# Patient Record
Sex: Female | Born: 1985 | Race: Black or African American | Hispanic: No | Marital: Single | Smoking: Current every day smoker
Health system: Southern US, Community
[De-identification: ages and names within clinical notes are randomized; demographics above are authoritative.]

## PROBLEM LIST (undated history)

## (undated) ENCOUNTER — Ambulatory Visit: Discharge status: Absent without leave | Payer: 59

## (undated) DIAGNOSIS — I10 Essential (primary) hypertension: Secondary | ICD-10-CM

## (undated) DIAGNOSIS — E119 Type 2 diabetes mellitus without complications: Secondary | ICD-10-CM

## (undated) HISTORY — PX: NO PAST SURGERIES: SHX2092

---
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
## 2006-01-12 ENCOUNTER — Emergency Department: Payer: Self-pay | Admitting: Unknown Physician Specialty

## 2006-01-25 ENCOUNTER — Emergency Department: Payer: Self-pay | Admitting: Emergency Medicine

## 2006-01-25 ENCOUNTER — Other Ambulatory Visit: Payer: Self-pay

## 2006-03-05 ENCOUNTER — Emergency Department: Payer: Self-pay | Admitting: Emergency Medicine

## 2006-05-12 ENCOUNTER — Emergency Department (HOSPITAL_COMMUNITY): Admission: EM | Admit: 2006-05-12 | Discharge: 2006-05-12 | Payer: Self-pay | Admitting: Emergency Medicine

## 2006-06-26 ENCOUNTER — Emergency Department (HOSPITAL_COMMUNITY): Admission: EM | Admit: 2006-06-26 | Discharge: 2006-06-26 | Payer: Self-pay | Admitting: Emergency Medicine

## 2006-09-22 ENCOUNTER — Emergency Department (HOSPITAL_COMMUNITY): Admission: EM | Admit: 2006-09-22 | Discharge: 2006-09-22 | Payer: Self-pay | Admitting: Emergency Medicine

## 2007-02-21 ENCOUNTER — Emergency Department (HOSPITAL_COMMUNITY): Admission: EM | Admit: 2007-02-21 | Discharge: 2007-02-21 | Payer: Self-pay | Admitting: Emergency Medicine

## 2007-09-13 ENCOUNTER — Emergency Department (HOSPITAL_COMMUNITY): Admission: EM | Admit: 2007-09-13 | Discharge: 2007-09-13 | Payer: Self-pay | Admitting: Emergency Medicine

## 2007-09-15 ENCOUNTER — Inpatient Hospital Stay (HOSPITAL_COMMUNITY): Admission: AD | Admit: 2007-09-15 | Discharge: 2007-09-15 | Payer: Self-pay | Admitting: Gynecology

## 2007-11-15 ENCOUNTER — Emergency Department (HOSPITAL_COMMUNITY): Admission: EM | Admit: 2007-11-15 | Discharge: 2007-11-16 | Payer: Self-pay | Admitting: Emergency Medicine

## 2007-11-20 ENCOUNTER — Emergency Department (HOSPITAL_COMMUNITY): Admission: EM | Admit: 2007-11-20 | Discharge: 2007-11-20 | Payer: Self-pay | Admitting: Emergency Medicine

## 2008-07-12 ENCOUNTER — Emergency Department (HOSPITAL_COMMUNITY): Admission: EM | Admit: 2008-07-12 | Discharge: 2008-07-12 | Payer: Self-pay | Admitting: Emergency Medicine

## 2008-10-20 ENCOUNTER — Emergency Department (HOSPITAL_COMMUNITY): Admission: EM | Admit: 2008-10-20 | Discharge: 2008-10-20 | Payer: Self-pay | Admitting: Emergency Medicine

## 2009-01-18 ENCOUNTER — Emergency Department (HOSPITAL_BASED_OUTPATIENT_CLINIC_OR_DEPARTMENT_OTHER): Admission: EM | Admit: 2009-01-18 | Discharge: 2009-01-18 | Payer: Self-pay | Admitting: Emergency Medicine

## 2009-01-28 ENCOUNTER — Emergency Department (HOSPITAL_COMMUNITY): Admission: EM | Admit: 2009-01-28 | Discharge: 2009-01-29 | Payer: Self-pay | Admitting: *Deleted

## 2009-02-21 ENCOUNTER — Emergency Department (HOSPITAL_BASED_OUTPATIENT_CLINIC_OR_DEPARTMENT_OTHER): Admission: EM | Admit: 2009-02-21 | Discharge: 2009-02-21 | Payer: Self-pay | Admitting: Emergency Medicine

## 2009-03-19 ENCOUNTER — Emergency Department (HOSPITAL_BASED_OUTPATIENT_CLINIC_OR_DEPARTMENT_OTHER): Admission: EM | Admit: 2009-03-19 | Discharge: 2009-03-19 | Payer: Self-pay | Admitting: Emergency Medicine

## 2009-08-21 ENCOUNTER — Emergency Department (HOSPITAL_BASED_OUTPATIENT_CLINIC_OR_DEPARTMENT_OTHER): Admission: EM | Admit: 2009-08-21 | Discharge: 2009-08-21 | Payer: Self-pay | Admitting: Emergency Medicine

## 2009-11-10 ENCOUNTER — Emergency Department (HOSPITAL_COMMUNITY): Admission: EM | Admit: 2009-11-10 | Discharge: 2009-11-10 | Payer: Self-pay | Admitting: Emergency Medicine

## 2010-02-26 ENCOUNTER — Emergency Department (HOSPITAL_BASED_OUTPATIENT_CLINIC_OR_DEPARTMENT_OTHER): Admission: EM | Admit: 2010-02-26 | Discharge: 2010-02-26 | Payer: Self-pay | Admitting: Emergency Medicine

## 2010-03-31 ENCOUNTER — Emergency Department (HOSPITAL_COMMUNITY): Admission: EM | Admit: 2010-03-31 | Discharge: 2010-04-01 | Payer: Self-pay | Admitting: Emergency Medicine

## 2010-04-03 ENCOUNTER — Emergency Department (HOSPITAL_COMMUNITY): Admission: EM | Admit: 2010-04-03 | Discharge: 2010-04-03 | Payer: Self-pay | Admitting: Emergency Medicine

## 2010-09-12 ENCOUNTER — Ambulatory Visit: Payer: Self-pay | Admitting: Gynecology

## 2010-09-12 ENCOUNTER — Inpatient Hospital Stay (HOSPITAL_COMMUNITY): Admission: AD | Admit: 2010-09-12 | Discharge: 2010-09-12 | Payer: Self-pay | Admitting: Family Medicine

## 2010-09-21 ENCOUNTER — Ambulatory Visit: Payer: Self-pay | Admitting: Nurse Practitioner

## 2010-09-21 ENCOUNTER — Inpatient Hospital Stay (HOSPITAL_COMMUNITY): Admission: AD | Admit: 2010-09-21 | Discharge: 2010-09-21 | Payer: Self-pay | Admitting: Family Medicine

## 2011-01-20 LAB — URINE MICROSCOPIC-ADD ON

## 2011-01-20 LAB — URINALYSIS, ROUTINE W REFLEX MICROSCOPIC
Nitrite: NEGATIVE
Protein, ur: NEGATIVE mg/dL
Urobilinogen, UA: 1 mg/dL (ref 0.0–1.0)

## 2011-01-20 LAB — CBC
Hemoglobin: 8.3 g/dL — ABNORMAL LOW (ref 12.0–15.0)
MCH: 27.9 pg (ref 26.0–34.0)
MCH: 28.3 pg (ref 26.0–34.0)
MCHC: 32.3 g/dL (ref 30.0–36.0)
Platelets: 520 10*3/uL — ABNORMAL HIGH (ref 150–400)
RBC: 2.93 MIL/uL — ABNORMAL LOW (ref 3.87–5.11)
RBC: 3.01 MIL/uL — ABNORMAL LOW (ref 3.87–5.11)

## 2011-01-20 LAB — WET PREP, GENITAL: Yeast Wet Prep HPF POC: NONE SEEN

## 2011-01-20 LAB — GC/CHLAMYDIA PROBE AMP, GENITAL: GC Probe Amp, Genital: NEGATIVE

## 2011-01-23 ENCOUNTER — Emergency Department (HOSPITAL_COMMUNITY)
Admission: EM | Admit: 2011-01-23 | Discharge: 2011-01-23 | Disposition: A | Discharge status: Home / Self Care | Payer: Self-pay | Attending: Emergency Medicine | Admitting: Emergency Medicine

## 2011-01-23 ENCOUNTER — Emergency Department (HOSPITAL_COMMUNITY): Payer: Self-pay

## 2011-01-23 DIAGNOSIS — R059 Cough, unspecified: Secondary | ICD-10-CM | POA: Insufficient documentation

## 2011-01-23 DIAGNOSIS — R05 Cough: Secondary | ICD-10-CM | POA: Insufficient documentation

## 2011-01-23 DIAGNOSIS — J209 Acute bronchitis, unspecified: Secondary | ICD-10-CM | POA: Insufficient documentation

## 2011-01-25 LAB — PREGNANCY, URINE: Preg Test, Ur: NEGATIVE

## 2011-01-26 LAB — DIFFERENTIAL
Lymphocytes Relative: 19 % (ref 12–46)
Lymphs Abs: 1.9 10*3/uL (ref 0.7–4.0)
Monocytes Absolute: 0.9 10*3/uL (ref 0.1–1.0)
Monocytes Relative: 9 % (ref 3–12)
Neutro Abs: 6.8 10*3/uL (ref 1.7–7.7)

## 2011-01-26 LAB — POCT I-STAT, CHEM 8
BUN: 6 mg/dL (ref 6–23)
Calcium, Ion: 1.06 mmol/L — ABNORMAL LOW (ref 1.12–1.32)
Chloride: 105 mEq/L (ref 96–112)
Glucose, Bld: 94 mg/dL (ref 70–99)

## 2011-01-26 LAB — CBC
Hemoglobin: 12.7 g/dL (ref 12.0–15.0)
Hemoglobin: 13.3 g/dL (ref 12.0–15.0)
Platelets: 343 10*3/uL (ref 150–400)
RBC: 4.65 MIL/uL (ref 3.87–5.11)
RDW: 13.7 % (ref 11.5–15.5)

## 2011-01-27 LAB — URINALYSIS, ROUTINE W REFLEX MICROSCOPIC
Bilirubin Urine: NEGATIVE
Glucose, UA: NEGATIVE mg/dL
Hgb urine dipstick: NEGATIVE
Ketones, ur: NEGATIVE mg/dL
Nitrite: NEGATIVE
Protein, ur: NEGATIVE mg/dL
Specific Gravity, Urine: 1.025 (ref 1.005–1.030)
Urobilinogen, UA: 1 mg/dL (ref 0.0–1.0)
pH: 8 (ref 5.0–8.0)

## 2011-01-27 LAB — GC/CHLAMYDIA PROBE AMP, GENITAL
Chlamydia, DNA Probe: NEGATIVE
GC Probe Amp, Genital: NEGATIVE

## 2011-01-27 LAB — URINE MICROSCOPIC-ADD ON

## 2011-01-27 LAB — WET PREP, GENITAL
Trich, Wet Prep: NONE SEEN
Yeast Wet Prep HPF POC: NONE SEEN

## 2011-01-27 LAB — PREGNANCY, URINE: Preg Test, Ur: NEGATIVE

## 2011-01-27 LAB — RPR: RPR Ser Ql: NONREACTIVE

## 2011-02-17 LAB — GC/CHLAMYDIA PROBE AMP, GENITAL
Chlamydia, DNA Probe: NEGATIVE
GC Probe Amp, Genital: NEGATIVE

## 2011-02-17 LAB — URINALYSIS, ROUTINE W REFLEX MICROSCOPIC
Bilirubin Urine: NEGATIVE
Glucose, UA: NEGATIVE mg/dL
Hgb urine dipstick: NEGATIVE
Specific Gravity, Urine: 1.025 (ref 1.005–1.030)
Urobilinogen, UA: 1 mg/dL (ref 0.0–1.0)
pH: 7.5 (ref 5.0–8.0)

## 2011-02-17 LAB — URINE MICROSCOPIC-ADD ON

## 2011-02-17 LAB — RPR: RPR Ser Ql: NONREACTIVE

## 2011-02-18 LAB — URINALYSIS, ROUTINE W REFLEX MICROSCOPIC
Glucose, UA: NEGATIVE mg/dL
Hgb urine dipstick: NEGATIVE
Specific Gravity, Urine: 1.023 (ref 1.005–1.030)
pH: 6 (ref 5.0–8.0)

## 2011-02-19 LAB — RAPID STREP SCREEN (MED CTR MEBANE ONLY): Streptococcus, Group A Screen (Direct): NEGATIVE

## 2011-03-06 ENCOUNTER — Emergency Department (HOSPITAL_COMMUNITY)
Admission: EM | Admit: 2011-03-06 | Discharge: 2011-03-06 | Disposition: A | Discharge status: Home / Self Care | Payer: Self-pay | Attending: Emergency Medicine | Admitting: Emergency Medicine

## 2011-03-06 ENCOUNTER — Emergency Department (HOSPITAL_COMMUNITY): Payer: Self-pay

## 2011-03-06 DIAGNOSIS — R05 Cough: Secondary | ICD-10-CM | POA: Insufficient documentation

## 2011-03-06 DIAGNOSIS — J329 Chronic sinusitis, unspecified: Secondary | ICD-10-CM | POA: Insufficient documentation

## 2011-03-06 DIAGNOSIS — R059 Cough, unspecified: Secondary | ICD-10-CM | POA: Insufficient documentation

## 2011-03-06 DIAGNOSIS — R509 Fever, unspecified: Secondary | ICD-10-CM | POA: Insufficient documentation

## 2011-03-06 DIAGNOSIS — J069 Acute upper respiratory infection, unspecified: Secondary | ICD-10-CM | POA: Insufficient documentation

## 2011-03-06 LAB — POCT PREGNANCY, URINE: Preg Test, Ur: NEGATIVE

## 2011-07-30 LAB — I-STAT 8, (EC8 V) (CONVERTED LAB)
BUN: 8
Bicarbonate: 25 — ABNORMAL HIGH
HCT: 45
Hemoglobin: 15.3 — ABNORMAL HIGH
Operator id: 151321
Sodium: 139
pCO2, Ven: 42.1 — ABNORMAL LOW

## 2011-07-30 LAB — DIFFERENTIAL
Basophils Absolute: 0
Eosinophils Absolute: 0.2
Lymphocytes Relative: 33
Lymphs Abs: 1.6
Neutrophils Relative %: 50

## 2011-07-30 LAB — CBC
MCV: 84.7
Platelets: 380
RDW: 13.5
WBC: 5

## 2011-07-30 LAB — POCT PREGNANCY, URINE: Operator id: 282201

## 2011-08-14 LAB — WET PREP, GENITAL
Trich, Wet Prep: NONE SEEN
Yeast Wet Prep HPF POC: NONE SEEN

## 2011-08-14 LAB — URINALYSIS, ROUTINE W REFLEX MICROSCOPIC
Bilirubin Urine: NEGATIVE
Glucose, UA: NEGATIVE mg/dL
Hgb urine dipstick: NEGATIVE
Ketones, ur: NEGATIVE mg/dL
Nitrite: NEGATIVE
Specific Gravity, Urine: 1.02 (ref 1.005–1.030)
pH: 5.5 (ref 5.0–8.0)

## 2011-08-14 LAB — PREGNANCY, URINE: Preg Test, Ur: NEGATIVE

## 2011-08-14 LAB — GC/CHLAMYDIA PROBE AMP, GENITAL: Chlamydia, DNA Probe: NEGATIVE

## 2011-08-18 LAB — PREGNANCY, URINE: Preg Test, Ur: NEGATIVE

## 2011-11-15 ENCOUNTER — Emergency Department (HOSPITAL_COMMUNITY)
Admission: EM | Admit: 2011-11-15 | Discharge: 2011-11-15 | Disposition: A | Discharge status: Home / Self Care | Payer: Self-pay | Attending: Emergency Medicine | Admitting: Emergency Medicine

## 2011-11-15 ENCOUNTER — Encounter: Payer: Self-pay | Admitting: Emergency Medicine

## 2011-11-15 DIAGNOSIS — J3489 Other specified disorders of nose and nasal sinuses: Secondary | ICD-10-CM | POA: Insufficient documentation

## 2011-11-15 DIAGNOSIS — R197 Diarrhea, unspecified: Secondary | ICD-10-CM | POA: Insufficient documentation

## 2011-11-15 DIAGNOSIS — R1110 Vomiting, unspecified: Secondary | ICD-10-CM | POA: Insufficient documentation

## 2011-11-15 DIAGNOSIS — R5381 Other malaise: Secondary | ICD-10-CM | POA: Insufficient documentation

## 2011-11-15 DIAGNOSIS — R5383 Other fatigue: Secondary | ICD-10-CM | POA: Insufficient documentation

## 2011-11-15 DIAGNOSIS — H9209 Otalgia, unspecified ear: Secondary | ICD-10-CM | POA: Insufficient documentation

## 2011-11-15 DIAGNOSIS — R51 Headache: Secondary | ICD-10-CM | POA: Insufficient documentation

## 2011-11-15 DIAGNOSIS — R05 Cough: Secondary | ICD-10-CM | POA: Insufficient documentation

## 2011-11-15 DIAGNOSIS — R059 Cough, unspecified: Secondary | ICD-10-CM | POA: Insufficient documentation

## 2011-11-15 DIAGNOSIS — R109 Unspecified abdominal pain: Secondary | ICD-10-CM | POA: Insufficient documentation

## 2011-11-15 DIAGNOSIS — R6889 Other general symptoms and signs: Secondary | ICD-10-CM | POA: Insufficient documentation

## 2011-11-15 DIAGNOSIS — R000 Tachycardia, unspecified: Secondary | ICD-10-CM | POA: Insufficient documentation

## 2011-11-15 DIAGNOSIS — R070 Pain in throat: Secondary | ICD-10-CM | POA: Insufficient documentation

## 2011-11-15 DIAGNOSIS — R509 Fever, unspecified: Secondary | ICD-10-CM | POA: Insufficient documentation

## 2011-11-15 DIAGNOSIS — R079 Chest pain, unspecified: Secondary | ICD-10-CM | POA: Insufficient documentation

## 2011-11-15 DIAGNOSIS — IMO0001 Reserved for inherently not codable concepts without codable children: Secondary | ICD-10-CM | POA: Insufficient documentation

## 2011-11-15 MED ORDER — SALINE NASAL SPRAY 0.65 % NA SOLN: 1.0000 | NASAL | Status: DC | PRN

## 2011-11-15 MED ORDER — HYDROCODONE-HOMATROPINE 5-1.5 MG/5ML PO SYRP: 5.0000 mL | ORAL_SOLUTION | Freq: Four times a day (QID) | ORAL | Status: AC | PRN

## 2011-11-15 NOTE — ED Notes (Signed)
PT. REPORTS NASAL CONGESTION , PRODUCTIVE COUGH AND ABDOMINAL CRAMPING FOR 1 WEEK.

## 2011-11-15 NOTE — ED Provider Notes (Signed)
History     CSN: [id number]  Arrival date & time 11/15/11  2128   First MD Initiated Contact with Patient 11/15/11 2243      Chief Complaint  Patient presents with  . Nasal Congestion    (Consider location/radiation/quality/duration/timing/severity/associated sxs/prior treatment) HPI Comments: Pt presents to the ED with complaints of flu-like symptoms of cough, congestion, sore throat, muscle aches, chills, fevers, ear pain, headaches, abdominal pain, vomiting, diarrhea. The patient states that the symptoms started 1 week ago.  Pt did not get the flu shot this year. The patient denies headaches, neck pain, weakness, vision changes, severe abdominal pain, inability to eat or drink, difficulty breathing, SOB, wheezing, chest pain. The patient has tried cough medicine, NSAIDS, and rest but has only felt mild relief.    The history is provided by the patient.    History reviewed. No pertinent past medical history.  History reviewed. No pertinent past surgical history.  No family history on file.  History  Substance Use Topics  . Smoking status: Current Everyday Smoker  . Smokeless tobacco: Not on file  . Alcohol Use: Yes    OB History    Grav Para Term Preterm Abortions TAB SAB Ect Mult Living                  Review of Systems  Constitutional: Positive for fever, chills and fatigue.  HENT: Positive for congestion and rhinorrhea. Negative for ear pain, sore throat, sneezing, neck pain, neck stiffness, sinus pressure and tinnitus.   Eyes: Negative for visual disturbance.  Respiratory: Positive for cough and chest tightness.   Cardiovascular: Negative for chest pain and palpitations.  Gastrointestinal: Negative for nausea, vomiting, abdominal pain and diarrhea.  Genitourinary: Negative for dysuria.  Musculoskeletal: Positive for myalgias.  Skin: Negative for color change and rash.  Neurological: Negative for dizziness and weakness.  Hematological: Does not bruise/bleed  easily.  Psychiatric/Behavioral: Negative for confusion.  All other systems reviewed and are negative.    Allergies  Review of patient's allergies indicates no known allergies.  Home Medications   Current Outpatient Rx  Name Route Sig Dispense Refill  . GUAIFENESIN ER 1200 MG PO TB12 Oral Take 1 tablet by mouth daily as needed. For chest congestion       BP 151/87  Pulse 106  Temp(Src) 98.3 F (36.8 C) (Oral)  Resp 18  SpO2 98%  LMP 10/15/2011  Physical Exam  Nursing note and vitals reviewed. Constitutional: She is oriented to person, place, and time. She appears well-nourished. No distress.  HENT:  Head: Normocephalic and atraumatic. No trismus in the jaw.  Right Ear: External ear normal. No drainage or tenderness. No mastoid tenderness.  Left Ear: External ear normal. No drainage or tenderness. No mastoid tenderness.  Nose: Nose normal. No rhinorrhea or sinus tenderness.  Mouth/Throat: Uvula is midline, oropharynx is clear and moist and mucous membranes are normal. No uvula swelling. No oropharyngeal exudate.  Eyes: Conjunctivae and EOM are normal. Right eye exhibits no discharge. Left eye exhibits no discharge. No scleral icterus.  Neck: Normal range of motion. Neck supple.  Cardiovascular: Regular rhythm and normal heart sounds.  Tachycardia present.   Pulmonary/Chest: Effort normal and breath sounds normal. No stridor. No respiratory distress. She has no wheezes. She exhibits tenderness.  Abdominal: Soft. Normal appearance and bowel sounds are normal. There is no tenderness.  Musculoskeletal: Normal range of motion.  Neurological: She is alert and oriented to person, place, and time.  Skin: Skin is  warm and dry. No rash noted. She is not diaphoretic.  Psychiatric: She has a normal mood and affect. Her behavior is normal.    ED Course  Procedures (including critical care time)  Labs Reviewed - No data to display No results found.   No diagnosis found.  Pt  slightly tachycardic at triage. Nurse rechecking pts vitals. Pt HR at re-check is 94.   MDM  Flu like symptoms  Patient presents with flulike symptoms.  Due to patient's presentation and physical exam a chest x-ray was not ordered bc likely diagnosis of flu.  Discussed the cost versus benefit of Tamiflu treatment with the patient.  The patient understands that symptoms are greater than the recommended 24-48 hour window of treatment.  Patient will be discharged with instructions to orally hydrate, rest, and use over-the-counter medications such as anti-inflammatories ibuprofen and Aleve for muscle aches and Tylenol for fever.  Patient will also be given a cough suppressant.       [place], [place] 11/15/11 2307

## 2011-11-18 NOTE — ED Provider Notes (Signed)
Medical screening examination/treatment/procedure(s) were performed by non-physician practitioner and as supervising physician I was immediately available for consultation/collaboration.    Mckinley Olheiser L Miquan Tandon, MD 11/18/11 0600 

## 2011-11-30 ENCOUNTER — Emergency Department (HOSPITAL_COMMUNITY): Payer: Self-pay

## 2011-11-30 ENCOUNTER — Emergency Department (HOSPITAL_COMMUNITY)
Admission: EM | Admit: 2011-11-30 | Discharge: 2011-12-01 | Disposition: A | Discharge status: Home / Self Care | Payer: Self-pay | Attending: Emergency Medicine | Admitting: Emergency Medicine

## 2011-11-30 ENCOUNTER — Encounter (HOSPITAL_COMMUNITY): Payer: Self-pay | Admitting: *Deleted

## 2011-11-30 DIAGNOSIS — J111 Influenza due to unidentified influenza virus with other respiratory manifestations: Secondary | ICD-10-CM | POA: Insufficient documentation

## 2011-11-30 DIAGNOSIS — R05 Cough: Secondary | ICD-10-CM | POA: Insufficient documentation

## 2011-11-30 DIAGNOSIS — R059 Cough, unspecified: Secondary | ICD-10-CM | POA: Insufficient documentation

## 2011-11-30 DIAGNOSIS — R062 Wheezing: Secondary | ICD-10-CM | POA: Insufficient documentation

## 2011-11-30 DIAGNOSIS — R0989 Other specified symptoms and signs involving the circulatory and respiratory systems: Secondary | ICD-10-CM | POA: Insufficient documentation

## 2011-11-30 DIAGNOSIS — R509 Fever, unspecified: Secondary | ICD-10-CM | POA: Insufficient documentation

## 2011-11-30 DIAGNOSIS — R6889 Other general symptoms and signs: Secondary | ICD-10-CM | POA: Insufficient documentation

## 2011-11-30 DIAGNOSIS — I10 Essential (primary) hypertension: Secondary | ICD-10-CM | POA: Insufficient documentation

## 2011-11-30 HISTORY — DX: Essential (primary) hypertension: I10

## 2011-11-30 NOTE — ED Notes (Signed)
The pt has been coughing for one week and she was seen here last week for the same.  For 2 days she has chest congestion  And drainage from her nose.

## 2011-12-01 MED ORDER — ALBUTEROL SULFATE HFA 108 (90 BASE) MCG/ACT IN AERS
2.0000 | INHALATION_SPRAY | Freq: Once | RESPIRATORY_TRACT | Status: AC
  Administered 2011-12-01: 2 via RESPIRATORY_TRACT
  Filled 2011-12-01: qty 6.7

## 2011-12-01 NOTE — ED Provider Notes (Signed)
History     CSN: [id number]  Arrival date & time 11/30/11  2219   First MD Initiated Contact with Patient 12/01/11 0132      Chief Complaint  Patient presents with  . Influenza    (Consider location/radiation/quality/duration/timing/severity/associated sxs/prior treatment) HPI Comments: Patient is a 26 year old female with no significant past medical history other than mild hypertension who presents approximately one week after being seen for a cough and chest congestion. She was initially diagnosed with a flulike illness. She states that for the last 2 days she has had persistent cough and chest congestion and a clear runny watery drainage from her nose. She states she has had subjective fevers and chills but denies nausea vomiting back pain belly pain lower extremity swelling, rash, headache. Symptoms are persistent, moderate, nothing makes better or worse.  Patient is a 26 y.o. female presenting with flu symptoms. The history is provided by the patient and a friend.  Influenza    Past Medical History  Diagnosis Date  . Hypertension     History reviewed. No pertinent past surgical history.  History reviewed. No pertinent family history.  History  Substance Use Topics  . Smoking status: Current Everyday Smoker  . Smokeless tobacco: Not on file  . Alcohol Use: Yes    OB History    Grav Para Term Preterm Abortions TAB SAB Ect Mult Living                  Review of Systems  All other systems reviewed and are negative.    Allergies  Review of patient's allergies indicates no known allergies.  Home Medications   Current Outpatient Rx  Name Route Sig Dispense Refill  . DM-PHENYLEPHRINE-ACETAMINOPHEN 10-5-325 MG/15ML PO LIQD Oral Take 15 mLs by mouth every 4 (four) hours as needed. For fever.    . SODIUM & POTASSIUM BICARBONATE PO TBEF Oral Take 1 tablet by mouth daily as needed. For cold.      BP 144/94  Pulse 103  Temp(Src) 98.3 F (36.8 C) (Oral)  Resp 20   SpO2 100%  LMP 11/23/2011  Physical Exam  Nursing note and vitals reviewed. Constitutional: She appears well-developed and well-nourished. No distress.  HENT:  Head: Normocephalic and atraumatic.  Mouth/Throat: No oropharyngeal exudate.       Clear rhinorrhea in the bilateral nostrils, no turbinate swelling.  Pharynx erythematous but no asymmetry, hypertrophy, exudate over the tonsils  Eyes: Conjunctivae and EOM are normal. Pupils are equal, round, and reactive to light. Right eye exhibits no discharge. Left eye exhibits no discharge. No scleral icterus.  Neck: Normal range of motion. Neck supple. No JVD present. No thyromegaly present.  Cardiovascular: Normal rate, regular rhythm, normal heart sounds and intact distal pulses.  Exam reveals no gallop and no friction rub.   No murmur heard. Pulmonary/Chest: Effort normal. No respiratory distress. She has wheezes ( slight end expiratory wheeze, no respiratory distress, increased work of breathing or tachypnea.). She has no rales.  Abdominal: Soft. Bowel sounds are normal. She exhibits no distension and no mass. There is no tenderness.  Musculoskeletal: Normal range of motion. She exhibits no edema and no tenderness.  Lymphadenopathy:    She has no cervical adenopathy.  Neurological: She is alert. Coordination normal.  Skin: Skin is warm and dry. No rash noted. No erythema.  Psychiatric: She has a normal mood and affect. Her behavior is normal.    ED Course  Procedures (including critical care time)  Labs Reviewed -  No data to display Dg Chest 2 View  11/30/2011  *RADIOLOGY REPORT*  Clinical Data: Cough, shortness of breath and chest pain.  CHEST - 2 VIEW  Comparison: PA and lateral chest 03/06/2011.  Findings: Lungs are clear.  Heart size is normal.  No pneumothorax or effusion.  Mild scoliosis noted.  IMPRESSION: No acute finding.  Original Report Authenticated By: Bernadene Bell. D'ALESSIO, M.D.     1. Influenza       MDM  Overall  the patient is very well-appearing, has vital signs which were normal including oxygen saturation 100% on room air I evaluate her. She is afebrile has no tachycardia and respirations of 18.  Chest x-ray according to my view and the radiologist's read shows no acute findings. Her symptoms and presentation is consistent with influenza. She otherwise is healthy, is able to take fluids and food by mouth without vomiting and can followup with her family Dr.  Patient has been given an albuterol MDI treatment in the emergency department and has been discharged with this medicine to use at home. I have given her specific instructions on its use. She agrees to followup should her symptoms worsen.        Vida Roller, MD 12/01/11 [phone]

## 2014-01-08 ENCOUNTER — Encounter (HOSPITAL_COMMUNITY): Payer: Self-pay | Admitting: Emergency Medicine

## 2014-01-08 ENCOUNTER — Emergency Department (HOSPITAL_COMMUNITY): Payer: Medicaid Other

## 2014-01-08 ENCOUNTER — Emergency Department (HOSPITAL_COMMUNITY)
Admission: EM | Admit: 2014-01-08 | Discharge: 2014-01-09 | Disposition: A | Discharge status: Home / Self Care | Payer: Medicaid Other | Attending: Emergency Medicine | Admitting: Emergency Medicine

## 2014-01-08 DIAGNOSIS — I10 Essential (primary) hypertension: Secondary | ICD-10-CM | POA: Insufficient documentation

## 2014-01-08 DIAGNOSIS — F17200 Nicotine dependence, unspecified, uncomplicated: Secondary | ICD-10-CM | POA: Insufficient documentation

## 2014-01-08 DIAGNOSIS — Z3202 Encounter for pregnancy test, result negative: Secondary | ICD-10-CM | POA: Insufficient documentation

## 2014-01-08 DIAGNOSIS — R079 Chest pain, unspecified: Secondary | ICD-10-CM | POA: Insufficient documentation

## 2014-01-08 LAB — POC URINE PREG, ED: PREG TEST UR: NEGATIVE

## 2014-01-08 MED ORDER — GI COCKTAIL ~~LOC~~
30.0000 mL | Freq: Once | ORAL | Status: AC
  Administered 2014-01-09: 30 mL via ORAL
  Filled 2014-01-08: qty 30

## 2014-01-08 NOTE — ED Notes (Signed)
My heart and chest has been hurting per pt. Some times I feel weak, it feels like it is "leaking" per pt. It just feels wrong per pt. Patient denies any other symptoms. Patient states that it has been doing this for a week.

## 2014-01-09 NOTE — ED Provider Notes (Signed)
Patient seen/examined in the Emergency Department in conjunction with Midlevel Provider [place] Patient reports chest pain Exam : awake/alert, watching TV, no murmurs/rubs/gallops noted Plan: EKG/CXR.  I doubt ACS/PE at this time    Sharyon Cable, MD 01/09/14 [phone]

## 2014-01-09 NOTE — Discharge Instructions (Signed)

## 2014-01-17 NOTE — ED Provider Notes (Signed)
Medical screening examination/treatment/procedure(s) were conducted as a shared visit with non-physician practitioner(s) and myself.  I personally evaluated the patient during the encounter.   EKG Interpretation   Date/Time:  Monday January 08 2014 20:52:54 EST Ventricular Rate:  103 PR Interval:  136 QRS Duration: 72 QT Interval:  348 QTC Calculation: 455 R Axis:   25 Text Interpretation:  Sinus tachycardia Possible Left atrial enlargement  Borderline ECG When compared with ECG of 13-Sep-2007 01:11, No significant  change was found Confirmed by Kenna Gilbert, KRISTEN [phone]) on 01/08/2014  9:06:48 PM        Sharyon Cable, MD 01/17/14 1918

## 2014-01-17 NOTE — ED Provider Notes (Signed)
CSN: [id number]     Arrival date & time 01/08/14  2051 History   First MD Initiated Contact with Patient 01/08/14 2317     Chief Complaint  Patient presents with  . Chest Pain     (Consider location/radiation/quality/duration/timing/severity/associated sxs/prior Treatment) HPI Comments: Mallory Mcdowell is a 28 y.o. female who presents to the Emergency Department complaining of chest pain for one week.  She describes the pain as "feels like my heart is leaking".  When asked patient to further describe this sensation, she only describes as "dripping or leaking" feeling to the left lateral chest.  She states the sensation is worse after eating.  She denies increased pain with deep breathing or movement.  She also denies vomiting, abdominal pain, shortness of breath or arm pain.  States symptoms were gradual in onset.  Nothing makes it better.  She has not tried any therapies or medications prior to ED visit.   The history is provided by the patient.    Past Medical History  Diagnosis Date  . Hypertension    History reviewed. No pertinent past surgical history. History reviewed. No pertinent family history. History  Substance Use Topics  . Smoking status: Current Every Day Smoker  . Smokeless tobacco: Not on file  . Alcohol Use: Yes   OB History   Grav Para Term Preterm Abortions TAB SAB Ect Mult Living                 Review of Systems  Constitutional: Negative for fever, chills and fatigue.  HENT: Negative for sore throat and trouble swallowing.   Respiratory: Negative for cough, chest tightness, shortness of breath, wheezing and stridor.   Cardiovascular: Positive for chest pain. Negative for palpitations.  Gastrointestinal: Negative for nausea, vomiting, abdominal pain and blood in stool.  Genitourinary: Negative for dysuria, hematuria and flank pain.  Musculoskeletal: Negative for arthralgias, back pain, myalgias, neck pain and neck stiffness.  Skin: Negative for rash.   Neurological: Negative for dizziness, weakness and numbness.  Hematological: Does not bruise/bleed easily.  All other systems reviewed and are negative.      Allergies  Review of patient's allergies indicates no known allergies.  Home Medications   Current Outpatient Rx  Name  Route  Sig  Dispense  Refill  . DM-Phenylephrine-Acetaminophen (TYLENOL COLD MULTI-SYMPTOM) 10-5-325 MG/15ML LIQD   Oral   Take 15 mLs by mouth every 4 (four) hours as needed. For fever.         . sodium-potassium bicarbonate (ALKA-SELTZER GOLD) TBEF   Oral   Take 1 tablet by mouth daily as needed. For cold.          BP 107/71  Pulse 90  Temp(Src) 97.8 F (36.6 C) (Oral)  Resp 22  Ht 5\' 4"  (1.626 m)  Wt 200 lb (90.719 kg)  BMI 34.31 kg/m2  SpO2 100%  LMP 12/17/2013 Physical Exam  Nursing note and vitals reviewed. Constitutional: She is oriented to person, place, and time. She appears well-developed and well-nourished. No distress.  HENT:  Head: Normocephalic and atraumatic.  Mouth/Throat: Oropharynx is clear and moist.  Neck: Normal range of motion. Neck supple.  Cardiovascular: Normal rate, regular rhythm, normal heart sounds and intact distal pulses.   No murmur heard. Pulmonary/Chest: Effort normal and breath sounds normal. No respiratory distress. She has no wheezes. She has no rales. She exhibits no tenderness.  Abdominal: Soft. She exhibits no distension. There is no tenderness. There is no rebound and no guarding.  Musculoskeletal: Normal range of motion. She exhibits no tenderness.  Lymphadenopathy:    She has no cervical adenopathy.  Neurological: She is alert and oriented to person, place, and time. She exhibits normal muscle tone. Coordination normal.  Skin: Skin is warm and dry.    ED Course  Procedures (including critical care time) Labs Review Labs Reviewed  POC URINE PREG, ED   Imaging Review No results found.   EKG Interpretation   Date/Time:  Monday January 08 2014 20:52:54 EST Ventricular Rate:  103 PR Interval:  136 QRS Duration: 72 QT Interval:  348 QTC Calculation: 455 R Axis:   25 Text Interpretation:  Sinus tachycardia Possible Left atrial enlargement  Borderline ECG When compared with ECG of 13-Sep-2007 01:11, No significant  change was found Confirmed by WARD,  DO, KRISTEN ([id number]) on 01/08/2014  9:06:48 PM      MDM   Final diagnoses:  Chest pain    Patient is well appearing.  NAD.  VSS.  No concerning sx's for ACS or PE.  Sx's improved after GI cocktail.   Probable GERD.  Pt hx discussed with EDP.  Patient appears stable for d/c and agrees to f/u with PMD, given referral for [facility] medicine  The patient appears reasonably screened and/or stabilized for discharge and I doubt any other medical condition or other [facility] requiring further screening, evaluation, or treatment in the ED at this time prior to discharge.     Kaytlynne Neace L. Vanessa Hardwick, PA-C 01/17/14 1715

## 2014-04-11 ENCOUNTER — Emergency Department (HOSPITAL_BASED_OUTPATIENT_CLINIC_OR_DEPARTMENT_OTHER)
Admission: EM | Admit: 2014-04-11 | Discharge: 2014-04-12 | Disposition: A | Discharge status: Home / Self Care | Payer: Medicaid Other | Attending: Emergency Medicine | Admitting: Emergency Medicine

## 2014-04-11 ENCOUNTER — Encounter (HOSPITAL_BASED_OUTPATIENT_CLINIC_OR_DEPARTMENT_OTHER): Payer: Self-pay | Admitting: Emergency Medicine

## 2014-04-11 DIAGNOSIS — Z3202 Encounter for pregnancy test, result negative: Secondary | ICD-10-CM | POA: Insufficient documentation

## 2014-04-11 DIAGNOSIS — R011 Cardiac murmur, unspecified: Secondary | ICD-10-CM | POA: Insufficient documentation

## 2014-04-11 DIAGNOSIS — N83209 Unspecified ovarian cyst, unspecified side: Secondary | ICD-10-CM | POA: Insufficient documentation

## 2014-04-11 DIAGNOSIS — F17200 Nicotine dependence, unspecified, uncomplicated: Secondary | ICD-10-CM | POA: Insufficient documentation

## 2014-04-11 DIAGNOSIS — N898 Other specified noninflammatory disorders of vagina: Secondary | ICD-10-CM | POA: Insufficient documentation

## 2014-04-11 DIAGNOSIS — I10 Essential (primary) hypertension: Secondary | ICD-10-CM | POA: Insufficient documentation

## 2014-04-11 DIAGNOSIS — Z79899 Other long term (current) drug therapy: Secondary | ICD-10-CM | POA: Insufficient documentation

## 2014-04-11 LAB — URINALYSIS, ROUTINE W REFLEX MICROSCOPIC
Bilirubin Urine: NEGATIVE
GLUCOSE, UA: NEGATIVE mg/dL
Hgb urine dipstick: NEGATIVE
Ketones, ur: NEGATIVE mg/dL
LEUKOCYTES UA: NEGATIVE
NITRITE: NEGATIVE
PH: 6 (ref 5.0–8.0)
Protein, ur: NEGATIVE mg/dL
SPECIFIC GRAVITY, URINE: 1.027 (ref 1.005–1.030)
Urobilinogen, UA: 1 mg/dL (ref 0.0–1.0)

## 2014-04-11 LAB — PREGNANCY, URINE: PREG TEST UR: NEGATIVE

## 2014-04-11 NOTE — ED Provider Notes (Signed)
CSN: [id number]     Arrival date & time 04/11/14  2221 History   This chart was scribed for Merryl Hacker, MD, by Neta Ehlers, ED Scribe. This patient was seen in room MH12/MH12 and the patient's care was started at 11:35 PM.  First MD Initiated Contact with Patient 04/11/14 2328     Chief Complaint  Patient presents with  . Vaginal Discharge    The history is provided by the patient. No language interpreter was used.   HPI Comments: Mallory Mcdowell is a 28 y.o. female who presents to the Emergency Department complaining of pelvic pain which has persisted for several weeks. She states the pain radiates to her left abdomen; she rates the pain as 7/10. She has used Advil with minimal relief. She denies vaginal pain, vaginal discharge, pain with intercourse, or dysuria.  Her last menses was two weeks ago, she believes. Mallory Mcdowell is sexually active, but she does not use protection. The pt states a h/o heart murmur. She denies daily medications.   Past Medical History  Diagnosis Date  . Hypertension    History reviewed. No pertinent past surgical history. History reviewed. No pertinent family history. History  Substance Use Topics  . Smoking status: Current Every Day Smoker -- 0.50 packs/day    Types: Cigarettes  . Smokeless tobacco: Not on file  . Alcohol Use: Yes   No OB history provided.  Review of Systems  Constitutional: Negative for fever.  Respiratory: Negative for chest tightness and shortness of breath.   Cardiovascular: Negative for chest pain.  Gastrointestinal: Positive for abdominal pain. Negative for nausea and vomiting.  Genitourinary: Positive for vaginal discharge and pelvic pain. Negative for dysuria.  Neurological: Negative for headaches.  All other systems reviewed and are negative.     Allergies  Review of patient's allergies indicates no known allergies.  Home Medications   Prior to Admission medications   Medication Sig Start Date End Date  Taking? Authorizing Provider  DM-Phenylephrine-Acetaminophen (TYLENOL COLD MULTI-SYMPTOM) 10-5-325 MG/15ML LIQD Take 15 mLs by mouth every 4 (four) hours as needed. For fever.    Historical Provider, MD  HYDROcodone-acetaminophen (NORCO/VICODIN) 5-325 MG per tablet Take 1 tablet by mouth every 6 (six) hours as needed for moderate pain or severe pain. 04/12/14   Merryl Hacker, MD  ibuprofen (ADVIL,MOTRIN) 600 MG tablet Take 1 tablet (600 mg total) by mouth every 6 (six) hours as needed. 04/12/14   Merryl Hacker, MD  sodium-potassium bicarbonate (ALKA-SELTZER GOLD) TBEF Take 1 tablet by mouth daily as needed. For cold.    Historical Provider, MD   Triage Vitals: BP 139/89  Pulse 102  Temp(Src) 98.2 F (36.8 C)  Resp 16  Ht 5\' 4"  (1.626 m)  Wt 218 lb (98.884 kg)  BMI 37.40 kg/m2  SpO2 100%  LMP 03/28/2014  Physical Exam  Nursing note and vitals reviewed. Constitutional: She is oriented to person, place, and time. She appears well-developed and well-nourished. No distress.  HENT:  Head: Normocephalic and atraumatic.  Cardiovascular: Normal rate and regular rhythm.   Pulmonary/Chest: Effort normal. No respiratory distress.  Abdominal: Soft. There is no tenderness. There is no rebound and no guarding.  Genitourinary:  Normal external vaginal exam, scant white vaginal discharge in the vault, no cervical motion tenderness, left adnexal tenderness noted without mass  Neurological: She is alert and oriented to person, place, and time.  Skin: Skin is warm and dry.  Psychiatric: She has a normal mood and affect.  ED Course  Procedures (including critical care time)  DIAGNOSTIC STUDIES: Oxygen Saturation is 100% on room air, normal by my interpretation.    COORDINATION OF CARE:  11:39 PM- Discussed treatment plan with patient, and the patient agreed to the plan. The plan includes a pelvic exam and labs.   Labs Review Labs Reviewed  WET PREP, GENITAL - Abnormal; Notable for the  following:    Clue Cells Wet Prep HPF POC FEW (*)    WBC, Wet Prep HPF POC FEW (*)    All other components within normal limits  GC/CHLAMYDIA PROBE AMP  URINALYSIS, ROUTINE W REFLEX MICROSCOPIC  PREGNANCY, URINE  RPR  HIV ANTIBODY (ROUTINE TESTING)    Imaging Review US Transvaginal Non-ob  04/12/2014   CLINICAL DATA:  Left adnexal pain.  Vaginal discharge.  EXAM: TRANSABDOMINAL AND TRANSVAGINAL ULTRASOUND OF PELVIS  TECHNIQUE: Both transabdominal and transvaginal ultrasound examinations of the pelvis were performed. Transabdominal technique was performed for global imaging of the pelvis including uterus, ovaries, adnexal regions, and pelvic cul-de-sac. It was necessary to proceed with endovaginal exam following the transabdominal exam to visualize the adnexa and endometrium.  COMPARISON:  09/12/2010  FINDINGS: Uterus  Measurements: 10 x 6 x 9 cm. There is a heterogeneous, partially shadowing mass in the right and posterior aspect of the uterine body, measuring 6 cm in maximal diameter. This is larger than in 2011 when it measured 2.7 cm.  Endometrium  Thickness: 9 mm.  No focal abnormality visualized.  Right ovary  Measurements: 3.2 x 2.1 x 3.3 cm. Normal appearance/no adnexal mass.  Left ovary  Measurements: 5.6 x 3.3 x 6.1 cm. The size discrepancy is related to a 5 x 3 x 5 cm predominantly hypoechoic intra-ovarian mass with occasional, nonvascular septations, some mildly thick.  Other findings  No significant free pelvic fluid  IMPRESSION: 1. 3 x 5 x 5 cm left ovarian mass, most likely a hemorrhagic cyst. Short-interval follow up ultrasound in 6-12 weeks is recommended to ensure clearing, preferably during the week following the patient's normal menses. 2. 6 cm uterine fibroid.   Electronically Signed   By: Jorje Guild M.D.   On: 04/12/2014 01:16   US Pelvis Complete  04/12/2014   CLINICAL DATA:  Left adnexal pain.  Vaginal discharge.  EXAM: TRANSABDOMINAL AND TRANSVAGINAL ULTRASOUND OF PELVIS   TECHNIQUE: Both transabdominal and transvaginal ultrasound examinations of the pelvis were performed. Transabdominal technique was performed for global imaging of the pelvis including uterus, ovaries, adnexal regions, and pelvic cul-de-sac. It was necessary to proceed with endovaginal exam following the transabdominal exam to visualize the adnexa and endometrium.  COMPARISON:  09/12/2010  FINDINGS: Uterus  Measurements: 10 x 6 x 9 cm. There is a heterogeneous, partially shadowing mass in the right and posterior aspect of the uterine body, measuring 6 cm in maximal diameter. This is larger than in 2011 when it measured 2.7 cm.  Endometrium  Thickness: 9 mm.  No focal abnormality visualized.  Right ovary  Measurements: 3.2 x 2.1 x 3.3 cm. Normal appearance/no adnexal mass.  Left ovary  Measurements: 5.6 x 3.3 x 6.1 cm. The size discrepancy is related to a 5 x 3 x 5 cm predominantly hypoechoic intra-ovarian mass with occasional, nonvascular septations, some mildly thick.  Other findings  No significant free pelvic fluid  IMPRESSION: 1. 3 x 5 x 5 cm left ovarian mass, most likely a hemorrhagic cyst. Short-interval follow up ultrasound in 6-12 weeks is recommended to ensure clearing, preferably during  the week following the patient's normal menses. 2. 6 cm uterine fibroid.   Electronically Signed   By: Jorje Guild M.D.   On: 04/12/2014 01:16     EKG Interpretation None      MDM   Final diagnoses:  Hemorrhagic ovarian cyst    Patient presents with pelvic pain and vaginal pain. She is nontoxic on exam. Pelvic exam is notable for left adnexal tenderness. Patient was screened for STDs and empirically treated with Rocephin and azithromycin.  Ultrasound shows a left ovarian mass likely hemorrhagic cyst. Recommended 6-12 weeks followup ultrasound for resolution. Feel this is likely the cause of the patient's symptoms. Patient will be discharged home on pain medication. She will be referred to [facility]  outpatient clinic for followup. Patient was given strict return precautions.  After history, exam, and medical workup I feel the patient has been appropriately medically screened and is safe for discharge home. Pertinent diagnoses were discussed with the patient. Patient was given return precautions.   I personally performed the services described in this documentation, which was scribed in my presence. The recorded information has been reviewed and is accurate.     Merryl Hacker, MD 04/12/14 [phone]

## 2014-04-11 NOTE — ED Notes (Signed)
Pt c/o vaginal discharge x 3 days

## 2014-04-11 NOTE — ED Notes (Signed)
Pelvic cart at bedside. 

## 2014-04-12 ENCOUNTER — Emergency Department (HOSPITAL_BASED_OUTPATIENT_CLINIC_OR_DEPARTMENT_OTHER): Payer: Medicaid Other

## 2014-04-12 LAB — GC/CHLAMYDIA PROBE AMP
CT PROBE, AMP APTIMA: NEGATIVE
GC PROBE AMP APTIMA: NEGATIVE

## 2014-04-12 LAB — HIV ANTIBODY (ROUTINE TESTING W REFLEX): HIV 1&2 Ab, 4th Generation: NONREACTIVE

## 2014-04-12 LAB — RPR

## 2014-04-12 LAB — WET PREP, GENITAL
Trich, Wet Prep: NONE SEEN
Yeast Wet Prep HPF POC: NONE SEEN

## 2014-04-12 MED ORDER — CEFTRIAXONE SODIUM 250 MG IJ SOLR
250.0000 mg | Freq: Once | INTRAMUSCULAR | Status: AC
  Administered 2014-04-12: 250 mg via INTRAMUSCULAR
  Filled 2014-04-12: qty 250

## 2014-04-12 MED ORDER — AZITHROMYCIN 250 MG PO TABS
1000.0000 mg | ORAL_TABLET | Freq: Once | ORAL | Status: AC
  Administered 2014-04-12: 1000 mg via ORAL
  Filled 2014-04-12: qty 4

## 2014-04-12 MED ORDER — HYDROCODONE-ACETAMINOPHEN 5-325 MG PO TABS: 1.0000 | ORAL_TABLET | Freq: Four times a day (QID) | ORAL | Status: DC | PRN

## 2014-04-12 MED ORDER — IBUPROFEN 600 MG PO TABS: 600.0000 mg | ORAL_TABLET | Freq: Four times a day (QID) | ORAL | Status: DC | PRN

## 2014-04-12 NOTE — Discharge Instructions (Signed)

## 2014-08-28 ENCOUNTER — Encounter (HOSPITAL_COMMUNITY): Payer: Self-pay | Admitting: Emergency Medicine

## 2014-08-28 ENCOUNTER — Emergency Department (HOSPITAL_COMMUNITY)
Admission: EM | Admit: 2014-08-28 | Discharge: 2014-08-28 | Disposition: A | Discharge status: Home / Self Care | Payer: Medicaid Other | Attending: Emergency Medicine | Admitting: Emergency Medicine

## 2014-08-28 DIAGNOSIS — Z720 Tobacco use: Secondary | ICD-10-CM | POA: Insufficient documentation

## 2014-08-28 DIAGNOSIS — Z3202 Encounter for pregnancy test, result negative: Secondary | ICD-10-CM | POA: Insufficient documentation

## 2014-08-28 DIAGNOSIS — E119 Type 2 diabetes mellitus without complications: Secondary | ICD-10-CM | POA: Insufficient documentation

## 2014-08-28 DIAGNOSIS — Z79899 Other long term (current) drug therapy: Secondary | ICD-10-CM | POA: Insufficient documentation

## 2014-08-28 DIAGNOSIS — R102 Pelvic and perineal pain: Secondary | ICD-10-CM | POA: Insufficient documentation

## 2014-08-28 DIAGNOSIS — I10 Essential (primary) hypertension: Secondary | ICD-10-CM | POA: Insufficient documentation

## 2014-08-28 DIAGNOSIS — N83201 Unspecified ovarian cyst, right side: Secondary | ICD-10-CM

## 2014-08-28 DIAGNOSIS — N830 Follicular cyst of ovary: Secondary | ICD-10-CM | POA: Insufficient documentation

## 2014-08-28 DIAGNOSIS — E669 Obesity, unspecified: Secondary | ICD-10-CM | POA: Insufficient documentation

## 2014-08-28 LAB — COMPREHENSIVE METABOLIC PANEL
ALK PHOS: 142 U/L — AB (ref 39–117)
ALT: 36 U/L — AB (ref 0–35)
AST: 20 U/L (ref 0–37)
Albumin: 3.9 g/dL (ref 3.5–5.2)
Anion gap: 12 (ref 5–15)
BILIRUBIN TOTAL: 0.5 mg/dL (ref 0.3–1.2)
BUN: 10 mg/dL (ref 6–23)
CALCIUM: 9.4 mg/dL (ref 8.4–10.5)
CHLORIDE: 99 meq/L (ref 96–112)
CO2: 27 meq/L (ref 19–32)
Creatinine, Ser: 0.61 mg/dL (ref 0.50–1.10)
GLUCOSE: 325 mg/dL — AB (ref 70–99)
Potassium: 4.2 mEq/L (ref 3.7–5.3)
SODIUM: 138 meq/L (ref 137–147)
Total Protein: 7.9 g/dL (ref 6.0–8.3)

## 2014-08-28 LAB — CBC WITH DIFFERENTIAL/PLATELET
BASOS ABS: 0 10*3/uL (ref 0.0–0.1)
Basophils Relative: 0 % (ref 0–1)
EOS PCT: 3 % (ref 0–5)
Eosinophils Absolute: 0.2 10*3/uL (ref 0.0–0.7)
HEMATOCRIT: 40 % (ref 36.0–46.0)
Hemoglobin: 13.4 g/dL (ref 12.0–15.0)
LYMPHS ABS: 2.9 10*3/uL (ref 0.7–4.0)
LYMPHS PCT: 42 % (ref 12–46)
MCH: 27.6 pg (ref 26.0–34.0)
MCHC: 33.5 g/dL (ref 30.0–36.0)
MCV: 82.5 fL (ref 78.0–100.0)
Monocytes Absolute: 0.6 10*3/uL (ref 0.1–1.0)
Monocytes Relative: 8 % (ref 3–12)
NEUTROS ABS: 3.3 10*3/uL (ref 1.7–7.7)
Neutrophils Relative %: 47 % (ref 43–77)
PLATELETS: 359 10*3/uL (ref 150–400)
RBC: 4.85 MIL/uL (ref 3.87–5.11)
RDW: 13 % (ref 11.5–15.5)
WBC: 6.9 10*3/uL (ref 4.0–10.5)

## 2014-08-28 LAB — URINALYSIS, ROUTINE W REFLEX MICROSCOPIC
BILIRUBIN URINE: NEGATIVE
HGB URINE DIPSTICK: NEGATIVE
KETONES UR: NEGATIVE mg/dL
Leukocytes, UA: NEGATIVE
Nitrite: NEGATIVE
PROTEIN: NEGATIVE mg/dL
Specific Gravity, Urine: 1.025 (ref 1.005–1.030)
UROBILINOGEN UA: 0.2 mg/dL (ref 0.0–1.0)
pH: 5.5 (ref 5.0–8.0)

## 2014-08-28 LAB — CBG MONITORING, ED: GLUCOSE-CAPILLARY: 330 mg/dL — AB (ref 70–99)

## 2014-08-28 LAB — WET PREP, GENITAL
CLUE CELLS WET PREP: NONE SEEN
Trich, Wet Prep: NONE SEEN
Yeast Wet Prep HPF POC: NONE SEEN

## 2014-08-28 LAB — URINE MICROSCOPIC-ADD ON

## 2014-08-28 LAB — PREGNANCY, URINE: PREG TEST UR: NEGATIVE

## 2014-08-28 MED ORDER — HYDROCODONE-ACETAMINOPHEN 5-325 MG PO TABS
1.0000 | ORAL_TABLET | Freq: Once | ORAL | Status: AC
Start: 2014-08-28 — End: 2014-08-28
  Administered 2014-08-28: 1 via ORAL
  Filled 2014-08-28: qty 1

## 2014-08-28 MED ORDER — METFORMIN HCL 500 MG PO TABS: 500.0000 mg | ORAL_TABLET | Freq: Two times a day (BID) | ORAL | Status: DC

## 2014-08-28 MED ORDER — METFORMIN HCL 500 MG PO TABS
500.0000 mg | ORAL_TABLET | Freq: Once | ORAL | Status: AC
  Administered 2014-08-28: 500 mg via ORAL
  Filled 2014-08-28: qty 1

## 2014-08-28 MED ORDER — IBUPROFEN 800 MG PO TABS: 800.0000 mg | ORAL_TABLET | Freq: Three times a day (TID) | ORAL | Status: DC

## 2014-08-28 MED ORDER — HYDROCODONE-ACETAMINOPHEN 5-325 MG PO TABS: 2.0000 | ORAL_TABLET | ORAL | Status: DC | PRN

## 2014-08-28 MED ORDER — IBUPROFEN 800 MG PO TABS
800.0000 mg | ORAL_TABLET | Freq: Once | ORAL | Status: AC
  Administered 2014-08-28: 800 mg via ORAL
  Filled 2014-08-28: qty 1

## 2014-08-28 NOTE — ED Notes (Signed)
Pelvic exam set up at bedside. EDP aware

## 2014-08-28 NOTE — ED Provider Notes (Signed)
CSN: [id number]     Arrival date & time 08/28/14  1520 History   This chart was scribed for Tanna Furry, MD by [name], ED Scribe. This patient was seen in room APA09/APA09 and the patient's care was started at 6:07 PM.     Chief Complaint  Patient presents with  . Groin Pain     (Consider location/radiation/quality/duration/timing/severity/associated sxs/prior Treatment) The history is provided by the patient. No language interpreter was used.     HPI Comments: Mallory Mcdowell is a 28 y.o. female who presents to the Emergency Department complaining of moderate pain in the right lower abdomen for a week. Pt reports that pain has been constant all day today and yesterday. Pt also reports having similar pain three months prior. She was seen by doctor for similar pain and was told it was possibly an ovarian cyst. Pt notes that pain occurs when period is present and absent. Her LNMP was three weeks ago. She stated that she did not have one in September, but had a normal menstrual cycle in August. She denies vaginal discharge, vaginal bleeding, and nausea and vomiting. Pt reports occasional pain with intercourse that is associated with present symptoms.   Past Medical History  Diagnosis Date  . Hypertension    History reviewed. No pertinent past surgical history. History reviewed. No pertinent family history. History  Substance Use Topics  . Smoking status: Current Every Day Smoker -- 0.50 packs/day    Types: Cigarettes  . Smokeless tobacco: Not on file  . Alcohol Use: Yes   OB History   Grav Para Term Preterm Abortions TAB SAB Ect Mult Living                 Review of Systems  Gastrointestinal: Positive for abdominal pain. Negative for nausea and vomiting.  Genitourinary: Negative for vaginal bleeding and vaginal discharge.  All other systems reviewed and are negative.     Allergies  Review of patient's allergies indicates no known allergies.  Home Medications    Prior to Admission medications   Medication Sig Start Date End Date Taking? Authorizing Provider  HYDROcodone-acetaminophen (NORCO/VICODIN) 5-325 MG per tablet Take 2 tablets by mouth every 4 (four) hours as needed. 08/28/14   Tanna Furry, MD  ibuprofen (ADVIL,MOTRIN) 800 MG tablet Take 1 tablet (800 mg total) by mouth 3 (three) times daily. 08/28/14   Tanna Furry, MD  metFORMIN (GLUCOPHAGE) 500 MG tablet Take 1 tablet (500 mg total) by mouth 2 (two) times daily with a meal. 08/28/14   Tanna Furry, MD   BP 143/95  Pulse 96  Temp(Src) 98 F (36.7 C) (Oral)  Resp 16  Ht 5\' 4"  (1.626 m)  Wt 210 lb (95.255 kg)  BMI 36.03 kg/m2  SpO2 96%  LMP 08/14/2014 Physical Exam  Nursing note and vitals reviewed. Constitutional: She is oriented to person, place, and time. No distress.  Obese   HENT:  Head: Normocephalic.  Eyes: Conjunctivae are normal. Pupils are equal, round, and reactive to light. No scleral icterus.  Neck: Normal range of motion. Neck supple. No thyromegaly present.  Cardiovascular: Normal rate and regular rhythm.  Exam reveals no gallop and no friction rub.   No murmur heard. Pulmonary/Chest: Effort normal and breath sounds normal. No respiratory distress. She has no wheezes. She has no rales.  Abdominal: Soft. Bowel sounds are normal. She exhibits no distension. There is no tenderness. There is no rebound.  Minimal tenderness Right lower pelvis No RLQ tenderness  Musculoskeletal: Normal range of motion.  Neurological: She is alert and oriented to person, place, and time.  Skin: Skin is warm and dry. No rash noted.  Psychiatric: She has a normal mood and affect. Her behavior is normal.    ED Course  Procedures   DIAGNOSTIC STUDIES: Oxygen Saturation is 96% on room air , normal by my interpretation.    COORDINATION OF CARE: 6:12 PM Discussed treatment plan with pt at bedside and pt agreed to plan.   Labs Review Labs Reviewed  COMPREHENSIVE METABOLIC PANEL -  Abnormal; Notable for the following:    Glucose, Bld 325 (*)    ALT 36 (*)    Alkaline Phosphatase 142 (*)    All other components within normal limits  URINALYSIS, ROUTINE W REFLEX MICROSCOPIC - Abnormal; Notable for the following:    Glucose, UA >1000 (*)    All other components within normal limits  CBG MONITORING, ED - Abnormal; Notable for the following:    Glucose-Capillary 330 (*)    All other components within normal limits  WET PREP, GENITAL  GC/CHLAMYDIA PROBE AMP  CBC WITH DIFFERENTIAL  PREGNANCY, URINE  URINE MICROSCOPIC-ADD ON    Imaging Review No results found.   EKG Interpretation None      MDM   Final diagnoses:  Pelvic pain in female  Cyst of right ovary  Type 2 diabetes mellitus without complication   Patient given metformin. Referred to the [facility]. Also given GYN referral. Discussed with her the ovarian cyst usually resolve with next menses. GYN followup if hers  has not.   I personally performed the services described in this documentation, which was scribed in my presence. The recorded information has been reviewed and is accurate.    Tanna Furry, MD 08/28/14 [phone]

## 2014-08-28 NOTE — ED Notes (Signed)
Low pelvicpain, says her cbg was 297 at home and is not a known diabetic.  No nvd. Alert,  Dysuria,frequency of urination.  No d/c

## 2014-08-28 NOTE — Discharge Instructions (Signed)
Call the GYN listed above for follow up if your pain persists past your next period. Call the [facility] Diabetic clinic for appointment [phone].  Abdominal Pain Many things can cause belly (abdominal) pain. Most times, the belly pain is not dangerous. Many cases of belly pain can be watched and treated at home. HOME CARE   Do not take medicines that help you go poop (laxatives) unless told to by your doctor.  Only take medicine as told by your doctor.  Eat or drink as told by your doctor. Your doctor will tell you if you should be on a special diet. GET HELP IF:  You do not know what is causing your belly pain.  You have belly pain while you are sick to your stomach (nauseous) or have runny poop (diarrhea).  You have pain while you pee or poop.  Your belly pain wakes you up at night.  You have belly pain that gets worse or better when you eat.  You have belly pain that gets worse when you eat fatty foods.  You have a fever. GET HELP RIGHT AWAY IF:   The pain does not go away within 2 hours.  You keep throwing up (vomiting).  The pain changes and is only in the right or left part of the belly.  You have bloody or tarry looking poop. MAKE SURE YOU:   Understand these instructions.  Will watch your condition.  Will get help right away if you are not doing well or get worse. Document Released: 04/13/2008 Document Revised: 10/31/2013 Document Reviewed: 07/05/2013 [facility] Patient Information 2015 [place], [place]. This information is not intended to replace advice given to you by your health care provider. Make sure you discuss any questions you have with your health care provider.  High Blood Sugar High blood sugar (hyperglycemia) means that the level of sugar in your blood is higher than it should be. Signs of high blood sugar include:  Feeling thirsty.  Frequent peeing (urinating).  Feeling tired or sleepy.  Dry mouth.  Vision changes.  Feeling  weak.  Feeling hungry but losing weight.  Numbness and tingling in your hands or feet.  Headache. When you ignore these signs, your blood sugar may keep going up. These problems may get worse, and other problems may begin. HOME CARE  Check your blood sugars as told by your doctor. Write down the numbers with the date and time.  Take the right amount of insulin or diabetes pills at the right time. Write down the dose with date and time.  Refill your insulin or diabetes pills before running out.  Watch what you eat. Follow your meal plan.  Drink liquids without sugar, such as water. Check with your doctor if you have kidney or heart disease.  Follow your doctor's orders for exercise. Exercise at the same time of day.  Keep your doctor's appointments. GET HELP RIGHT AWAY IF:   You have trouble thinking or are confused.  You have fast breathing with fruity smelling breath.  You pass out (faint).  You have 2 to 3 days of high blood sugars and you do not know why.  You have chest pain.  You are feeling sick to your stomach (nauseous) or throwing up (vomiting).  You have sudden vision changes. MAKE SURE YOU:   Understand these instructions.  Will watch your condition.  Will get help right away if you are not doing well or get worse. Document Released: 08/23/2009 Document Revised: 01/18/2012 Document Reviewed:  08/23/2009 [employer] Patient Information 2015 [place], [place]. This information is not intended to replace advice given to you by your health care provider. Make sure you discuss any questions you have with your health care provider.  Ovarian Cyst An ovarian cyst is a fluid-filled sac that forms on an ovary. The ovaries are small organs that produce eggs in women. Various types of cysts can form on the ovaries. Most are not cancerous. Many do not cause problems, and they often go away on their own. Some may cause symptoms and require treatment. Common types of ovarian  cysts include:  Functional cysts--These cysts may occur every month during the menstrual cycle. This is normal. The cysts usually go away with the next menstrual cycle if the woman does not get pregnant. Usually, there are no symptoms with a functional cyst.  Endometrioma cysts--These cysts form from the tissue that lines the uterus. They are also called "chocolate cysts" because they become filled with blood that turns brown. This type of cyst can cause pain in the lower abdomen during intercourse and with your menstrual period.  Cystadenoma cysts--This type develops from the cells on the outside of the ovary. These cysts can get very big and cause lower abdomen pain and pain with intercourse. This type of cyst can twist on itself, cut off its blood supply, and cause severe pain. It can also easily rupture and cause a lot of pain.  Dermoid cysts--This type of cyst is sometimes found in both ovaries. These cysts may contain different kinds of body tissue, such as skin, teeth, hair, or cartilage. They usually do not cause symptoms unless they get very big.  Theca lutein cysts--These cysts occur when too much of a certain hormone (human chorionic gonadotropin) is produced and overstimulates the ovaries to produce an egg. This is most common after procedures used to assist with the conception of a baby (in vitro fertilization). CAUSES   Fertility drugs can cause a condition in which multiple large cysts are formed on the ovaries. This is called ovarian hyperstimulation syndrome.  A condition called polycystic ovary syndrome can cause hormonal imbalances that can lead to nonfunctional ovarian cysts. SIGNS AND SYMPTOMS  Many ovarian cysts do not cause symptoms. If symptoms are present, they may include:  Pelvic pain or pressure.  Pain in the lower abdomen.  Pain during sexual intercourse.  Increasing girth (swelling) of the abdomen.  Abnormal menstrual periods.  Increasing pain with menstrual  periods.  Stopping having menstrual periods without being pregnant. DIAGNOSIS  These cysts are commonly found during a routine or annual pelvic exam. Tests may be ordered to find out more about the cyst. These tests may include:  Ultrasound.  X-ray of the pelvis.  CT scan.  MRI.  Blood tests. TREATMENT  Many ovarian cysts go away on their own without treatment. Your health care provider may want to check your cyst regularly for 2-3 months to see if it changes. For women in menopause, it is particularly important to monitor a cyst closely because of the higher rate of ovarian cancer in menopausal women. When treatment is needed, it may include any of the following:  A procedure to drain the cyst (aspiration). This may be done using a long needle and ultrasound. It can also be done through a laparoscopic procedure. This involves using a thin, lighted tube with a tiny camera on the end (laparoscope) inserted through a small incision.  Surgery to remove the whole cyst. This may be done using laparoscopic  surgery or an open surgery involving a larger incision in the lower abdomen.  Hormone treatment or birth control pills. These methods are sometimes used to help dissolve a cyst. HOME CARE INSTRUCTIONS   Only take over-the-counter or prescription medicines as directed by your health care provider.  Follow up with your health care provider as directed.  Get regular pelvic exams and Pap tests. SEEK MEDICAL CARE IF:   Your periods are late, irregular, or painful, or they stop.  Your pelvic pain or abdominal pain does not go away.  Your abdomen becomes larger or swollen.  You have pressure on your bladder or trouble emptying your bladder completely.  You have pain during sexual intercourse.  You have feelings of fullness, pressure, or discomfort in your stomach.  You lose weight for no apparent reason.  You feel generally ill.  You become constipated.  You lose your  appetite.  You develop acne.  You have an increase in body and facial hair.  You are gaining weight, without changing your exercise and eating habits.  You think you are pregnant. SEEK IMMEDIATE MEDICAL CARE IF:   You have increasing abdominal pain.  You feel sick to your stomach (nauseous), and you throw up (vomit).  You develop a fever that comes on suddenly.  You have abdominal pain during a bowel movement.  Your menstrual periods become heavier than usual. MAKE SURE YOU:  Understand these instructions.  Will watch your condition.  Will get help right away if you are not doing well or get worse. Document Released: 10/26/2005 Document Revised: 10/31/2013 Document Reviewed: 07/03/2013 [facility] Patient Information 2015 [place], [place]. This information is not intended to replace advice given to you by your health care provider. Make sure you discuss any questions you have with your health care provider.

## 2014-08-28 NOTE — ED Notes (Signed)
Discharge instructions and prescriptions given and reviewed with patient.  Patient verbalized understanding to take medications as directed and to [facility].  Patient ambulatory; discharged home in good condition.

## 2014-08-30 ENCOUNTER — Encounter (HOSPITAL_BASED_OUTPATIENT_CLINIC_OR_DEPARTMENT_OTHER): Payer: Self-pay | Admitting: Emergency Medicine

## 2014-08-30 ENCOUNTER — Emergency Department (HOSPITAL_BASED_OUTPATIENT_CLINIC_OR_DEPARTMENT_OTHER)
Admission: EM | Admit: 2014-08-30 | Discharge: 2014-08-30 | Disposition: A | Discharge status: Home / Self Care | Payer: Medicaid Other | Attending: Emergency Medicine | Admitting: Emergency Medicine

## 2014-08-30 DIAGNOSIS — Z79899 Other long term (current) drug therapy: Secondary | ICD-10-CM | POA: Insufficient documentation

## 2014-08-30 DIAGNOSIS — I10 Essential (primary) hypertension: Secondary | ICD-10-CM | POA: Insufficient documentation

## 2014-08-30 DIAGNOSIS — R21 Rash and other nonspecific skin eruption: Secondary | ICD-10-CM | POA: Insufficient documentation

## 2014-08-30 DIAGNOSIS — Z720 Tobacco use: Secondary | ICD-10-CM | POA: Insufficient documentation

## 2014-08-30 DIAGNOSIS — E1165 Type 2 diabetes mellitus with hyperglycemia: Secondary | ICD-10-CM | POA: Insufficient documentation

## 2014-08-30 HISTORY — DX: Type 2 diabetes mellitus without complications: E11.9

## 2014-08-30 LAB — GC/CHLAMYDIA PROBE AMP
CT PROBE, AMP APTIMA: NEGATIVE
GC PROBE AMP APTIMA: NEGATIVE

## 2014-08-30 LAB — CBG MONITORING, ED: Glucose-Capillary: 317 mg/dL — ABNORMAL HIGH (ref 70–99)

## 2014-08-30 MED ORDER — PERMETHRIN 5 % EX CREA: TOPICAL_CREAM | CUTANEOUS | Status: DC

## 2014-08-30 MED ORDER — DIPHENHYDRAMINE HCL 25 MG PO TABS: 25.0000 mg | ORAL_TABLET | Freq: Three times a day (TID) | ORAL | Status: DC | PRN

## 2014-08-30 NOTE — ED Provider Notes (Signed)
CSN: [id number]     Arrival date & time 08/30/14  1157 History   First MD Initiated Contact with Patient 08/30/14 1247     Chief Complaint  Patient presents with  . Rash     (Consider location/radiation/quality/duration/timing/severity/associated sxs/prior Treatment) HPI  28 year old female with history of non-insulin-dependent diabetes, hypertension, who presents for evaluation of a rash. Patient reports 2-3 nights ago she was staying at a motel. She felt something bitten her throughout the night. The next day she noticed bumps and rash to both the thigh and to the low back. Rash is itchy, persistent, not improving. No specific treatment tried. No fever, severe headache, chest pain, trouble breathing, throat swelling, numbness or weakness. She has history of diabetes but did not take her metformin today. Denies any change in soap, detergent, new pets, new medication. No one else at home with similar rash. She had one similar episode of rash or stay in a hotel in the past.  Past Medical History  Diagnosis Date  . Hypertension   . Diabetes mellitus without complication    History reviewed. No pertinent past surgical history. No family history on file. History  Substance Use Topics  . Smoking status: Current Every Day Smoker -- 0.50 packs/day    Types: Cigarettes  . Smokeless tobacco: Not on file  . Alcohol Use: Yes   OB History   Grav Para Term Preterm Abortions TAB SAB Ect Mult Living                 Review of Systems  Constitutional: Negative for fever.  Skin: Positive for rash.  Neurological: Negative for numbness and headaches.      Allergies  Review of patient's allergies indicates no known allergies.  Home Medications   Prior to Admission medications   Medication Sig Start Date End Date Taking? Authorizing Provider  HYDROcodone-acetaminophen (NORCO/VICODIN) 5-325 MG per tablet Take 2 tablets by mouth every 4 (four) hours as needed. 08/28/14   Tanna Furry, MD   ibuprofen (ADVIL,MOTRIN) 800 MG tablet Take 1 tablet (800 mg total) by mouth 3 (three) times daily. 08/28/14   Tanna Furry, MD  metFORMIN (GLUCOPHAGE) 500 MG tablet Take 1 tablet (500 mg total) by mouth 2 (two) times daily with a meal. 08/28/14   Tanna Furry, MD   BP 145/86  Pulse 114  Temp(Src) 98.2 F (36.8 C) (Oral)  Resp 20  Ht 5\' 4"  (1.626 m)  Wt 210 lb (95.255 kg)  BMI 36.03 kg/m2  SpO2 99%  LMP 08/14/2014 Physical Exam  Nursing note and vitals reviewed. Constitutional: She is oriented to person, place, and time. She appears well-developed and well-nourished. No distress.  Moderately obese African American female appears to be in no acute distress  HENT:  Head: Atraumatic.  Eyes: Conjunctivae are normal.  Proptosis of both eyes  Neck: Neck supple.  Pulmonary/Chest: Effort normal and breath sounds normal. No respiratory distress.  Neurological: She is alert and oriented to person, place, and time.  Skin: Rash (Patient has multiple raised bump noted to bilateral thighs, and left low back with excoriation marks but no evidence of infection, no pus, testicle, or petechiae) noted.  Psychiatric: She has a normal mood and affect.    ED Course  Procedures (including critical care time)  1:01 PM Patient developed multiple bumps while staying at a motel. This is likely related to bed bugs. No evidence to suggest scabies although she does have some small rash on the dorsum of her right hand.  I recommend patient to wash all of her belongings in hot water, we'll provide permethrin as precautionary measure.  Recommend Benadryl as needed, and over-the-counter hydrocortisone cream for the itch.  Her blood sugar is 317 today. She did not take her medication today which I encourage close monitoring and taking medication as prescribed. She has evidence of proptosis, this may indicate some underlying thyroid disorder. Patient denies any history of thyroid disorder. I will provide a list of  resources of she can find a primary care Dr. for further management. Return precautions discussed. No red flags.  Labs Review Labs Reviewed  CBG MONITORING, ED - Abnormal; Notable for the following:    Glucose-Capillary 317 (*)    All other components within normal limits    Imaging Review No results found.   EKG Interpretation None      MDM   Final diagnoses:  Rash and nonspecific skin eruption  Hyperglycemia due to type 2 diabetes mellitus    BP 139/87  Pulse 94  Temp(Src) 98.2 F (36.8 C) (Oral)  Resp 20  Ht 5\' 4"  (1.626 m)  Wt 210 lb (95.255 kg)  BMI 36.03 kg/m2  SpO2 100%  LMP 08/14/2014     Domenic Moras, PA-C 08/30/14 1316

## 2014-08-30 NOTE — Discharge Instructions (Signed)
Your rash may be related to an allergic reaction to bed bugs. Please apply permethrin cream from neck to the rest of the body and leave it on for 8 hours, then washed off. May repeat one additional treatment in one week if no improvement. Wash all of your belongings in hot water to decrease risk of spreading. He may take Benadryl as needed for itch, you may use over-the-counter hydrocortisone cream 1%, for your rash. Please followup with primary care provider for further management of your health.    Bedbugs Bedbugs are tiny bugs that live in and around beds. During the day, they hide in mattresses and other places near beds. They come out at night and bite people lying in bed. They need blood to live and grow. Bedbugs can be found in beds anywhere. Usually, they are found in places where many people come and go (hotels, shelters, hospitals). It does not matter whether the place is dirty or clean. Getting bitten by bedbugs rarely causes a medical problem. The biggest problem can be getting rid of them. This often takes the work of a Financial risk analyst. CAUSES  Less use of pesticides. Bedbugs were common before the 1950s. Then, strong pesticides such as DDT nearly wiped them out. Today, these pesticides are not used because they harm the environment and can cause health problems.  More travel. Besides mattresses, bedbugs can also live in clothing and luggage. They can come along as people travel from place to place. Bedbugs are more common in certain parts of the world. When people travel to those areas, the bugs can come home with them.  Presence of birds and bats. Bedbugs often infest birds and bats. If you have these animals in or near your home, bedbugs may infest your house, too. SYMPTOMS It does not hurt to be bitten by a bedbug. You will probably not wake up when you are bitten. Bedbugs usually bite areas of the skin that are not covered. Symptoms may show when you wake up, or they may take a  day or more to show up. Symptoms may include:  Small red bumps on the skin. These might be lined up in a row or clustered in a group.  A darker red dot in the middle of red bumps.  Blisters on the skin. There may be swelling and very bad itching. These may be signs of an allergic reaction. This does not happen often. DIAGNOSIS Bedbug bites might look and feel like other types of insect bites. The bugs do not stay on the body like ticks or lice. They bite, drop off, and crawl away to hide. Your caregiver will probably:  Ask about your symptoms.  Ask about your recent activities and travel.  Check your skin for bedbug bites.  Ask you to check at home for signs of bedbugs. You should look for:  Spots or stains on the bed or nearby. This could be from bedbugs that were crushed or from their eggs or waste.  Bedbugs themselves. They are reddish-brown, oval, and flat. They do not fly. They are about the size of an apple seed.  Places to look for bedbugs include:  Beds. Check mattresses, headboards, box springs, and bed frames.  On drapes and curtains near the bed.  Under carpeting in the bedroom.  Behind electrical outlets.  Behind any wallpaper that is peeling.  Inside luggage. TREATMENT Most bedbug bites do not need treatment. They usually go away on their own in a few days. The  bites are not dangerous. However, treatment may be needed if you have scratched so much that your skin has become infected. You may also need treatment if you are allergic to bedbug bites. Treatment options include:  A drug that stops swelling and itching (corticosteroid). Usually, a cream is rubbed on the skin. If you have a bad rash, you may be given a corticosteroid pill.  Oral antihistamines. These are pills to help control itching.  Antibiotic medicines. An antibiotic may be prescribed for infected skin. HOME CARE INSTRUCTIONS   Take any medicine prescribed by your caregiver for your bites. Follow  the directions carefully.  Consider wearing pajamas with long sleeves and pant legs.  Your bedroom may need to be treated. A pest control expert should make sure the bedbugs are gone. You may need to throw away mattresses or luggage. Ask the pest control expert what you can do to keep the bedbugs from coming back. Common suggestions include:  Putting a plastic cover over your mattress.  Washing and drying your clothes and bedding in hot water and a hot dryer. The temperature should be hotter than 120 F (48.9 C). Bedbugs are killed by high temperatures.  Vacuuming carefully all around your bed. Vacuum in all cracks and crevices where the bugs might hide. Do this often.  Carefully checking all used furniture, bedding, or clothes that you bring into your house.  Eliminating bird nests and bat roosts.  If you get bedbug bites when traveling, check all your possessions carefully before bringing them into your house. If you find any bugs on clothes or in your luggage, consider throwing those items away. SEEK MEDICAL CARE IF:  You have red bug bites that keep coming back.  You have red bug bites that itch badly.  You have bug bites that cause a skin rash.  You have scratch marks that are red and sore. SEEK IMMEDIATE MEDICAL CARE IF: You have a fever. Document Released: 11/28/2010 Document Revised: 01/18/2012 Document Reviewed: 11/28/2010 [facility] Patient Information 2015 [place], [place]. This information is not intended to replace advice given to you by your health care provider. Make sure you discuss any questions you have with your health care provider.   Emergency Department Resource Guide 1) Find a Doctor and Pay Out of Pocket Although you won't have to find out who is covered by your insurance plan, it is a good idea to ask around and get recommendations. You will then need to call the office and see if the doctor you have chosen will accept you as a new patient and what types of  options they offer for patients who are self-pay. Some doctors offer discounts or will set up payment plans for their patients who do not have insurance, but you will need to ask so you aren't surprised when you get to your appointment.  2) Contact Your Local Health Department Not all health departments have doctors that can see patients for sick visits, but many do, so it is worth a call to see if yours does. If you don't know where your local health department is, you can check in your phone book. The CDC also has a tool to help you locate your state's health department, and many state websites also have listings of all of their local health departments.  3) Find a [facility] If your illness is not likely to be very severe or complicated, you may want to try a walk in clinic. These are popping up all over the  country in pharmacies, drugstores, and shopping centers. They're usually staffed by nurse practitioners or physician assistants that have been trained to treat common illnesses and complaints. They're usually fairly quick and inexpensive. However, if you have serious medical issues or chronic medical problems, these are probably not your best option.  No Primary Care Doctor: - Call Health Connect at  [phone] - they can help you locate a primary care doctor that  accepts your insurance, provides certain services, etc. - Physician Referral Service- [phone]  Chronic Pain Problems: Organization         Address  Phone   Notes  [facility]  [phone] Patients need to be referred by their primary care doctor.   Medication Assistance: Organization         Address  Phone   Notes  [facility] Medication [facility] [address] [place], [place] [address] [phone] --Must be a resident of [facility] -- Must have NO insurance coverage whatsoever (no Medicaid/ Medicare, etc.) -- The pt. MUST have a primary care doctor that directs  their care regularly and follows them in the community   MedAssist  [phone]   [employer]  [phone]    Agencies that provide inexpensive medical care: Organization         Address  Phone   Notes  [place]  [phone]   [facility]    [phone]   [facility] [place], [place] [address] [phone]   [place] [address], [place] [phone]   Planned Parenthood    [phone]   [facility]    [phone]   [place] and [place] [address], [place] Phone:  [phone], Fax:  [phone] Hours of Operation:  9 am - 6 pm, M-F.  Also accepts Medicaid/Medicare and self-pay.  [facility] for [place], [address], [place] Phone: [phone], Fax: [phone]. Hours of Operation:  8:30 am - 5:30 pm, M-F.  Also accepts Medicaid and self-pay.  [facility] [place] [address] [place] Phone: [phone]   [place], [place], [place] [phone], Ext. [phone] Mondays & Thursdays: 7-9 AM.  First 15 patients are seen on a first come, first serve basis.    [place] Providers:  Organization         Address  Phone   Notes  [facility] [address], [place] [phone] Also accepts self-pay patients.  [facility] [address], [place]  [phone]   [place], [address], [place] [phone]   [facility] [address] [place] [phone]   Lucianne Lei [address], [place]   [phone] Only accepts [place] Access [place] patients after they have their name applied to their card.   Self-Pay (no insurance) in [facility]:  Organization          Address  Phone   Notes  Sickle Cell Patients, [facility] Internal Medicine [place] [phone]   [facility] [place] [phone]   [facility]gent [place]  [place] [address]  [address], [place] [phone]   [facility]/Dr. Osei-Bonsu  [address], [place] or [address], [place] [phone] Phone number for both [place] and [place] locations is the same.  Urgent Medical and [facility] [address] [place] [phone]   [facility] [address] [place] or [address] [phone]   [facility] [address], [place] [phone], phone; [phone], fax Sees patients 1st and 3rd Saturday of every month.  Must not qualify for public or private insurance (i.e. Medicaid, Medicare, [place] Health Choice, Veterans' Benefits)  Household income should be no more than 200% of the poverty level The clinic cannot treat you if you are pregnant or think you are pregnant  Sexually transmitted diseases are not treated at the clinic.    Dental Care: Organization         Address  Phone  Notes  [facility] Department of [facility] [place] [phone] Accepts children up to age 27 who are enrolled in [place] or [place]; pregnant women with a Medicaid card; and children who have applied for Medicaid or [place] Health Choice, but were declined, whose parents can pay a reduced fee at time of service.  [facility] Department of [facility]  [address], [place] [phone] Accepts children up to age 76 who are enrolled in [place] or [place]; pregnant women with a Medicaid card; and children who have applied for Medicaid or [place] Health Choice, but were declined, whose parents can pay a reduced fee at time of  service.  [facility] Adult [facility] Access PRO[facility]  [place] [phone] Patients are seen by appointment only. Walk-ins are not accepted. [place] will see patients 56 years of age and older. Monday - Tuesday (8am-5pm) Most Wednesdays (8:30-5pm) $30 per visit, cash only  [facility] Adult Dental Access PROGRAM  [address], [facility] [phone] Patients are seen by appointment only. Walk-ins are not accepted. [place] will see patients 108 years of age and older. One Wednesday Evening (Monthly: Volunteer Based).  $30 per visit, cash only  [place]  [phone] for adults; Children under age 89, call Graduate Pediatric Dentistry at [phone]. Children aged 68-14, please call [phone] to request a pediatric application.  Dental services are provided in all areas of dental care including fillings, crowns and bridges, complete and partial dentures, implants, gum treatment, root canals, and extractions. Preventive care is also provided. Treatment is provided to both adults and children. Patients are selected via a lottery and there is often a waiting list.   [facility] [address] [place]  [phone] [email or web address]   [facility] [address] [place], [place] [phone], Ext. [phone] Second and Fourth Thursday of each month, opens at 6:30 AM; Clinic ends at 9 AM.  Patients are seen on a first-come first-served basis, and a limited number are seen during each clinic.   [facility]  [address] [place], [place] [phone]   Eligibility Requirements You must have lived in [place], [place], or [place] counties for at least the last three months.   You cannot be eligible for state or federal sponsored [employer], including [employer], [place], or [employer].   You generally  cannot be eligible for healthcare insurance through your employer.     How to apply: Eligibility screenings are held every Tuesday and Wednesday afternoon from 1:00 pm until 4:00 pm. You do not need an appointment for the interview!  [facility] [address], [place], [place]   [place]  [place] Department  [place]  [phone]    Behavioral Health Resources in the Community: Intensive Outpatient Programs Organization         Address  Phone  Notes  [place]. [address] [place], [place] [phone]   [facility] Outpatient [address], [place], [place]   ADS: Alcohol & Drug S[facility]cs [address], [place], [place]   [place] [address]  [place], [place] or [phone]   Substance Abuse Resources Organization         Address  Phone  Notes  Alcohol and Drug Services  [phone]   [place]  [phone]   [facility] [place]   Chinita Pester  [phone]   Residential & Outpatient Substance Abuse Program  [phone]   Psychological Services Organization         Address  Phone  Notes  [facility]  [place]  [phone]   [place] [address], [place] or [phone]    Mobile Crisis Teams Organization         Address  Phone  Notes  [facility], Mobile Crisis Care Unit  [phone]   [facility]  [address] [place], [place]   [place] [address], [place] [phone]    Self-Help/Support Groups Organization         Address  Phone             Notes  [place]. of [place] - variety of support groups  [place] Call for more information  Narcotics Anonymous (NA), [facility] [address]  Dr, [employer] [place]  2 meetings at this location   Special educational needs teacher         Address  Phone  Notes  [facility] [place],    [place]  [phone]   [facility]  [address] [place] [address], [place], [place]   [place], [place] [phone] Admissions: 8am-3pm M-F  Incentives Substance [place] [address]    [place], [place] [phone]   [facility] [address] [place], [place], [place]   [facility] [address]  [place], [place]   [facility] - Intensive Outpatient [address], [place], [place]   [facility] [address]  [place], [place] [phone] or [phone]   [facility] Services (RTS) [address] [place], [place] Accepts Medicaid  [facility] [place] [address],  [place] [phone] Substance Abuse/Addiction Treatment   [facility] Organization         Address  Phone  Notes  [facility]  [phone]   Domenic Schwab, PhD [address] [place], [place]   [phone] or [phone]   [place]  [place], [place] [phone]   [facility] [address] [place], [place] [phone] Insurance/Medicaid/sponsorship through [facility] and Families [address] [place], [place] [phone] [place], [place] [phone]    Dr. Adele Schilder  [phone]   [facility] Dept. 1) [address] [place] 2) [place] 3)  [place] [address], [place] [phone]  [phone]   [place] [phone] or [phone] (After Hours)

## 2014-08-30 NOTE — ED Provider Notes (Signed)
Medical screening examination/treatment/procedure(s) were performed by non-physician practitioner and as supervising physician I was immediately available for consultation/collaboration.     Veryl Speak, MD 08/30/14 [phone]

## 2014-08-30 NOTE — ED Notes (Signed)
States after staying in a hotel one night she woke with a rash. States her BP and blood sugar may be elevated due to stress.

## 2015-02-02 ENCOUNTER — Encounter (HOSPITAL_COMMUNITY): Payer: Self-pay | Admitting: Emergency Medicine

## 2015-02-02 ENCOUNTER — Emergency Department (HOSPITAL_COMMUNITY)
Admission: EM | Admit: 2015-02-02 | Discharge: 2015-02-02 | Disposition: A | Discharge status: Home / Self Care | Payer: Medicaid Other | Attending: Emergency Medicine | Admitting: Emergency Medicine

## 2015-02-02 DIAGNOSIS — E119 Type 2 diabetes mellitus without complications: Secondary | ICD-10-CM | POA: Insufficient documentation

## 2015-02-02 DIAGNOSIS — I10 Essential (primary) hypertension: Secondary | ICD-10-CM | POA: Insufficient documentation

## 2015-02-02 DIAGNOSIS — Z3202 Encounter for pregnancy test, result negative: Secondary | ICD-10-CM | POA: Insufficient documentation

## 2015-02-02 DIAGNOSIS — N760 Acute vaginitis: Secondary | ICD-10-CM | POA: Insufficient documentation

## 2015-02-02 DIAGNOSIS — B9689 Other specified bacterial agents as the cause of diseases classified elsewhere: Secondary | ICD-10-CM

## 2015-02-02 DIAGNOSIS — Z720 Tobacco use: Secondary | ICD-10-CM | POA: Insufficient documentation

## 2015-02-02 DIAGNOSIS — M545 Low back pain: Secondary | ICD-10-CM | POA: Insufficient documentation

## 2015-02-02 LAB — URINALYSIS, ROUTINE W REFLEX MICROSCOPIC
BILIRUBIN URINE: NEGATIVE
Glucose, UA: NEGATIVE mg/dL
Hgb urine dipstick: NEGATIVE
Ketones, ur: NEGATIVE mg/dL
Leukocytes, UA: NEGATIVE
Nitrite: NEGATIVE
PH: 5.5 (ref 5.0–8.0)
Protein, ur: NEGATIVE mg/dL
Specific Gravity, Urine: >1.03 — ABNORMAL HIGH (ref 1.005–1.030)
UROBILINOGEN UA: 0.2 mg/dL (ref 0.0–1.0)

## 2015-02-02 LAB — WET PREP, GENITAL
TRICH WET PREP: NONE SEEN
Yeast Wet Prep HPF POC: NONE SEEN

## 2015-02-02 LAB — PREGNANCY, URINE: Preg Test, Ur: NEGATIVE

## 2015-02-02 MED ORDER — CELECOXIB 100 MG PO CAPS: 100.0000 mg | ORAL_CAPSULE | Freq: Two times a day (BID) | ORAL | Status: DC

## 2015-02-02 MED ORDER — METRONIDAZOLE 500 MG PO TABS: 500.0000 mg | ORAL_TABLET | Freq: Two times a day (BID) | ORAL | Status: DC

## 2015-02-02 MED ORDER — BACLOFEN 10 MG PO TABS: 10.0000 mg | ORAL_TABLET | Freq: Three times a day (TID) | ORAL | Status: AC

## 2015-02-02 NOTE — ED Notes (Signed)
Pt reports vaginal discharge and low back pain.

## 2015-02-02 NOTE — ED Provider Notes (Signed)
CSN: [id number]     Arrival date & time 02/02/15  1353 History   First MD Initiated Contact with Patient 02/02/15 1428     Chief Complaint  Patient presents with  . Vaginal Discharge     (Consider location/radiation/quality/duration/timing/severity/associated sxs/prior Treatment) HPI Comments: Patient is a 29 year old female who presents to the emergency department with complaint of vaginal discharge, and back pain.  Patient states that for approximately a week or she is been having back pain, this has now evolved into some lower abdomen pain and some vaginal discharge. The patient denies any vaginal bleeding. She's not had any injury to her lower abdomen or to her lower back. The patient denies using any birth control pills or birth control of any kind. She states that she is sexually active. She denies any blood in the urine. His been no recent changes in dietary and medications.  The history is provided by the patient.    Past Medical History  Diagnosis Date  . Hypertension   . Diabetes mellitus without complication    History reviewed. No pertinent past surgical history. Family History  Problem Relation Age of Onset  . Asthma Mother   . Diabetes Other   . Heart failure Other   . Cancer Other    History  Substance Use Topics  . Smoking status: Current Every Day Smoker -- 0.50 packs/day    Types: Cigarettes  . Smokeless tobacco: Never Used  . Alcohol Use: Yes     Comment: occas   OB History    No data available     Review of Systems  Constitutional: Negative for activity change.       All ROS Neg except as noted in HPI  HENT: Negative for nosebleeds.   Eyes: Negative for photophobia and discharge.  Respiratory: Negative for cough, shortness of breath and wheezing.   Cardiovascular: Negative for chest pain and palpitations.  Gastrointestinal: Positive for abdominal pain. Negative for blood in stool.  Genitourinary: Positive for vaginal discharge. Negative for  dysuria, frequency, hematuria, vaginal bleeding, difficulty urinating and genital sores.  Musculoskeletal: Positive for back pain. Negative for arthralgias and neck pain.  Skin: Negative.   Neurological: Negative for dizziness, seizures and speech difficulty.  Psychiatric/Behavioral: Negative for hallucinations and confusion.      Allergies  Review of patient's allergies indicates no known allergies.  Home Medications   Prior to Admission medications   Medication Sig Start Date End Date Taking? Authorizing Provider  diphenhydrAMINE (BENADRYL) 25 MG tablet Take 1 tablet (25 mg total) by mouth every 8 (eight) hours as needed. Patient not taking: Reported on 02/02/2015 08/30/14   Domenic Moras, PA-C  HYDROcodone-acetaminophen (NORCO/VICODIN) 5-325 MG per tablet Take 2 tablets by mouth every 4 (four) hours as needed. Patient not taking: Reported on 02/02/2015 08/28/14   Tanna Furry, MD  ibuprofen (ADVIL,MOTRIN) 800 MG tablet Take 1 tablet (800 mg total) by mouth 3 (three) times daily. Patient not taking: Reported on 02/02/2015 08/28/14   Tanna Furry, MD  metFORMIN (GLUCOPHAGE) 500 MG tablet Take 1 tablet (500 mg total) by mouth 2 (two) times daily with a meal. Patient not taking: Reported on 02/02/2015 08/28/14   Tanna Furry, MD  permethrin (ELIMITE) 5 % cream Apply from neck to the rest of body, keep it on for 8-10 hrs then washed off completely.  Repeat in 1 week if no improvement. Patient not taking: Reported on 02/02/2015 08/30/14   Domenic Moras, PA-C   BP 116/91 mmHg  Pulse  99  Temp(Src) 98.3 F (36.8 C) (Oral)  Resp 14  Ht 5\' 4"  (1.626 m)  Wt 211 lb 2 oz (95.766 kg)  BMI 36.22 kg/m2  SpO2 98%  LMP 01/22/2015 Physical Exam  Constitutional: She is oriented to person, place, and time. She appears well-developed and well-nourished.  Non-toxic appearance.  HENT:  Head: Normocephalic.  Right Ear: Tympanic membrane and external ear normal.  Left Ear: Tympanic membrane and external ear normal.   Eyes: EOM and lids are normal. Pupils are equal, round, and reactive to light.  Neck: Normal range of motion. Neck supple. Carotid bruit is not present.  Cardiovascular: Normal rate, regular rhythm, normal heart sounds, intact distal pulses and normal pulses.   Pulmonary/Chest: Breath sounds normal. No respiratory distress.  Abdominal: Soft. Bowel sounds are normal. There is no tenderness. There is no guarding. Hernia confirmed negative in the right inguinal area and confirmed negative in the left inguinal area.  Genitourinary: There is no rash, tenderness, lesion or injury on the right labia. There is no rash, tenderness, lesion or injury on the left labia. There is tenderness in the vagina. No bleeding in the vagina. No foreign body around the vagina. No signs of injury around the vagina. Vaginal discharge found.  Chaperone present during exam.  Musculoskeletal: Normal range of motion.  Lymphadenopathy:       Head (right side): No submandibular adenopathy present.       Head (left side): No submandibular adenopathy present.    She has no cervical adenopathy.       Right: No inguinal adenopathy present.       Left: No inguinal adenopathy present.  Neurological: She is alert and oriented to person, place, and time. She has normal strength. No cranial nerve deficit or sensory deficit.  Skin: Skin is warm and dry.  Psychiatric: She has a normal mood and affect. Her speech is normal.  Nursing note and vitals reviewed.   ED Course  Procedures (including critical care time) Labs Review Labs Reviewed  URINALYSIS, ROUTINE W REFLEX MICROSCOPIC - Abnormal; Notable for the following:    Specific Gravity, Urine >1.030 (*)    All other components within normal limits  WET PREP, GENITAL  PREGNANCY, URINE  GC/CHLAMYDIA PROBE AMP ([place])    Imaging Review No results found.   EKG Interpretation None      MDM  Vital signs stable. Urine preg negative. UA neg for infection or kidney  stones. Wet prep is consistent with bacterial vaginosis. GC and chlamydia cultures have been sent to the lab.  Prescription for Flagyl given to the patient. Patient advised to follow-up at the health department in 7-10 days for recheck.    Final diagnoses:  None    **I have reviewed nursing notes, vital signs, and all appropriate lab and imaging results for this patient.Lily Kocher, PA-C 02/02/15 1538  Leonard Schwartz, MD 02/03/15 [phone]

## 2015-02-02 NOTE — Discharge Instructions (Signed)
Please take Flagyl and Celebrex with food. Please do not use any alcohol beverage while taking the Flagyl, as it will make you ill. Please see the physicians at the health department, or your Medicaid access physician Bacterial Vaginosis Bacterial vaginosis is a vaginal infection that occurs when the normal balance of bacteria in the vagina is disrupted. It results from an overgrowth of certain bacteria. This is the most common vaginal infection in women of childbearing age. Treatment is important to prevent complications, especially in pregnant women, as it can cause a premature delivery. CAUSES  Bacterial vaginosis is caused by an increase in harmful bacteria that are normally present in smaller amounts in the vagina. Several different kinds of bacteria can cause bacterial vaginosis. However, the reason that the condition develops is not fully understood. RISK FACTORS Certain activities or behaviors can put you at an increased risk of developing bacterial vaginosis, including:  Having a new sex partner or multiple sex partners.  Douching.  Using an intrauterine device (IUD) for contraception. Women do not get bacterial vaginosis from toilet seats, bedding, swimming pools, or contact with objects around them. SIGNS AND SYMPTOMS  Some women with bacterial vaginosis have no signs or symptoms. Common symptoms include:  Grey vaginal discharge.  A fishlike odor with discharge, especially after sexual intercourse.  Itching or burning of the vagina and vulva.  Burning or pain with urination. DIAGNOSIS  Your health care provider will take a medical history and examine the vagina for signs of bacterial vaginosis. A sample of vaginal fluid may be taken. Your health care provider will look at this sample under a microscope to check for bacteria and abnormal cells. A vaginal pH test may also be done.  TREATMENT  Bacterial vaginosis may be treated with antibiotic medicines. These may be given in the  form of a pill or a vaginal cream. A second round of antibiotics may be prescribed if the condition comes back after treatment.  HOME CARE INSTRUCTIONS   Only take over-the-counter or prescription medicines as directed by your health care provider.  If antibiotic medicine was prescribed, take it as directed. Make sure you finish it even if you start to feel better.  Do not have sex until treatment is completed.  Tell all sexual partners that you have a vaginal infection. They should see their health care provider and be treated if they have problems, such as a mild rash or itching.  Practice safe sex by using condoms and only having one sex partner. SEEK MEDICAL CARE IF:   Your symptoms are not improving after 3 days of treatment.  You have increased discharge or pain.  You have a fever. MAKE SURE YOU:   Understand these instructions.  Will watch your condition.  Will get help right away if you are not doing well or get worse. FOR MORE INFORMATION  Centers for Disease Control and Prevention, Division of STD Prevention: [employer] American Sexual Health Association (ASHA): www.ashastd.org  Document Released: 10/26/2005 Document Revised: 08/16/2013 Document Reviewed: 06/07/2013 [facility] Patient Information 2015 [place], [place]. This information is not intended to replace advice given to you by your health care provider. Make sure you discuss any questions you have with your health care provider.  for recheck in 7-10 days.`

## 2015-02-04 LAB — GC/CHLAMYDIA PROBE AMP (~~LOC~~) NOT AT ARMC
CHLAMYDIA, DNA PROBE: NEGATIVE
NEISSERIA GONORRHEA: NEGATIVE

## 2015-02-21 ENCOUNTER — Emergency Department (HOSPITAL_COMMUNITY)
Admission: EM | Admit: 2015-02-21 | Discharge: 2015-02-21 | Disposition: A | Discharge status: Home / Self Care | Payer: Medicaid Other | Attending: Emergency Medicine | Admitting: Emergency Medicine

## 2015-02-21 ENCOUNTER — Encounter (HOSPITAL_COMMUNITY): Payer: Self-pay | Admitting: *Deleted

## 2015-02-21 DIAGNOSIS — N921 Excessive and frequent menstruation with irregular cycle: Secondary | ICD-10-CM | POA: Insufficient documentation

## 2015-02-21 DIAGNOSIS — I10 Essential (primary) hypertension: Secondary | ICD-10-CM | POA: Insufficient documentation

## 2015-02-21 DIAGNOSIS — Z79899 Other long term (current) drug therapy: Secondary | ICD-10-CM | POA: Insufficient documentation

## 2015-02-21 DIAGNOSIS — Z720 Tobacco use: Secondary | ICD-10-CM | POA: Insufficient documentation

## 2015-02-21 DIAGNOSIS — E119 Type 2 diabetes mellitus without complications: Secondary | ICD-10-CM | POA: Insufficient documentation

## 2015-02-21 LAB — POC URINE PREG, ED: PREG TEST UR: NEGATIVE

## 2015-02-21 NOTE — ED Notes (Signed)
Patient had pelvic exam completed by PA. Waiting for pregnancy.

## 2015-02-21 NOTE — Discharge Instructions (Signed)
No tampon seen in vaginal canal. If heavy vaginal bleeding persists follow up with your GYN doctor. Your pregnancy test is negative   Abnormal Uterine Bleeding Abnormal uterine bleeding can affect women at various stages in life, including teenagers, women in their reproductive years, pregnant women, and women who have reached menopause. Several kinds of uterine bleeding are considered abnormal, including:  Bleeding or spotting between periods.   Bleeding after sexual intercourse.   Bleeding that is heavier or more than normal.   Periods that last longer than usual.  Bleeding after menopause.  Many cases of abnormal uterine bleeding are minor and simple to treat, while others are more serious. Any type of abnormal bleeding should be evaluated by your health care provider. Treatment will depend on the cause of the bleeding. HOME CARE INSTRUCTIONS Monitor your condition for any changes. The following actions may help to alleviate any discomfort you are experiencing:  Avoid the use of tampons and douches as directed by your health care provider.  Change your pads frequently. You should get regular pelvic exams and Pap tests. Keep all follow-up appointments for diagnostic tests as directed by your health care provider.  SEEK MEDICAL CARE IF:   Your bleeding lasts more than 1 week.   You feel dizzy at times.  SEEK IMMEDIATE MEDICAL CARE IF:   You pass out.   You are changing pads every 15 to 30 minutes.   You have abdominal pain.  You have a fever.   You become sweaty or weak.   You are passing large blood clots from the vagina.   You start to feel nauseous and vomit. MAKE SURE YOU:   Understand these instructions.  Will watch your condition.  Will get help right away if you are not doing well or get worse. Document Released: 10/26/2005 Document Revised: 10/31/2013 Document Reviewed: 05/25/2013 [facility] Patient Information 2015 [place], [place]. This  information is not intended to replace advice given to you by your health care provider. Make sure you discuss any questions you have with your health care provider.

## 2015-02-21 NOTE — ED Provider Notes (Signed)
CSN: [id number]     Arrival date & time 02/21/15  1611 History  This chart was scribed for Jeannett Senior, PA-C with Fredia Sorrow, MD by Edison Simon, ED Scribe. This patient was seen in room TR02C/TR02C and the patient's care was started at 4:55 PM.    Chief Complaint  Patient presents with  . Vaginal Discharge   HPI  HPI Comments: Mallory Mcdowell is a 29 y.o. female who presents to the Emergency Department complaining of possible foreign body in her vagina. She states she put a tampon in 2 days ago when her period began. She has not removed it, but states it is possible it fell out while she was using the bathroom. She then had continued bleeding today so she tried to put another in, but felt like it hit something; she then noticed "black stuff" coming out of her vagina. She denies other discharge. She reports abdominal cramping that is somewhat worse than typical periods, but denies other symptoms. She states this is her second period this month, but states she sometimes has irregular periods. She does not think she is pregnant but has not checked.   Past Medical History  Diagnosis Date  . Hypertension   . Diabetes mellitus without complication    History reviewed. No pertinent past surgical history. Family History  Problem Relation Age of Onset  . Asthma Mother   . Diabetes Other   . Heart failure Other   . Cancer Other    History  Substance Use Topics  . Smoking status: Current Every Day Smoker -- 0.50 packs/day    Types: Cigarettes  . Smokeless tobacco: Never Used  . Alcohol Use: Yes     Comment: occas   OB History    No data available     Review of Systems  Gastrointestinal: Positive for abdominal pain.  Genitourinary: Positive for vaginal bleeding and menstrual problem. Negative for dysuria, vaginal discharge and vaginal pain.  All other systems reviewed and are negative.     Allergies  Review of patient's allergies indicates no known allergies.  Home  Medications   Prior to Admission medications   Medication Sig Start Date End Date Taking? Authorizing Provider  baclofen (LIORESAL) 10 MG tablet Take 1 tablet (10 mg total) by mouth 3 (three) times daily. 02/02/15 03/04/15  Lily Kocher, PA-C  celecoxib (CELEBREX) 100 MG capsule Take 1 capsule (100 mg total) by mouth 2 (two) times daily. 02/02/15   Lily Kocher, PA-C  diphenhydrAMINE (BENADRYL) 25 MG tablet Take 1 tablet (25 mg total) by mouth every 8 (eight) hours as needed. Patient not taking: Reported on 02/02/2015 08/30/14   Domenic Moras, PA-C  HYDROcodone-acetaminophen (NORCO/VICODIN) 5-325 MG per tablet Take 2 tablets by mouth every 4 (four) hours as needed. Patient not taking: Reported on 02/02/2015 08/28/14   Tanna Furry, MD  ibuprofen (ADVIL,MOTRIN) 800 MG tablet Take 1 tablet (800 mg total) by mouth 3 (three) times daily. Patient not taking: Reported on 02/02/2015 08/28/14   Tanna Furry, MD  metFORMIN (GLUCOPHAGE) 500 MG tablet Take 1 tablet (500 mg total) by mouth 2 (two) times daily with a meal. Patient not taking: Reported on 02/02/2015 08/28/14   Tanna Furry, MD  metroNIDAZOLE (FLAGYL) 500 MG tablet Take 1 tablet (500 mg total) by mouth 2 (two) times daily. 02/02/15   Lily Kocher, PA-C  permethrin (ELIMITE) 5 % cream Apply from neck to the rest of body, keep it on for 8-10 hrs then washed off completely.  Repeat  in 1 week if no improvement. Patient not taking: Reported on 02/02/2015 08/30/14   Domenic Moras, PA-C   BP 139/94 mmHg  Pulse 106  Temp(Src) 97.7 F (36.5 C) (Oral)  Resp 16  SpO2 96%  LMP 01/22/2015 Physical Exam  Constitutional: She appears well-developed and well-nourished.  HENT:  Head: Normocephalic and atraumatic.  Eyes: Conjunctivae are normal. Right eye exhibits no discharge. Left eye exhibits no discharge.  Pulmonary/Chest: Effort normal. No respiratory distress.  Genitourinary:  Normal external genitalia. Normal vaginal canal. Small blood edge of the canal. No  foreign body seen or palpated on bimanual exam. Cervix is normal, closed. No CMT. No uterine or adnexal tenderness. No masses palpated.    Neurological: She is alert. Coordination normal.  Skin: Skin is warm and dry. No rash noted. She is not diaphoretic. No erythema.  Psychiatric: She has a normal mood and affect.  Nursing note and vitals reviewed.   ED Course  Procedures (including critical care time)  DIAGNOSTIC STUDIES: Oxygen Saturation is 96% on room air, normal by my interpretation.    COORDINATION OF CARE: 5:00 PM Discussed treatment plan with patient at beside, including pelvic exam. The patient agrees with the plan and has no further questions at this time.   Labs Review Labs Reviewed - No data to display  Imaging Review No results found.   EKG Interpretation None      MDM   Final diagnoses:  Menometrorrhagia   patient with irregular uterine bleeding, also concerned that she may have a tampon that she does not remember pulling out. Exam is unremarkable other than small blood and vaginal canal. No foreign body seen or palpated. Patient reassured. Pregnancy test is negative. Home with follow-up as needed.  Filed Vitals:   02/21/15 1615 02/21/15 1751  BP: 139/94 126/85  Pulse: 106 94  Temp: 97.7 F (36.5 C) 98 F (36.7 C)  TempSrc: Oral Oral  Resp: 16 20  SpO2: 96% 99%  .  I personally performed the services described in this documentation, which was scribed in my presence. The recorded information has been reviewed and is accurate.\   Jeannett Senior, PA-C 02/21/15 [place], MD 02/22/15 [phone]

## 2015-02-21 NOTE — ED Notes (Signed)
Pt unsure if she removed the tampon she put in 2 days ago. No fevers. No abd pain. Tried to put a tampon in today and states she felt pain and 'black stuff came out'.

## 2015-03-25 ENCOUNTER — Encounter (HOSPITAL_COMMUNITY): Payer: Self-pay | Admitting: *Deleted

## 2015-03-25 ENCOUNTER — Emergency Department (HOSPITAL_COMMUNITY)
Admission: EM | Admit: 2015-03-25 | Discharge: 2015-03-26 | Disposition: A | Discharge status: Home / Self Care | Payer: Medicaid Other | Attending: Emergency Medicine | Admitting: Emergency Medicine

## 2015-03-25 DIAGNOSIS — I10 Essential (primary) hypertension: Secondary | ICD-10-CM | POA: Insufficient documentation

## 2015-03-25 DIAGNOSIS — E119 Type 2 diabetes mellitus without complications: Secondary | ICD-10-CM | POA: Insufficient documentation

## 2015-03-25 DIAGNOSIS — R000 Tachycardia, unspecified: Secondary | ICD-10-CM | POA: Insufficient documentation

## 2015-03-25 DIAGNOSIS — Z720 Tobacco use: Secondary | ICD-10-CM | POA: Insufficient documentation

## 2015-03-25 DIAGNOSIS — Z79899 Other long term (current) drug therapy: Secondary | ICD-10-CM | POA: Insufficient documentation

## 2015-03-25 DIAGNOSIS — R0789 Other chest pain: Secondary | ICD-10-CM

## 2015-03-25 NOTE — ED Notes (Signed)
Pt c/o sharp shooting pains under left breast radiating around to back for the last couple of weeks

## 2015-03-26 ENCOUNTER — Emergency Department (HOSPITAL_COMMUNITY): Payer: Medicaid Other

## 2015-03-26 LAB — COMPREHENSIVE METABOLIC PANEL
ALK PHOS: 107 U/L (ref 38–126)
ALT: 21 U/L (ref 14–54)
AST: 16 U/L (ref 15–41)
Albumin: 3.6 g/dL (ref 3.5–5.0)
Anion gap: 9 (ref 5–15)
BUN: 12 mg/dL (ref 6–20)
CALCIUM: 8.9 mg/dL (ref 8.9–10.3)
CO2: 25 mmol/L (ref 22–32)
Chloride: 104 mmol/L (ref 101–111)
Creatinine, Ser: 0.64 mg/dL (ref 0.44–1.00)
GFR calc Af Amer: >60 mL/min (ref >60)
Glucose, Bld: 191 mg/dL — ABNORMAL HIGH (ref 65–99)
Potassium: 3.4 mmol/L — ABNORMAL LOW (ref 3.5–5.1)
SODIUM: 138 mmol/L (ref 135–145)
Total Bilirubin: 0.4 mg/dL (ref 0.3–1.2)
Total Protein: 7.3 g/dL (ref 6.5–8.1)

## 2015-03-26 LAB — D-DIMER, QUANTITATIVE: D-Dimer, Quant: <0.27 ug/mL-FEU (ref 0.00–0.48)

## 2015-03-26 LAB — CBC WITH DIFFERENTIAL/PLATELET
Basophils Absolute: 0 10*3/uL (ref 0.0–0.1)
Basophils Relative: 0 % (ref 0–1)
EOS ABS: 0.2 10*3/uL (ref 0.0–0.7)
EOS PCT: 3 % (ref 0–5)
HEMATOCRIT: 38.2 % (ref 36.0–46.0)
HEMOGLOBIN: 12.4 g/dL (ref 12.0–15.0)
LYMPHS PCT: 38 % (ref 12–46)
Lymphs Abs: 3.3 10*3/uL (ref 0.7–4.0)
MCH: 27.3 pg (ref 26.0–34.0)
MCHC: 32.5 g/dL (ref 30.0–36.0)
MCV: 84.1 fL (ref 78.0–100.0)
Monocytes Absolute: 0.5 10*3/uL (ref 0.1–1.0)
Monocytes Relative: 6 % (ref 3–12)
Neutro Abs: 4.5 10*3/uL (ref 1.7–7.7)
Neutrophils Relative %: 53 % (ref 43–77)
Platelets: 428 10*3/uL — ABNORMAL HIGH (ref 150–400)
RBC: 4.54 MIL/uL (ref 3.87–5.11)
RDW: 13.3 % (ref 11.5–15.5)
WBC: 8.5 10*3/uL (ref 4.0–10.5)

## 2015-03-26 LAB — MAGNESIUM: Magnesium: 2 mg/dL (ref 1.7–2.4)

## 2015-03-26 LAB — TROPONIN I: Troponin I: <0.03 ng/mL (ref <0.031)

## 2015-03-26 MED ORDER — SODIUM CHLORIDE 0.9 % IV SOLN
INTRAVENOUS | Status: DC
  Administered 2015-03-26: 02:00:00 via INTRAVENOUS

## 2015-03-26 MED ORDER — CYCLOBENZAPRINE HCL 5 MG PO TABS: 5.0000 mg | ORAL_TABLET | Freq: Three times a day (TID) | ORAL | Status: DC | PRN

## 2015-03-26 MED ORDER — NAPROXEN 250 MG PO TABS: 250.0000 mg | ORAL_TABLET | Freq: Two times a day (BID) | ORAL | Status: DC

## 2015-03-26 MED ORDER — KETOROLAC TROMETHAMINE 30 MG/ML IJ SOLN
30.0000 mg | Freq: Once | INTRAMUSCULAR | Status: AC
  Administered 2015-03-26: 30 mg via INTRAVENOUS
  Filled 2015-03-26: qty 1

## 2015-03-26 NOTE — ED Provider Notes (Signed)
CSN: [id number]     Arrival date & time 03/25/15  1836 History   First MD Initiated Contact with Patient 03/26/15 0102     Chief Complaint  Patient presents with  . Chest Pain     (Consider location/radiation/quality/duration/timing/severity/associated sxs/prior Treatment) HPI  Patient states she's been having some feeling that her heart is beating fast and having some chest discomfort off and on for the past few months. However the past few weeks it is gotten worse and now is constant. She states she has pain underneath her left breast. She states it starts as a sharp jab in and she gets a constant aching pain. She states today for the first time she took an aspirin and it went away however it returned. She had some shortness of breath yesterday for 2 hours with diaphoresis. She reports a dry cough for the past 3 weeks with fever of 100 a few days ago. She denies sore throat, rhinorrhea, nausea, vomiting, pain or swelling in her legs.  Family history she states her mother is deceased had an enlarged heart  Patient states in November she attempted to donate plasma and was told her blood sugar was high. She states she came to the ED and was started on metformin. She states about a month ago she went back to the pharmacy and they refilled it. She has not sought a primary care doctor. She states when she checks her blood sugars at home they can be 192-90. Patient states she does not believe she has diabetes.   PCP None  Past Medical History  Diagnosis Date  . Hypertension   . Diabetes mellitus without complication    History reviewed. No pertinent past surgical history. Family History  Problem Relation Age of Onset  . Asthma Mother   . Diabetes Other   . Heart failure Other   . Cancer Other    History  Substance Use Topics  . Smoking status: Current Every Day Smoker -- 0.50 packs/day    Types: Cigarettes  . Smokeless tobacco: Never Used  . Alcohol Use: Yes     Comment: occas    Unemployed Smokes 1 ppweek  OB History    No data available     Review of Systems  All other systems reviewed and are negative.     Allergies  Review of patient's allergies indicates no known allergies.  Home Medications   Prior to Admission medications   Medication Sig Start Date End Date Taking? Authorizing Provider  celecoxib (CELEBREX) 100 MG capsule Take 1 capsule (100 mg total) by mouth 2 (two) times daily. 02/02/15   Lily Kocher, PA-C  cyclobenzaprine (FLEXERIL) 5 MG tablet Take 1 tablet (5 mg total) by mouth 3 (three) times daily as needed (muscle soreness). 03/26/15   Rolland Porter, MD  diphenhydrAMINE (BENADRYL) 25 MG tablet Take 1 tablet (25 mg total) by mouth every 8 (eight) hours as needed. Patient not taking: Reported on 02/02/2015 08/30/14   Domenic Moras, PA-C  HYDROcodone-acetaminophen (NORCO/VICODIN) 5-325 MG per tablet Take 2 tablets by mouth every 4 (four) hours as needed. Patient not taking: Reported on 02/02/2015 08/28/14   Tanna Furry, MD  ibuprofen (ADVIL,MOTRIN) 800 MG tablet Take 1 tablet (800 mg total) by mouth 3 (three) times daily. Patient not taking: Reported on 02/02/2015 08/28/14   Tanna Furry, MD  metFORMIN (GLUCOPHAGE) 500 MG tablet Take 1 tablet (500 mg total) by mouth 2 (two) times daily with a meal. Patient not taking: Reported on 02/02/2015 08/28/14  Tanna Furry, MD  metroNIDAZOLE (FLAGYL) 500 MG tablet Take 1 tablet (500 mg total) by mouth 2 (two) times daily. 02/02/15   Lily Kocher, PA-C  naproxen (NAPROSYN) 250 MG tablet Take 1 tablet (250 mg total) by mouth 2 (two) times daily. 03/26/15   Rolland Porter, MD  permethrin (ELIMITE) 5 % cream Apply from neck to the rest of body, keep it on for 8-10 hrs then washed off completely.  Repeat in 1 week if no improvement. Patient not taking: Reported on 02/02/2015 08/30/14   Domenic Moras, PA-C    Pt only taking metformin    BP 146/90 mmHg  Pulse 103  Temp(Src) 98.6 F (37 C) (Oral)  Resp 18  Ht 5\' 4"  (1.626  m)  Wt 210 lb (95.255 kg)  BMI 36.03 kg/m2  SpO2 98%  LMP 03/20/2015  Vital signs normal except for tachycardia  Physical Exam  Constitutional: She is oriented to person, place, and time. She appears well-developed and well-nourished.  Non-toxic appearance. She does not appear ill. No distress.  HENT:  Head: Normocephalic and atraumatic.  Right Ear: External ear normal.  Left Ear: External ear normal.  Nose: Nose normal. No mucosal edema or rhinorrhea.  Mouth/Throat: Oropharynx is clear and moist and mucous membranes are normal. No dental abscesses or uvula swelling.  Eyes: Conjunctivae and EOM are normal. Pupils are equal, round, and reactive to light.  Neck: Normal range of motion and full passive range of motion without pain. Neck supple.  Cardiovascular: Regular rhythm and normal heart sounds.  Tachycardia present.  Exam reveals no gallop and no friction rub.   No murmur heard. Pulmonary/Chest: Effort normal and breath sounds normal. No respiratory distress. She has no wheezes. She has no rhonchi. She has no rales. She exhibits tenderness. She exhibits no crepitus.    Patient indicates she has pain underneath her left breast with some mild tenderness to palpation  Abdominal: Soft. Normal appearance and bowel sounds are normal. She exhibits no distension. There is no tenderness. There is no rebound and no guarding.  Musculoskeletal: Normal range of motion. She exhibits no edema or tenderness.  Moves all extremities well.   Neurological: She is alert and oriented to person, place, and time. She has normal strength. No cranial nerve deficit.  Skin: Skin is warm, dry and intact. No rash noted. No erythema. No pallor.  Psychiatric: She has a normal mood and affect. Her speech is normal and behavior is normal. Her mood appears not anxious.  Nursing note and vitals reviewed.   ED Course  Procedures (including critical care time)   Medications  0.9 %  sodium chloride infusion (  Intravenous New Bag/Given 03/26/15 0210)  ketorolac (TORADOL) 30 MG/ML injection 30 mg (30 mg Intravenous Given 03/26/15 0210)    04:20 Recheck pt given her test results. States her headache is gone, still has some chest discomfort.  Pt reassured and will give referrals to get an PCP to manage her diabetes.     Labs Review Results for orders placed or performed during the hospital encounter of 03/25/15  Troponin I  Result Value Ref Range   Troponin I <0.03 <0.031 ng/mL  D-dimer, quantitative  Result Value Ref Range   D-Dimer, Quant <0.27 0.00 - 0.48 ug/mL-FEU  Comprehensive metabolic panel  Result Value Ref Range   Sodium 138 135 - 145 mmol/L   Potassium 3.4 (L) 3.5 - 5.1 mmol/L   Chloride 104 101 - 111 mmol/L   CO2 25 22 -  32 mmol/L   Glucose, Bld 191 (H) 65 - 99 mg/dL   BUN 12 6 - 20 mg/dL   Creatinine, Ser 0.64 0.44 - 1.00 mg/dL   Calcium 8.9 8.9 - 10.3 mg/dL   Total Protein 7.3 6.5 - 8.1 g/dL   Albumin 3.6 3.5 - 5.0 g/dL   AST 16 15 - 41 U/L   ALT 21 14 - 54 U/L   Alkaline Phosphatase 107 38 - 126 U/L   Total Bilirubin 0.4 0.3 - 1.2 mg/dL   GFR calc non Af Amer >60 >60 mL/min   GFR calc Af Amer >60 >60 mL/min   Anion gap 9 5 - 15  CBC with Differential  Result Value Ref Range   WBC 8.5 4.0 - 10.5 K/uL   RBC 4.54 3.87 - 5.11 MIL/uL   Hemoglobin 12.4 12.0 - 15.0 g/dL   HCT 38.2 36.0 - 46.0 %   MCV 84.1 78.0 - 100.0 fL   MCH 27.3 26.0 - 34.0 pg   MCHC 32.5 30.0 - 36.0 g/dL   RDW 13.3 11.5 - 15.5 %   Platelets 428 (H) 150 - 400 K/uL   Neutrophils Relative % 53 43 - 77 %   Neutro Abs 4.5 1.7 - 7.7 K/uL   Lymphocytes Relative 38 12 - 46 %   Lymphs Abs 3.3 0.7 - 4.0 K/uL   Monocytes Relative 6 3 - 12 %   Monocytes Absolute 0.5 0.1 - 1.0 K/uL   Eosinophils Relative 3 0 - 5 %   Eosinophils Absolute 0.2 0.0 - 0.7 K/uL   Basophils Relative 0 0 - 1 %   Basophils Absolute 0.0 0.0 - 0.1 K/uL  Magnesium  Result Value Ref Range   Magnesium 2.0 1.7 - 2.4 mg/dL    Laboratory interpretation all normal except for hyperglycemia     Imaging Review Dg Chest 2 View  03/26/2015   CLINICAL DATA:  Chronic left-sided chest pain.  Initial encounter.  EXAM: CHEST  2 VIEW  COMPARISON:  Chest radiograph from 01/09/2014  FINDINGS: The lungs are well-aerated and clear. There is no evidence of focal opacification, pleural effusion or pneumothorax.  The heart is normal in size; the mediastinal contour is within normal limits. No acute osseous abnormalities are seen.  IMPRESSION: No acute cardiopulmonary process seen.   Electronically Signed   By: Garald Balding M.D.   On: 03/26/2015 02:46     EKG Interpretation   Date/Time:  Monday Mar 25 2015 19:15:34 EDT Ventricular Rate:  106 PR Interval:  138 QRS Duration: 72 QT Interval:  340 QTC Calculation: 451 R Axis:   17 Text Interpretation:  Sinus tachycardia Possible Left atrial enlargement  Left ventricular hypertrophy Since last tracing rate faster (08 Jan 2014)  Confirmed by [facility]  MD-I, Darroll Bredeson ([id number]) on 03/26/2015 1:45:32 AM      MDM   Final diagnoses:  Type 2 diabetes mellitus without complication  Atypical chest pain    New Prescriptions   CYCLOBENZAPRINE (FLEXERIL) 5 MG TABLET    Take 1 tablet (5 mg total) by mouth 3 (three) times daily as needed (muscle soreness).   NAPROXEN (NAPROSYN) 250 MG TABLET    Take 1 tablet (250 mg total) by mouth 2 (two) times daily.    Plan discharge  Rolland Porter, MD, Barbette Or, MD 03/26/15 [phone]

## 2015-03-26 NOTE — ED Notes (Signed)
Pt states she never left waiting room, but pt never came when called

## 2015-03-26 NOTE — Discharge Instructions (Signed)
Look at the diabetic diet information and try to start following it. You need to stay on your metformin and get a primary care doctor to oversee your diabetes. I gave you 2 numbers to call to get a primary care doctor. Take the medication for your chest pain as needed. Return to the ED if you get short of breath, have uncontrolled vomiting or diarrhea, fever or seem worse.    Chest Pain (Nonspecific) It is often hard to give a diagnosis for the cause of chest pain. There is always a chance that your pain could be related to something serious, such as a heart attack or a blood clot in the lungs. You need to follow up with your doctor. HOME CARE  If antibiotic medicine was given, take it as directed by your doctor. Finish the medicine even if you start to feel better.  For the next few days, avoid activities that bring on chest pain. Continue physical activities as told by your doctor.  Do not use any tobacco products. This includes cigarettes, chewing tobacco, and e-cigarettes.  Avoid drinking alcohol.  Only take medicine as told by your doctor.  Follow your doctor's suggestions for more testing if your chest pain does not go away.  Keep all doctor visits you made. GET HELP IF:  Your chest pain does not go away, even after treatment.  You have a rash with blisters on your chest.  You have a fever. GET HELP RIGHT AWAY IF:   You have more pain or pain that spreads to your arm, neck, jaw, back, or belly (abdomen).  You have shortness of breath.  You cough more than usual or cough up blood.  You have very bad back or belly pain.  You feel sick to your stomach (nauseous) or throw up (vomit).  You have very bad weakness.  You pass out (faint).  You have chills. This is an emergency. Do not wait to see if the problems will go away. Call your local emergency services (911 in [place] Do not drive yourself to the hospital. MAKE SURE YOU:   Understand these instructions.  Will  watch your condition.  Will get help right away if you are not doing well or get worse. Document Released: 04/13/2008 Document Revised: 10/31/2013 Document Reviewed: 04/13/2008 [employer] Patient Information 2015 [place], [place]. This information is not intended to replace advice given to you by your health care provider. Make sure you discuss any questions you have with your health care provider.

## 2015-04-07 ENCOUNTER — Emergency Department (HOSPITAL_COMMUNITY)
Admission: EM | Admit: 2015-04-07 | Discharge: 2015-04-07 | Disposition: A | Discharge status: Home / Self Care | Payer: Medicaid Other | Attending: Emergency Medicine | Admitting: Emergency Medicine

## 2015-04-07 ENCOUNTER — Encounter (HOSPITAL_COMMUNITY): Payer: Self-pay | Admitting: Emergency Medicine

## 2015-04-07 DIAGNOSIS — R197 Diarrhea, unspecified: Secondary | ICD-10-CM | POA: Insufficient documentation

## 2015-04-07 DIAGNOSIS — Z79899 Other long term (current) drug therapy: Secondary | ICD-10-CM | POA: Insufficient documentation

## 2015-04-07 DIAGNOSIS — I10 Essential (primary) hypertension: Secondary | ICD-10-CM | POA: Insufficient documentation

## 2015-04-07 DIAGNOSIS — E119 Type 2 diabetes mellitus without complications: Secondary | ICD-10-CM | POA: Insufficient documentation

## 2015-04-07 DIAGNOSIS — Z720 Tobacco use: Secondary | ICD-10-CM | POA: Insufficient documentation

## 2015-04-07 DIAGNOSIS — R112 Nausea with vomiting, unspecified: Secondary | ICD-10-CM | POA: Insufficient documentation

## 2015-04-07 LAB — CBG MONITORING, ED: Glucose-Capillary: 209 mg/dL — ABNORMAL HIGH (ref 65–99)

## 2015-04-07 MED ORDER — ONDANSETRON HCL 4 MG PO TABS: 4.0000 mg | ORAL_TABLET | Freq: Three times a day (TID) | ORAL | Status: DC | PRN

## 2015-04-07 MED ORDER — ONDANSETRON 8 MG PO TBDP
8.0000 mg | ORAL_TABLET | Freq: Once | ORAL | Status: AC
  Administered 2015-04-07: 8 mg via ORAL
  Filled 2015-04-07: qty 1

## 2015-04-07 NOTE — ED Notes (Signed)
Drinking fluid without difficulty

## 2015-04-07 NOTE — ED Notes (Signed)
Patient given Ginger Ale to drink at this time.

## 2015-04-07 NOTE — ED Notes (Signed)
N&V&D X2 days, not able to tolerate fluids this morning.

## 2015-04-07 NOTE — ED Provider Notes (Signed)
CSN: [id number]     Arrival date & time 04/07/15  9476 History   First MD Initiated Contact with Patient 04/07/15 0715     Chief Complaint  Patient presents with  . Emesis      HPI Pt was seen at 0720. Per pt, c/o gradual onset and persistence of multiple intermittent episodes of N/V/D that began yesterday.   Describes the stools as "watery." Others in household with similar symptoms. Denies abd pain, no CP/SOB, no back pain, no fevers, no black or blood in stools or emesis.     Past Medical History  Diagnosis Date  . Hypertension   . Diabetes mellitus without complication    History reviewed. No pertinent past surgical history.   Family History  Problem Relation Age of Onset  . Asthma Mother   . Diabetes Other   . Heart failure Other   . Cancer Other    History  Substance Use Topics  . Smoking status: Current Every Day Smoker -- 0.50 packs/day    Types: Cigarettes  . Smokeless tobacco: Never Used  . Alcohol Use: Yes     Comment: occas    Review of Systems ROS: Statement: All systems negative except as marked or noted in the HPI; Constitutional: Negative for fever and chills. ; ; Eyes: Negative for eye pain, redness and discharge. ; ; ENMT: Negative for ear pain, hoarseness, nasal congestion, sinus pressure and sore throat. ; ; Cardiovascular: Negative for chest pain, palpitations, diaphoresis, dyspnea and peripheral edema. ; ; Respiratory: Negative for cough, wheezing and stridor. ; ; Gastrointestinal: +N/V/D. Negative for abdominal pain, blood in stool, hematemesis, jaundice and rectal bleeding. . ; ; Genitourinary: Negative for dysuria, flank pain and hematuria. ; ; Musculoskeletal: Negative for back pain and neck pain. Negative for swelling and trauma.; ; Skin: Negative for pruritus, rash, abrasions, blisters, bruising and skin lesion.; ; Neuro: Negative for headache, lightheadedness and neck stiffness. Negative for weakness, altered level of consciousness , altered mental  status, extremity weakness, paresthesias, involuntary movement, seizure and syncope.      Allergies  Review of patient's allergies indicates no known allergies.  Home Medications   Prior to Admission medications   Medication Sig Start Date End Date Taking? Authorizing Provider  celecoxib (CELEBREX) 100 MG capsule Take 1 capsule (100 mg total) by mouth 2 (two) times daily. Patient not taking: Reported on 04/07/2015 02/02/15   Lily Kocher, PA-C  cyclobenzaprine (FLEXERIL) 5 MG tablet Take 1 tablet (5 mg total) by mouth 3 (three) times daily as needed (muscle soreness). Patient not taking: Reported on 04/07/2015 03/26/15   Rolland Porter, MD  diphenhydrAMINE (BENADRYL) 25 MG tablet Take 1 tablet (25 mg total) by mouth every 8 (eight) hours as needed. Patient not taking: Reported on 02/02/2015 08/30/14   Domenic Moras, PA-C  HYDROcodone-acetaminophen (NORCO/VICODIN) 5-325 MG per tablet Take 2 tablets by mouth every 4 (four) hours as needed. Patient not taking: Reported on 02/02/2015 08/28/14   Tanna Furry, MD  ibuprofen (ADVIL,MOTRIN) 800 MG tablet Take 1 tablet (800 mg total) by mouth 3 (three) times daily. Patient not taking: Reported on 02/02/2015 08/28/14   Tanna Furry, MD  metFORMIN (GLUCOPHAGE) 500 MG tablet Take 1 tablet (500 mg total) by mouth 2 (two) times daily with a meal. Patient not taking: Reported on 02/02/2015 08/28/14   Tanna Furry, MD  metroNIDAZOLE (FLAGYL) 500 MG tablet Take 1 tablet (500 mg total) by mouth 2 (two) times daily. Patient not taking: Reported on 04/07/2015 02/02/15  Lily Kocher, PA-C  naproxen (NAPROSYN) 250 MG tablet Take 1 tablet (250 mg total) by mouth 2 (two) times daily. Patient not taking: Reported on 04/07/2015 03/26/15   Rolland Porter, MD  permethrin (ELIMITE) 5 % cream Apply from neck to the rest of body, keep it on for 8-10 hrs then washed off completely.  Repeat in 1 week if no improvement. Patient not taking: Reported on 02/02/2015 08/30/14   Domenic Moras, PA-C   BP  142/97 mmHg  Pulse 115  Temp(Src) 98.4 F (36.9 C) (Oral)  Resp 20  Ht 5\' 4"  (1.626 m)  Wt 210 lb (95.255 kg)  BMI 36.03 kg/m2  SpO2 98%  LMP 03/20/2015    Physical Exam  0725: Physical examination:  Nursing notes reviewed; Vital signs and O2 SAT reviewed;  Constitutional: Well developed, Well nourished, Well hydrated, In no acute distress; Head:  Normocephalic, atraumatic; Eyes: EOMI, PERRL, No scleral icterus; ENMT: Mouth and pharynx normal, Mucous membranes moist; Neck: Supple, Full range of motion, No lymphadenopathy; Cardiovascular: Regular rate and rhythm, No murmur, rub, or gallop; Respiratory: Breath sounds clear & equal bilaterally, No rales, rhonchi, wheezes.  Speaking full sentences with ease, Normal respiratory effort/excursion; Chest: Nontender, Movement normal; Abdomen: Soft, Nontender, Nondistended, Normal bowel sounds; Genitourinary: No CVA tenderness; Extremities: Pulses normal, No tenderness, No edema, No calf edema or asymmetry.; Neuro: AA&Ox3, Major CN grossly intact.  Speech clear. No gross focal motor or sensory deficits in extremities. Climbs on and off stretcher easily by herself. Gait steady.; Skin: Color normal, Warm, Dry.   ED Course  Procedures     EKG Interpretation None      MDM  MDM Reviewed: previous chart, nursing note and vitals Interpretation: labs      Results for orders placed or performed during the hospital encounter of 04/07/15  CBG monitoring, ED  Result Value Ref Range   Glucose-Capillary 209 (H) 65 - 99 mg/dL    0925:  Pt has tol PO well while in the ED without N/V.  No stooling while in the ED.  Abd remains benign, VSS. Feels better and wants to go home now. Pt has been ambulatory around the ED with steady, upright gait, NAD.  Dx and testing d/w pt.  Questions answered.  Verb understanding, agreeable to d/c home with outpt f/u.     Francine Graven, DO 04/11/15 2002

## 2015-04-07 NOTE — Discharge Instructions (Signed)
°Emergency Department Resource Guide °1) Find a Doctor and Pay Out of Pocket °Although you won't have to find out who is covered by your insurance plan, it is a good idea to ask around and get recommendations. You will then need to call the office and see if the doctor you have chosen will accept you as a new patient and what types of options they offer for patients who are self-pay. Some doctors offer discounts or will set up payment plans for their patients who do not have insurance, but you will need to ask so you aren't surprised when you get to your appointment. ° °2) Contact Your Local Health Department °Not all health departments have doctors that can see patients for sick visits, but many do, so it is worth a call to see if yours does. If you don't know where your local health department is, you can check in your phone book. The CDC also has a tool to help you locate your state's health department, and many state websites also have listings of all of their local health departments. ° °3) Find a Walk-in Clinic °If your illness is not likely to be very severe or complicated, you may want to try a walk in clinic. These are popping up all over the country in pharmacies, drugstores, and shopping centers. They're usually staffed by nurse practitioners or physician assistants that have been trained to treat common illnesses and complaints. They're usually fairly quick and inexpensive. However, if you have serious medical issues or chronic medical problems, these are probably not your best option. ° °No Primary Care Doctor: °- Call [facility] at  832-8000 - they can help you locate a primary care doctor that  accepts your insurance, provides certain services, etc. °- Physician Referral Service- 1-800-533-3463 ° °Chronic Pain Problems: °Organization         Address  Phone   Notes  °[facility]  (336) 297-2271 Patients need to be referred by their primary care doctor.  ° °Medication  Assistance: °Organization         Address  Phone   Notes  °[place] [facility] [address] °[place], [place] [address] (336) 641-8030 --Must be a resident of [place] °-- Must have NO insurance coverage whatsoever (no Medicaid/ Medicare, etc.) °-- The pt. MUST have a primary care doctor that directs their care regularly and follows them in the community °  °[facility]  (866) 331-1348   °[employer]  (888) 892-1162   ° °Agencies that provide inexpensive medical care: °Organization         Address  Phone   Notes  °[facility]  (336) 832-8035   °[facility]    (336) 832-7272   °[facility] [address] °[place], [place] [address] (336) 832-4777   °[facility] [address], °[place] (336) 271-4999   °[facility]    (336) 373-0678   °[facility]    (336) 272-1050   °[facility] ° [address], [place] Phone:  (336) 832-4444, Fax:  (336) 832-4440 Hours of Operation:  9 am - 6 pm, M-F.  Also accepts Medicaid/Medicare and self-pay.  °[facility] ° [address], [place] Phone: (336) 832-3150, Fax: (336) 832-3151. Hours of Operation:  8:30 am - 5:30 pm, M-F.  Also accepts Medicaid and self-pay.  °[facility] [place] [address], [place] Phone: (336) 878-6027   °[facility] [address], [place], [place] (336)723-1848, Ext. 123 Mondays & Thursdays: 7-9 AM.  First 15 patients are seen on a first come, first serve basis. °  ° °Medicaid-accepting [place] Providers: ° °Organization         Address  Phone   Notes  °[facility] [address], [place] (336) 641-2100 Also accepts self-pay patients.  °[facility] [address], [place] ° (336) 856-9996   °[facility] [address], [place]  (336) 288-8857   °[facility] [address] [place] [address], [place] (336) 299-7000   °Veita Bland [address], [place]  ° (336) 373-1557 Only accepts [place] Access Medicaid patients after they have their name applied to their card.  ° °Self-Pay (no insurance) in [place]: ° °Organization         Address  Phone   Notes  °Sickle Cell Patients, [facility] [address], [place] (336) 832-1970   °[facility] [address], [place] (336) 832-4400   °[facility] [place] ° [address], [place] (336) 992-4800   °[facility]/Dr. Osei-Bonsu ° [address] [place] [address], [place] or [address], [place] (336) 841-8500 Phone number for both [place] and [place] locations is the same.  °[facility] [address], [place] (336) 299-0000   °[facility] [address] [place] [address], [place] or [address] (336) 852-7530 °(336) 878-2260   °[facility] [address], [place] (336) 350-1642, phone; (336) 294-5005, fax Sees patients 1st and 3rd Saturday of every month.  Must not qualify for public or private insurance (i.e. Medicaid, Medicare, [place] Health Choice, Veterans' Benefits) • Household income should be no more than 200% of the poverty level •The clinic cannot treat you if you are pregnant or think you are pregnant • Sexually transmitted diseases are not treated at the clinic.  ° ° °Dental Care: °Organization         Address  Phone  Notes  °[place] [facility] [address], [place] (336) 641-6152 Accepts children up to age 21 who are enrolled in Medicaid or [place] Health Choice; pregnant women with a Medicaid card; and children who have applied for Medicaid or [place] Health Choice, but were declined, whose parents can pay a reduced fee at time of service.  °[place]  [facility] [place]  [address], [place] (336) 641-7733 Accepts children up to age 21 who are enrolled in Medicaid or [place] Health Choice; pregnant women with a Medicaid card; and children who have applied for Medicaid or  Health Choice, but were declined, whose parents can pay a reduced fee at time of service.  °[facility] ° [address], [place] (336) 641-4533 Patients are seen by appointment only. Walk-ins are not accepted. [facility] will see patients 18 years of age and older. °Monday - Tuesday (8am-5pm) °Most Wednesdays (8:30-5pm) °$30 per visit, cash only  °[facility] ° [address], [place] (336) 641-4533 Patients are seen by appointment only. Walk-ins are not accepted. [facility] will see patients 18 years of age and older. °One   Wednesday Evening (Monthly: Volunteer Based).  $30 per visit, cash only  °[facility]  (919) 537-3737 for adults; Children under age 4, call Graduate Pediatric Dentistry at ([phone]) [phone]. Children aged 4-14, please call ([phone]) [phone] to request a pediatric application. ° Dental services are provided in all areas of dental care including fillings, crowns and bridges, complete and partial dentures, implants, gum treatment, root canals, and extractions. Preventive care is also provided. Treatment is provided to both adults and children. °Patients are selected via a lottery and there is often a waiting list. °  °[facility] [address], °[place] ° (336) 763-8833 [email or web address] °  °[facility] [address], [place], [place] (336)723-1848, Ext. 123 Second and Fourth Thursday of each month, opens at 6:30 AM; Clinic ends at 9 AM.  Patients are seen on a first-come first-served basis, and a limited number are seen during each clinic.  ° °[facility] ° [address], [place], [place] (336) [phone]    Eligibility Requirements °You must have lived in [place], [place], or [place] counties for at least the last three months. °  You cannot be eligible for state or federal sponsored healthcare insurance, including Veterans Administration, Medicaid, or Medicare. °  You generally cannot be eligible for healthcare insurance through your employer.  °  How to apply: °Eligibility screenings are held every Tuesday and Wednesday afternoon from 1:00 pm until 4:00 pm. You do not need an appointment for the interview!  °[facility] [address], [place], [place] 336-631-2330   °[facility]  336-342-8273   °[facility]  336-703-3100   °[facility]  336-570-6415   ° °Behavioral Health Resources in the Community: °Intensive Outpatient Programs °Organization         Address  Phone  Notes  °[facility] [address], [place], [place] 336-878-6098   °[facility] [address], [place], [place] 336-832-9800   °[facility] [address], [place], [place] ° 336-882-2125   °[facility] [address],  °[place], [place] 1-800-853-5163 or 336-641-4981   °Substance Abuse Resources °Organization         Address  Phone  Notes  °[facility]  336-882-2125   °[facility]  336-784-9470   °[facility] [place] [facility]  336-285-9073   °[facility]  336-845-3988   °Residential & Outpatient Substance Abuse Program  1-800-659-3381   °Psychological Services °Organization         Address  Phone  Notes  °[facility]  336- 832-9600   °[facility]  336- 378-7881   °[facility] [address], [place] 1-800-853-5163 or 336-641-4981   ° °Mobile Crisis Teams °Organization         Address  Phone  Notes  °[facility], Mobile Crisis Care Unit  1-877-626-1772   °Assertive °[facility] ° [address]  [place], [place] 336-834-9664   °Sharon DeEsch [address] °[place] 336-554-5454   ° °Self-Help/Support Groups °Organization         Address  Phone             Notes  °[facility] [place] - variety of support groups  336- 373-1402 Call for more information  °Narcotics Anonymous (NA), [facility] [address], °[place]  2 meetings at this location  ° °  Residential Treatment Programs Organization         Address  Phone  Notes  [facility] [address],    [place]  1-414-388-0812   [facility]  [address] [place] [address], [place], [place]   [place], [place] [phone] Admissions: 8am-3pm M-F  [facility] [place] [address]    [place], [place] 119-417-4081   [facility] [address] [place], [place], [place]   [facility] [address],  [place], [place]   [facility] Intensive Outpatient [address], [place], [place]   [facility] ([place]  [place], [place] 1-469-578-3759 or (226)069-4874   [facility] [address], [place], [place] Accepts Medicaid  [facility] [address]  [place] 1-(208)054-1154 Substance Abuse/Addiction Treatment   [facility]         Address  Phone  Notes  [facility]  419-838-9681   Domenic Schwab, PhD [address] [place], [place]   (631)402-2091 or (867)459-5866   [place], [place] [phone]   [facility] [address], [place], [place] 331-831-3082 Insurance/Medicaid/sponsorship through [facility] [address]                                    [place], [place] [phone] [place] [address], [place] [phone]    Dr. Adele Schilder  [phone]   [facility] Dept. 1) [address], [place] 2) [place] 3)  [address], [place] [phone]  [phone]   [place] 782-171-1908 or (251) [phone] (After Hours)      Take the prescription as directed.  Increase your fluid intake (ie:  Gatoraide) for the next few days, as discussed.  Eat a bland diet and advance to your regular diet slowly as you can tolerate it.   Avoid full strength juices, as well as milk and milk products until your diarrhea has resolved.   Call your regular medical doctor Tuesday to schedule a follow up appointment this week.  Return to the Emergency Department immediately if not improving (or even worsening) despite taking the medicines as prescribed, any black or bloody stool or vomit, if you develop a fever over "101," or for any other concerns.

## 2015-06-17 ENCOUNTER — Encounter (HOSPITAL_BASED_OUTPATIENT_CLINIC_OR_DEPARTMENT_OTHER): Payer: Self-pay

## 2015-06-17 ENCOUNTER — Emergency Department (HOSPITAL_BASED_OUTPATIENT_CLINIC_OR_DEPARTMENT_OTHER): Payer: Medicaid Other

## 2015-06-17 ENCOUNTER — Emergency Department (HOSPITAL_BASED_OUTPATIENT_CLINIC_OR_DEPARTMENT_OTHER)
Admission: EM | Admit: 2015-06-17 | Discharge: 2015-06-17 | Disposition: A | Discharge status: Home / Self Care | Payer: Medicaid Other | Attending: Emergency Medicine | Admitting: Emergency Medicine

## 2015-06-17 DIAGNOSIS — R202 Paresthesia of skin: Secondary | ICD-10-CM | POA: Insufficient documentation

## 2015-06-17 DIAGNOSIS — I10 Essential (primary) hypertension: Secondary | ICD-10-CM | POA: Insufficient documentation

## 2015-06-17 DIAGNOSIS — E119 Type 2 diabetes mellitus without complications: Secondary | ICD-10-CM | POA: Insufficient documentation

## 2015-06-17 DIAGNOSIS — Z720 Tobacco use: Secondary | ICD-10-CM | POA: Insufficient documentation

## 2015-06-17 DIAGNOSIS — Z79899 Other long term (current) drug therapy: Secondary | ICD-10-CM | POA: Insufficient documentation

## 2015-06-17 DIAGNOSIS — M542 Cervicalgia: Secondary | ICD-10-CM | POA: Insufficient documentation

## 2015-06-17 NOTE — ED Provider Notes (Signed)
CSN: [id number]     Arrival date & time 06/17/15  1335 History   First MD Initiated Contact with Patient 06/17/15 1418     Chief Complaint  Patient presents with  . Numbness    The history is provided by the patient. No language interpreter was used.     Mallory Mcdowell is a 29 year old female with a PMH of HTN and DM who presents to the ED with numbness and tingling to both arms x 1 week. She denies recent injury. She reports her numbness and tingling is constant. She denies exacerbating factors. She reports she tried ibuprofen, which gave her minimal symptom relief. She states a a few days ago, she had headache, blurred vision, and felt dizzy while at work. She reports she drank water and her symptoms resolved. She denies fever, chills, history of cancer, history of IVDU, anticoagulant use.  Past Medical History  Diagnosis Date  . Hypertension   . Diabetes mellitus without complication    History reviewed. No pertinent past surgical history. Family History  Problem Relation Age of Onset  . Asthma Mother   . Diabetes Other   . Heart failure Other   . Cancer Other    History  Substance Use Topics  . Smoking status: Current Every Day Smoker -- 0.50 packs/day    Types: Cigarettes  . Smokeless tobacco: Never Used  . Alcohol Use: Yes     Comment: occas   OB History    No data available     Review of Systems  Constitutional: Negative for fever, chills, diaphoresis, activity change, appetite change, fatigue and unexpected weight change.  Respiratory: Negative for shortness of breath.   Cardiovascular: Negative for chest pain and palpitations.  Gastrointestinal: Negative for nausea, vomiting, abdominal pain, diarrhea and constipation.  Musculoskeletal: Positive for neck pain. Negative for back pain.  Skin: Negative for color change, pallor, rash and wound.  Neurological: Positive for numbness. Negative for syncope, facial asymmetry, speech difficulty and weakness.       Reports  headache, dizziness, blurred vision a few days ago, now resolved. Reports numbness and tingling to bilateral upper extremities.    Allergies  Review of patient's allergies indicates no known allergies.  Home Medications   Prior to Admission medications   Medication Sig Start Date End Date Taking? Authorizing Provider  atenolol (TENORMIN) 25 MG tablet Take by mouth daily.   Yes Historical Provider, MD  lisinopril (PRINIVIL,ZESTRIL) 5 MG tablet Take 5 mg by mouth daily.   Yes Historical Provider, MD  metFORMIN (GLUCOPHAGE) 500 MG tablet Take by mouth 2 (two) times daily with a meal.   Yes Historical Provider, MD    BP 121/80 mmHg  Pulse 74  Temp(Src) 97.9 F (36.6 C) (Oral)  Resp 16  Ht 5\' 6"  (1.676 m)  Wt 208 lb (94.348 kg)  BMI 33.59 kg/m2  SpO2 100%  LMP 06/16/2015 Physical Exam  Constitutional: She is oriented to person, place, and time. She appears well-developed and well-nourished. No distress.  HENT:  Head: Normocephalic and atraumatic.  Right Ear: External ear normal.  Left Ear: External ear normal.  Nose: Nose normal.  Mouth/Throat: Oropharynx is clear and moist.  Eyes: Conjunctivae and EOM are normal. Pupils are equal, round, and reactive to light.  Neck: Normal range of motion. Neck supple.  Cardiovascular: Normal rate, regular rhythm, normal heart sounds and intact distal pulses.   Pulmonary/Chest: Effort normal and breath sounds normal. No respiratory distress. She has no wheezes. She  has no rales.  Abdominal: Soft. Bowel sounds are normal. She exhibits no distension and no mass. There is no tenderness. There is no rebound and no guarding.  Musculoskeletal: Normal range of motion. She exhibits no edema.  Mild tenderness to palpation of cervical spine. No step-off or deformity.  Neurological: She is alert and oriented to person, place, and time. She has normal strength. No cranial nerve deficit or sensory deficit.  Skin: Skin is warm and dry. No rash noted. She is  not diaphoretic. No erythema. No pallor.  Psychiatric: She has a normal mood and affect. Her behavior is normal. Judgment and thought content normal.  Nursing note and vitals reviewed.   ED Course  Procedures (including critical care time)  Imaging Review Ct Head Wo Contrast  06/17/2015   CLINICAL DATA:  Bilateral arm tingling, numbness  EXAM: CT HEAD WITHOUT CONTRAST  TECHNIQUE: Contiguous axial images were obtained from the base of the skull through the vertex without intravenous contrast.  COMPARISON:  None.  FINDINGS: No skull fracture is noted. Paranasal sinuses and mastoid air cells are unremarkable. No intracranial hemorrhage, mass effect or midline shift. No acute cortical infarction. No mass lesion is noted on this unenhanced scan. The gray and white-matter differentiation is preserved. No hydrocephalus.  IMPRESSION: No acute intracranial abnormality.   Electronically Signed   By: Lahoma Crocker M.D.   On: 06/17/2015 15:20     MDM   Final diagnoses:  Paresthesia of upper extremity   29 year old female with one week of numbness and tingling to bilateral upper extremities, R>L. Patient is a poor historian. No neurological deficits and normal neuro exam. Strength and sensation intact. Full range of motion of upper extremities bilaterally. No fever, night sweats, weight loss, h/o cancer, IVDU, anticoagulant use. Mild tenderness to palpation of cervical spine, no step-off or deformity. CT head negative for acute intracranial abnormality. Symptoms somewhat dermatomal in distribution over the right upper extremity, most likely due to radiculopathy involving cervical spine. Patient to follow-up with neurology for further evaluation and management. Return precautions discussed.    BP 109/68 mmHg  Pulse 88  Temp(Src) 97.9 F (36.6 C) (Oral)  Resp 16  Ht 5\' 6"  (1.676 m)  Wt 208 lb (94.348 kg)  BMI 33.59 kg/m2  SpO2 100%  LMP 06/16/2015    Marella Chimes, PA-C 06/17/15  1818  Debby Freiberg, MD 06/19/15 2251

## 2015-06-17 NOTE — ED Notes (Signed)
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

## 2015-06-17 NOTE — ED Notes (Addendum)
RT arm numbness. Complains of headache,blurred vision, tingling. Pt states on new meds for diabetes and HTN

## 2015-06-17 NOTE — Discharge Instructions (Signed)
1. Medications: usual home medications 2. Treatment: rest, drink plenty of fluids 3. Follow Up: please followup with [facility] for discussion of your diagnoses and further evaluation after today's visit; please return to the ER for new or worsening symptoms   Paresthesia Paresthesia is a burning or prickling feeling. This feeling can happen in any part of the body. It often happens in the hands, arms, legs, or feet. HOME CARE  Avoid drinking alcohol.  Try massage or needle therapy (acupuncture) to help with your problems.  Keep all doctor visits as told. GET HELP RIGHT AWAY IF:   You feel weak.  You have trouble walking or moving.  You have problems speaking or seeing.  You feel confused.  You cannot control when you poop (bowel movement) or pee (urinate).  You lose feeling (numbness) after an injury.  You pass out (faint).  Your burning or prickling feeling gets worse when you walk.  You have pain, cramps, or feel dizzy.  You have a rash. MAKE SURE YOU:   Understand these instructions.  Will watch your condition.  Will get help right away if you are not doing well or get worse. Document Released: 10/08/2008 Document Revised: 01/18/2012 Document Reviewed: 07/17/2011 [facility] Patient Information 2015 [place], [place]. This information is not intended to replace advice given to you by your health care provider. Make sure you discuss any questions you have with your health care provider.   Emergency Department Resource Guide 1) Find a Doctor and Pay Out of Pocket Although you won't have to find out who is covered by your insurance plan, it is a good idea to ask around and get recommendations. You will then need to call the office and see if the doctor you have chosen will accept you as a new patient and what types of options they offer for patients who are self-pay. Some doctors offer discounts or will set up payment plans for their patients who do not have  insurance, but you will need to ask so you aren't surprised when you get to your appointment.  2) Contact Your Local Health Department Not all health departments have doctors that can see patients for sick visits, but many do, so it is worth a call to see if yours does. If you don't know where your local health department is, you can check in your phone book. The CDC also has a tool to help you locate your state's health department, and many state websites also have listings of all of their local health departments.  3) Find a [facility] If your illness is not likely to be very severe or complicated, you may want to try a walk in clinic. These are popping up all over the country in pharmacies, drugstores, and shopping centers. They're usually staffed by nurse practitioners or physician assistants that have been trained to treat common illnesses and complaints. They're usually fairly quick and inexpensive. However, if you have serious medical issues or chronic medical problems, these are probably not your best option.  No Primary Care Doctor: - Call Health Connect at  [phone] - they can help you locate a primary care doctor that  accepts your insurance, provides certain services, etc. - Physician Referral Service- [phone]  Chronic Pain Problems: Organization         Address  Phone   Notes  [facility]  [phone] Patients need to be referred by their primary care doctor.   Medication Assistance: Organization  Address  Phone   Notes  [facility] Medication [facility] [address] [place], [place] [address] [phone] --Must be a resident of [facility] -- Must have NO insurance coverage whatsoever (no Medicaid/ Medicare, etc.) -- The pt. MUST have a primary care doctor that directs their care regularly and follows them in the community   MedAssist  [phone]   [employer]  [phone]    Agencies that provide  inexpensive medical care: Organization         Address  Phone   Notes  [place]  [phone]   [facility]    [phone]   [facility] [place], [place] [address] [phone]   [place] [address], [place] [phone]   Planned Parenthood    [phone]   [facility]    [phone]   [place] and [place] [address], [place] Phone:  [phone], Fax:  [phone] Hours of Operation:  9 am - 6 pm, M-F.  Also accepts Medicaid/Medicare and self-pay.  [facility] for [place] [address], [address], [place] Phone: [phone], Fax: [phone]. Hours of Operation:  8:30 am - 5:30 pm, M-F.  Also accepts Medicaid and self-pay.  [facility] [place] [address] [place] Phone: [phone]   [place], [place], [place] [phone], Ext. [phone] Mondays & Thursdays: 7-9 AM.  First 15 patients are seen on a first come, first serve basis.    [place] Providers:  Organization         Address  Phone   Notes  [facility] [address], [place] [phone] Also accepts self-pay patients.  [facility] [address], [place]  [phone]   [place], [address], [place] [phone]   [facility] [address] [place] [phone]   Lucianne Lei [address], [place]   [phone] Only accepts [place] Access [place] patients after they have their name applied to their card.   Self-Pay (no insurance) in [facility]:  Organization         Address  Phone   Notes  Sickle Cell Patients, [facility] [phone]   [facility] [place] [phone]   [facility] [place]  [place], [place], [place] [phone]   [facility]/Dr. Osei-Bonsu  [address] [place] or [address], [place] [phone] Phone number for both [place] and [place] locations is the same.  Urgent Medical and [facility] [address], [place] [phone]   [facility] [address], [place] or [address] [phone]   [facility] [address] [place] [phone], phone; [phone], fax Sees patients 1st and 3rd Saturday of every month.  Must not qualify for public or private insurance (i.e. Medicaid, Medicare, [place] Health Choice, Veterans' Benefits)  Household income should be no more than 200% of the poverty level The clinic cannot treat you if you are pregnant or think you  are pregnant  Sexually transmitted diseases are not treated at the clinic.    Dental Care: Organization         Address  Phone  Notes  [facility] Department of [facility] [phone] Accepts children up to age 74 who are enrolled in [place] or [place]; pregnant women with a Medicaid card; and children who have applied for Medicaid or [place] Health Choice, but were declined, whose parents can pay a reduced fee at time of service.  [facility] Department of [facility]  [address], [place] [phone] Accepts children up to age 28 who are enrolled in [place] or [place]; pregnant women with a Medicaid card; and children who have applied for Medicaid or  Health Choice, but were declined, whose parents can pay a reduced fee at time of service.  [facility]  [place] [phone] Patients are seen by appointment only. Walk-ins are  not accepted. [place] will see patients 30 years of age and older. Monday - Tuesday (8am-5pm) Most Wednesdays (8:30-5pm) $30 per visit, cash only  [facility] Adult Dental Access PROGRAM  [address], [facility] [phone] Patients are seen by appointment only. Walk-ins are not accepted. [place] will see patients 27 years of age and older. One Wednesday Evening (Monthly: Volunteer Based).  $30 per visit, cash only  [place]  [phone] for adults; Children under age 26, call Graduate Pediatric Dentistry at [phone]. Children aged 10-14, please call [phone] to request a pediatric application.  Dental services are provided in all areas of dental care including fillings, crowns and bridges, complete and partial dentures, implants, gum treatment, root canals, and extractions. Preventive care is also provided. Treatment is provided to both adults and children. Patients are selected via a lottery and there is often a waiting list.   [facility] [address], [place]  [phone] [email or web address]   [facility] [address] [place], [place] [phone], Ext. [phone] Second and Fourth Thursday of each month, opens at 6:30 AM; Clinic ends at 9 AM.  Patients are seen on a first-come first-served basis, and a limited number are seen during each clinic.   [facility]  [address] Hillard Danker [place], [place] [phone]   Eligibility Requirements You must have lived in [place], [place], or [place] counties for at least the last three months.   You cannot be eligible for state or federal sponsored [employer], including [employer], [place], or [employer].   You generally cannot be eligible for healthcare insurance through your employer.    How to apply: Eligibility screenings are held every Tuesday and Wednesday afternoon from 1:00 pm until 4:00 pm. You do not need an appointment for  the interview!  [facility] [address], [place], [place]   [place]  [place] Department  [place]  [phone]    Behavioral Health Resources in the Community: Intensive Outpatient Programs Organization         Address  Phone  Notes  [place]. [address], [place], [place] [phone]   [facility] Outpatient [address] [place], [place]   ADS: Alcohol & Drug Svcs [address], [place], [place]   [place] [address],  [place], [place] or [phone]   Substance Abuse Resources Organization         Address  Phone  Notes  Alcohol and Drug Services  [phone]   [place]  [phone]   [facility] [place]   Chinita Pester  [phone]   Residential & Outpatient Substance Abuse Program  [phone]   Psychological Services Organization         Address  Phone  Notes  [facility] [place]  [place]  [phone]   [place] [address], [place] or [phone]    Mobile Crisis Teams Organization         Address  Phone  Notes  [facility], Mobile Crisis Care Unit  [phone]   [facility]  [address] [place], [place]   [place] [address] [place] [phone]    Self-Help/Support Groups Organization         Address  Phone             Notes  [place]. of [place] - variety of support groups  [place] Call for more information  Narcotics Anonymous (NA), [facility]aring [facility] [address], [employer] [place]  2 meetings at this location   Special educational needs teacher         Address  Phone  Notes  [facility]  [place],    [place]  [phone]   [facility]  [address] [place] [address], [place], [place]   [place], [place] [phone] Admissions: 8am-3pm M-F  Incentives Substance [place] [address]    [place], [place] [phone]   [facility] [address] [employer], [place], [place]   [facility] [address],  [place], [place]   [facility] - Intensive Outpatient [address], [place], [place]   [facility] ([place] [employer]  [place], [place] [phone] or [phone]   [facility]idential Treatment Services (RTS) [address] [place], [place] Accepts Medicaid  [facility] [place] [address],  [place] [phone] Substance Abuse/Addiction Treatment   [facility] Organization         Address  Phone  Notes  [facility]  [phone]   Domenic Schwab, PhD [address] Arlis Port[place], [place]   [phone] or [phone]   [place], [place] [phone]   [facility] [address] [place], [place] [phone] Insurance/Medicaid/sponsorship through [facility] and Families [address]                                    [place], [place] [phone] [place] [address] [place], [place] [phone]    Dr. Adele Schilder  [phone]   [facility] Dept. 1) [address], [place] 2) [place] 3)  [place] [address], [place] [phone]  [phone]   [place] [phone])  [phone] or [phone] (After Hours)

## 2015-08-09 ENCOUNTER — Emergency Department (HOSPITAL_COMMUNITY)
Admission: EM | Admit: 2015-08-09 | Discharge: 2015-08-09 | Disposition: A | Discharge status: Home / Self Care | Payer: Medicaid Other | Attending: Emergency Medicine | Admitting: Emergency Medicine

## 2015-08-09 ENCOUNTER — Encounter (HOSPITAL_COMMUNITY): Payer: Self-pay

## 2015-08-09 ENCOUNTER — Emergency Department (HOSPITAL_COMMUNITY): Payer: Medicaid Other

## 2015-08-09 DIAGNOSIS — M545 Low back pain, unspecified: Secondary | ICD-10-CM

## 2015-08-09 DIAGNOSIS — I10 Essential (primary) hypertension: Secondary | ICD-10-CM | POA: Insufficient documentation

## 2015-08-09 DIAGNOSIS — Z720 Tobacco use: Secondary | ICD-10-CM | POA: Insufficient documentation

## 2015-08-09 DIAGNOSIS — Z79899 Other long term (current) drug therapy: Secondary | ICD-10-CM | POA: Insufficient documentation

## 2015-08-09 DIAGNOSIS — E119 Type 2 diabetes mellitus without complications: Secondary | ICD-10-CM | POA: Insufficient documentation

## 2015-08-09 DIAGNOSIS — M778 Other enthesopathies, not elsewhere classified: Secondary | ICD-10-CM | POA: Insufficient documentation

## 2015-08-09 DIAGNOSIS — R109 Unspecified abdominal pain: Secondary | ICD-10-CM | POA: Insufficient documentation

## 2015-08-09 DIAGNOSIS — Z3202 Encounter for pregnancy test, result negative: Secondary | ICD-10-CM | POA: Insufficient documentation

## 2015-08-09 LAB — URINALYSIS, ROUTINE W REFLEX MICROSCOPIC
Bilirubin Urine: NEGATIVE
GLUCOSE, UA: NEGATIVE mg/dL
HGB URINE DIPSTICK: NEGATIVE
Ketones, ur: NEGATIVE mg/dL
Leukocytes, UA: NEGATIVE
Nitrite: NEGATIVE
Protein, ur: NEGATIVE mg/dL
SPECIFIC GRAVITY, URINE: 1.025 (ref 1.005–1.030)
Urobilinogen, UA: 1 mg/dL (ref 0.0–1.0)
pH: 6 (ref 5.0–8.0)

## 2015-08-09 LAB — POC URINE PREG, ED: Preg Test, Ur: NEGATIVE

## 2015-08-09 MED ORDER — CYCLOBENZAPRINE HCL 10 MG PO TABS: 10.0000 mg | ORAL_TABLET | Freq: Three times a day (TID) | ORAL | Status: DC | PRN

## 2015-08-09 MED ORDER — IBUPROFEN 800 MG PO TABS: 800.0000 mg | ORAL_TABLET | Freq: Three times a day (TID) | ORAL | Status: DC

## 2015-08-09 NOTE — ED Notes (Signed)
Patient c/o left flank pain, and right arm pain with numbness. Patient states the pain got worse after she woke up this morning.

## 2015-08-09 NOTE — Discharge Instructions (Signed)
Back Pain, Adult Back pain is very common. The pain often gets better over time. The cause of back pain is usually not dangerous. Most people can learn to manage their back pain on their own.  HOME CARE   Stay active. Start with short walks on flat ground if you can. Try to walk farther each day.  Do not sit, drive, or stand in one place for more than 30 minutes. Do not stay in bed.  Do not avoid exercise or work. Activity can help your back heal faster.  Be careful when you bend or lift an object. Bend at your knees, keep the object close to you, and do not twist.  Sleep on a firm mattress. Lie on your side, and bend your knees. If you lie on your back, put a pillow under your knees.  Only take medicines as told by your doctor.  Put ice on the injured area.  Put ice in a plastic bag.  Place a towel between your skin and the bag.  Leave the ice on for 15-20 minutes, 03-04 times a day for the first 2 to 3 days. After that, you can switch between ice and heat packs.  Ask your doctor about back exercises or massage.  Avoid feeling anxious or stressed. Find good ways to deal with stress, such as exercise. GET HELP RIGHT AWAY IF:   Your pain does not go away with rest or medicine.  Your pain does not go away in 1 week.  You have new problems.  You do not feel well.  The pain spreads into your legs.  You cannot control when you poop (bowel movement) or pee (urinate).  Your arms or legs feel weak or lose feeling (numbness).  You feel sick to your stomach (nauseous) or throw up (vomit).  You have belly (abdominal) pain.  You feel like you may pass out (faint). MAKE SURE YOU:   Understand these instructions.  Will watch your condition.  Will get help right away if you are not doing well or get worse. Document Released: 04/13/2008 Document Revised: 01/18/2012 Document Reviewed: 02/27/2014 [facility] Patient Information 2015 [place], [place]. This information is not intended  to replace advice given to you by your health care provider. Make sure you discuss any questions you have with your health care provider.  Tendinitis Tendinitis is swelling and inflammation of the tendons. Tendons are band-like tissues that connect muscle to bone. Tendinitis commonly occurs in the:   Shoulders (rotator cuff).  Heels (Achilles tendon).  Elbows (triceps tendon). CAUSES Tendinitis is usually caused by overusing the tendon, muscles, and joints involved. When the tissue surrounding a tendon (synovium) becomes inflamed, it is called tenosynovitis. Tendinitis commonly develops in people whose jobs require repetitive motions. SYMPTOMS  Pain.  Tenderness.  Mild swelling. DIAGNOSIS Tendinitis is usually diagnosed by physical exam. Your health care provider may also order X-rays or other imaging tests. TREATMENT Your health care provider may recommend certain medicines or exercises for your treatment. HOME CARE INSTRUCTIONS   Use a sling or splint for as long as directed by your health care provider until the pain decreases.  Put ice on the injured area.  Put ice in a plastic bag.  Place a towel between your skin and the bag.  Leave the ice on for 15-20 minutes, 3-4 times a day, or as directed by your health care provider.  Avoid using the limb while the tendon is painful. Perform gentle range of motion exercises only as directed  by your health care provider. Stop exercises if pain or discomfort increase, unless directed otherwise by your health care provider.  Only take over-the-counter or prescription medicines for pain, discomfort, or fever as directed by your health care provider. SEEK MEDICAL CARE IF:   Your pain and swelling increase.  You develop new, unexplained symptoms, especially increased numbness in the hands. MAKE SURE YOU:   Understand these instructions.  Will watch your condition.  Will get help right away if you are not doing well or get  worse. Document Released: 10/23/2000 Document Revised: 03/12/2014 Document Reviewed: 01/12/2011 [facility] Patient Information 2015 [place], [place]. This information is not intended to replace advice given to you by your health care provider. Make sure you discuss any questions you have with your health care provider.

## 2015-08-09 NOTE — ED Provider Notes (Signed)
CSN: [id number]     Arrival date & time 08/09/15  2012 History   First MD Initiated Contact with Patient 08/09/15 2021    By signing my name below, I, Mallory Mcdowell, attest that this documentation has been prepared under the direction and in the presence of Orpah Greek, MD. Electronically Signed: Hilda Mcdowell, ED Scribe. 08/09/2015. 8:34 PM.  Chief Complaint  Patient presents with  . Flank Pain      The history is provided by the patient. No language interpreter was used.   HPI Comments: Mallory Mcdowell is a 29 y.o. female who presents to the Emergency Department complaining of constant left flank pain with associated intermittent right forearm and elbow pain and numbness from her elbow to her hand that has been present since she woke up this morning. Pt states that she recently stopped taking her diabetes medication as frequently. Pt reports that her right forearm and hand go numb randomly, and occurs frequently when she is at work. Pt denies dysuria or any other symptoms.     Past Medical History  Diagnosis Date  . Hypertension   . Diabetes mellitus without complication    History reviewed. No pertinent past surgical history. Family History  Problem Relation Age of Onset  . Asthma Mother   . Diabetes Other   . Heart failure Other   . Cancer Other    Social History  Substance Use Topics  . Smoking status: Current Every Day Smoker -- 0.50 packs/day    Types: Cigarettes  . Smokeless tobacco: Never Used  . Alcohol Use: Yes     Comment: occas   OB History    No data available     Review of Systems  Genitourinary: Negative for dysuria.  Musculoskeletal: Positive for myalgias and arthralgias.  Neurological: Positive for numbness.  All other systems reviewed and are negative.     Allergies  Review of patient's allergies indicates no known allergies.  Home Medications   Prior to Admission medications   Medication Sig Start Date End Date Taking?  Authorizing Provider  atenolol (TENORMIN) 25 MG tablet Take by mouth daily.    Historical Provider, MD  lisinopril (PRINIVIL,ZESTRIL) 5 MG tablet Take 5 mg by mouth daily.    Historical Provider, MD  metFORMIN (GLUCOPHAGE) 500 MG tablet Take by mouth 2 (two) times daily with a meal.    Historical Provider, MD   BP 124/79 mmHg  Pulse 94  Temp(Src) 97.8 F (36.6 C) (Oral)  Resp 18  Ht 5\' 4"  (1.626 m)  Wt 208 lb (94.348 kg)  BMI 35.69 kg/m2  SpO2 100%  LMP 07/19/2015 Physical Exam  Constitutional: She is oriented to person, place, and time. She appears well-developed and well-nourished. No distress.  HENT:  Head: Normocephalic and atraumatic.  Right Ear: Hearing normal.  Left Ear: Hearing normal.  Nose: Nose normal.  Mouth/Throat: Oropharynx is clear and moist and mucous membranes are normal.  Eyes: Conjunctivae and EOM are normal. Pupils are equal, round, and reactive to light.  Neck: Normal range of motion. Neck supple.  Cardiovascular: Regular rhythm, S1 normal and S2 normal.  Exam reveals no gallop and no friction rub.   No murmur heard. Pulmonary/Chest: Effort normal and breath sounds normal. No respiratory distress. She exhibits no tenderness.  Abdominal: Soft. Normal appearance and bowel sounds are normal. There is no hepatosplenomegaly. There is no tenderness. There is no rebound, no guarding, no tenderness at McBurney's point and negative Murphy's sign. No hernia.  Musculoskeletal: Normal range of motion.  Left lateral soft tissue tenderness  Right arm tenderness at radial head Normal ROM  Neurological: She is alert and oriented to person, place, and time. She has normal strength. No cranial nerve deficit or sensory deficit. Coordination normal. GCS eye subscore is 4. GCS verbal subscore is 5. GCS motor subscore is 6.  Skin: Skin is warm, dry and intact. No rash noted. No cyanosis.  Psychiatric: She has a normal mood and affect. Her speech is normal and behavior is normal.  Thought content normal.  Nursing note and vitals reviewed.   ED Course  Procedures (including critical care time)  DIAGNOSTIC STUDIES: Oxygen Saturation is 100% on room air, normal by my interpretation.    COORDINATION OF CARE: 8:29 PM Discussed treatment plan with pt at bedside and pt agreed to plan.   Labs Review Labs Reviewed - No data to display  Imaging Review No results found. I have personally reviewed and evaluated these images and lab results as part of my medical decision-making.   EKG Interpretation None      MDM   Final diagnoses:  None  tendinitis Back pain   Patient presents to the ER with multiple complaints. Patient reports that she has been experiencing pain in the right arm for some time. Pain comes and goes. It is centered around the elbow and proximal forearm, causes numbness and tingling at times. It seems to worsen when she works. Examination revealed minor tenderness over the radial head area, but no deformity. There is no swelling, redness or warmth of the joints. X-ray was negative.  She complains of intermittent pain "in her kidney". She describes pain in the left lower back area. Area that is tender is lateral to the CVA area, and soft tissues. Urinalysis was clear. This has also been ongoing for weeks if not more. She does not require any further workup. She was reassured, all symptoms are musculoskeletal in nature and can be treated with anti-inflammatory medication.  I personally performed the services described in this documentation, which was scribed in my presence. The recorded information has been reviewed and is accurate.     Orpah Greek, MD 08/09/15 [id number]

## 2015-10-10 ENCOUNTER — Emergency Department (HOSPITAL_COMMUNITY)
Admission: EM | Admit: 2015-10-10 | Discharge: 2015-10-10 | Disposition: A | Discharge status: Home / Self Care | Payer: Medicaid Other | Attending: Emergency Medicine | Admitting: Emergency Medicine

## 2015-10-10 ENCOUNTER — Encounter (HOSPITAL_COMMUNITY): Payer: Self-pay | Admitting: Cardiology

## 2015-10-10 ENCOUNTER — Emergency Department (HOSPITAL_COMMUNITY): Payer: Medicaid Other

## 2015-10-10 DIAGNOSIS — N831 Corpus luteum cyst of ovary, unspecified side: Secondary | ICD-10-CM

## 2015-10-10 DIAGNOSIS — R102 Pelvic and perineal pain: Secondary | ICD-10-CM

## 2015-10-10 DIAGNOSIS — Z79899 Other long term (current) drug therapy: Secondary | ICD-10-CM | POA: Insufficient documentation

## 2015-10-10 DIAGNOSIS — Z86018 Personal history of other benign neoplasm: Secondary | ICD-10-CM | POA: Insufficient documentation

## 2015-10-10 DIAGNOSIS — N760 Acute vaginitis: Secondary | ICD-10-CM | POA: Insufficient documentation

## 2015-10-10 DIAGNOSIS — B9689 Other specified bacterial agents as the cause of diseases classified elsewhere: Secondary | ICD-10-CM

## 2015-10-10 DIAGNOSIS — F17210 Nicotine dependence, cigarettes, uncomplicated: Secondary | ICD-10-CM | POA: Insufficient documentation

## 2015-10-10 DIAGNOSIS — Z8742 Personal history of other diseases of the female genital tract: Secondary | ICD-10-CM | POA: Insufficient documentation

## 2015-10-10 DIAGNOSIS — Z791 Long term (current) use of non-steroidal anti-inflammatories (NSAID): Secondary | ICD-10-CM | POA: Insufficient documentation

## 2015-10-10 DIAGNOSIS — Z3202 Encounter for pregnancy test, result negative: Secondary | ICD-10-CM | POA: Insufficient documentation

## 2015-10-10 DIAGNOSIS — I10 Essential (primary) hypertension: Secondary | ICD-10-CM | POA: Insufficient documentation

## 2015-10-10 DIAGNOSIS — E119 Type 2 diabetes mellitus without complications: Secondary | ICD-10-CM | POA: Insufficient documentation

## 2015-10-10 LAB — CBC
HEMATOCRIT: 38 % (ref 36.0–46.0)
HEMOGLOBIN: 12.4 g/dL (ref 12.0–15.0)
MCH: 27.4 pg (ref 26.0–34.0)
MCHC: 32.6 g/dL (ref 30.0–36.0)
MCV: 84.1 fL (ref 78.0–100.0)
Platelets: 416 10*3/uL — ABNORMAL HIGH (ref 150–400)
RBC: 4.52 MIL/uL (ref 3.87–5.11)
RDW: 13.4 % (ref 11.5–15.5)
WBC: 9.6 10*3/uL (ref 4.0–10.5)

## 2015-10-10 LAB — URINALYSIS, ROUTINE W REFLEX MICROSCOPIC
BILIRUBIN URINE: NEGATIVE
GLUCOSE, UA: NEGATIVE mg/dL
HGB URINE DIPSTICK: NEGATIVE
Ketones, ur: NEGATIVE mg/dL
Leukocytes, UA: NEGATIVE
Nitrite: NEGATIVE
Protein, ur: NEGATIVE mg/dL
SPECIFIC GRAVITY, URINE: 1.025 (ref 1.005–1.030)
pH: 6 (ref 5.0–8.0)

## 2015-10-10 LAB — COMPREHENSIVE METABOLIC PANEL
ALBUMIN: 3.5 g/dL (ref 3.5–5.0)
ALT: 22 U/L (ref 14–54)
ANION GAP: 7 (ref 5–15)
AST: 13 U/L — ABNORMAL LOW (ref 15–41)
Alkaline Phosphatase: 110 U/L (ref 38–126)
BUN: 13 mg/dL (ref 6–20)
CO2: 24 mmol/L (ref 22–32)
Calcium: 8.8 mg/dL — ABNORMAL LOW (ref 8.9–10.3)
Chloride: 108 mmol/L (ref 101–111)
Creatinine, Ser: 0.52 mg/dL (ref 0.44–1.00)
GFR calc Af Amer: >60 mL/min (ref >60)
GFR calc non Af Amer: >60 mL/min (ref >60)
GLUCOSE: 136 mg/dL — AB (ref 65–99)
Potassium: 3.9 mmol/L (ref 3.5–5.1)
SODIUM: 139 mmol/L (ref 135–145)
Total Bilirubin: 0.2 mg/dL — ABNORMAL LOW (ref 0.3–1.2)
Total Protein: 7.1 g/dL (ref 6.5–8.1)

## 2015-10-10 LAB — WET PREP, GENITAL
SPERM: NONE SEEN
Trich, Wet Prep: NONE SEEN
Yeast Wet Prep HPF POC: NONE SEEN

## 2015-10-10 LAB — LIPASE, BLOOD: Lipase: 30 U/L (ref 11–51)

## 2015-10-10 LAB — PREGNANCY, URINE: Preg Test, Ur: NEGATIVE

## 2015-10-10 MED ORDER — METRONIDAZOLE 500 MG PO TABS: 500.0000 mg | ORAL_TABLET | Freq: Two times a day (BID) | ORAL | Status: DC

## 2015-10-10 MED ORDER — IBUPROFEN 600 MG PO TABS: 600.0000 mg | ORAL_TABLET | Freq: Four times a day (QID) | ORAL | Status: DC | PRN

## 2015-10-10 MED ORDER — KETOROLAC TROMETHAMINE 60 MG/2ML IM SOLN
60.0000 mg | Freq: Once | INTRAMUSCULAR | Status: AC
  Administered 2015-10-10: 60 mg via INTRAMUSCULAR
  Filled 2015-10-10: qty 2

## 2015-10-10 NOTE — ED Notes (Signed)
Abdominal pain since last night.

## 2015-10-10 NOTE — ED Provider Notes (Signed)
CSN: [id number]     Arrival date & time 10/10/15  1223 History   First MD Initiated Contact with Patient 10/10/15 1524     Chief Complaint  Patient presents with  . Abdominal Pain     (Consider location/radiation/quality/duration/timing/severity/associated sxs/prior Treatment) The history is provided by the patient.    Mallory GOVEA is a 29 y.o. female with a history of htn and DM type II presenting with left lower and suprapubic abdominal pain which started last night.  Her pain is intermittent and cramping in character.  She endorses similar pain with a left ovarian cyst she was seen here for 6/16.  She denies fevers or chills, nausea, vomiting, flank pain, dysuria, hematuria or vaginal discharge.  She is in a monogamous relationship.  She is not currently using birth control.  Her LMP was 11/24 and normal.  She has found no alleviators for her pain which is worsened with certain positions and movement.  Of note, her [place] completed 6/16 revealed a left ovarian hemorrhagic cyst measuring 5 x 3 x 5 cm  and a uterine fibroid with recommended f/u in 6-8 weeks for repeat US.  Pt has not followed this up.     Past Medical History  Diagnosis Date  . Hypertension   . Diabetes mellitus without complication ([place])    History reviewed. No pertinent past surgical history. Family History  Problem Relation Age of Onset  . Asthma Mother   . Diabetes Other   . Heart failure Other   . Cancer Other    Social History  Substance Use Topics  . Smoking status: Current Every Day Smoker -- 0.50 packs/day    Types: Cigarettes  . Smokeless tobacco: Never Used  . Alcohol Use: Yes     Comment: occas   OB History    No data available     Review of Systems  Constitutional: Negative for fever.  HENT: Negative for congestion and sore throat.   Eyes: Negative.   Respiratory: Negative for chest tightness and shortness of breath.   Cardiovascular: Negative for chest pain.  Gastrointestinal:  Negative for nausea, vomiting, abdominal pain and diarrhea.  Genitourinary: Positive for pelvic pain. Negative for dysuria, hematuria, decreased urine volume, vaginal bleeding, vaginal discharge and menstrual problem.  Musculoskeletal: Negative for joint swelling, arthralgias and neck pain.  Skin: Negative.  Negative for rash and wound.  Neurological: Negative for dizziness, weakness, light-headedness, numbness and headaches.  Psychiatric/Behavioral: Negative.       Allergies  Review of patient's allergies indicates no known allergies.  Home Medications   Prior to Admission medications   Medication Sig Start Date End Date Taking? Authorizing Provider  atenolol (TENORMIN) 25 MG tablet Take by mouth daily.    Historical Provider, MD  cyclobenzaprine (FLEXERIL) 10 MG tablet Take 1 tablet (10 mg total) by mouth 3 (three) times daily as needed for muscle spasms. 08/09/15   Orpah Greek, MD  ibuprofen (ADVIL,MOTRIN) 800 MG tablet Take 1 tablet (800 mg total) by mouth 3 (three) times daily. 08/09/15   Orpah Greek, MD  lisinopril (PRINIVIL,ZESTRIL) 5 MG tablet Take 5 mg by mouth daily.    Historical Provider, MD  metFORMIN (GLUCOPHAGE) 500 MG tablet Take by mouth 2 (two) times daily with a meal.    Historical Provider, MD   BP 147/100 mmHg  Pulse 86  Temp(Src) 98 F (36.7 C) (Oral)  Resp 18  Ht 5\' 4"  (1.626 m)  Wt 90.719 kg  BMI 34.31 kg/m2  SpO2 98%  LMP 10/03/2015 Physical Exam  Constitutional: She appears well-developed and well-nourished.  HENT:  Head: Normocephalic and atraumatic.  Eyes: Conjunctivae are normal.  Neck: Normal range of motion.  Cardiovascular: Normal rate, regular rhythm, normal heart sounds and intact distal pulses.   Pulmonary/Chest: Effort normal and breath sounds normal. She has no wheezes.  Abdominal: Soft. Bowel sounds are normal. There is no tenderness.  Genitourinary: Uterus is tender. Cervix exhibits no motion tenderness and no  discharge. Left adnexum displays tenderness and fullness. No vaginal discharge found.  Musculoskeletal: Normal range of motion.  Neurological: She is alert.  Skin: Skin is warm and dry.  Psychiatric: She has a normal mood and affect.  Nursing note and vitals reviewed.   ED Course  Procedures (including critical care time) Labs Review Labs Reviewed  WET PREP, GENITAL - Abnormal; Notable for the following:    Clue Cells Wet Prep HPF POC PRESENT (*)    WBC, Wet Prep HPF POC MODERATE (*)    All other components within normal limits  COMPREHENSIVE METABOLIC PANEL - Abnormal; Notable for the following:    Glucose, Bld 136 (*)    Calcium 8.8 (*)    AST 13 (*)    Total Bilirubin 0.2 (*)    All other components within normal limits  CBC - Abnormal; Notable for the following:    Platelets 416 (*)    All other components within normal limits  LIPASE, BLOOD  URINALYSIS, ROUTINE W REFLEX MICROSCOPIC (NOT AT [facility])  PREGNANCY, URINE  RPR  HIV ANTIBODY (ROUTINE TESTING)  GC/CHLAMYDIA PROBE AMP ([place]) NOT AT [facility]    Imaging Review No results found. I have personally reviewed and evaluated these images and lab results as part of my medical decision-making.   EKG Interpretation None      MDM   Final diagnoses:  Pelvic pain in female    Patients  labs reviewed.   Results were also discussed with patient. GC/chlamydia cx pending.  [place] ordered to better define pain/ fullness felt at left adnexa.  Suspect will need gyn f/u - pt referred to Dr Glo Herring for f/u care.  Labs suggesting bv, this was treated today with flagyl.   Pending [place], pt currently in [place] lab.  Discussed with Kem Parkinson PA who will dispo patient once [place] results.     Evalee Jefferson, PA-C 10/10/15 1746  Fredia Sorrow, MD 10/10/15 2317

## 2015-10-10 NOTE — Discharge Instructions (Signed)
Bacterial Vaginosis  Bacterial vaginosis is a vaginal infection that occurs when the normal balance of bacteria in the vagina is disrupted. It results from an overgrowth of certain bacteria. This is the most common vaginal infection in women of childbearing age. Treatment is important to prevent complications, especially in pregnant women, as it can cause a premature delivery.  CAUSES   Bacterial vaginosis is caused by an increase in harmful bacteria that are normally present in smaller amounts in the vagina. Several different kinds of bacteria can cause bacterial vaginosis. However, the reason that the condition develops is not fully understood.  RISK FACTORS  Certain activities or behaviors can put you at an increased risk of developing bacterial vaginosis, including:  · Having a new sex partner or multiple sex partners.  · Douching.  · Using an intrauterine device (IUD) for contraception.  Women do not get bacterial vaginosis from toilet seats, bedding, swimming pools, or contact with objects around them.  SIGNS AND SYMPTOMS   Some women with bacterial vaginosis have no signs or symptoms. Common symptoms include:  · Grey vaginal discharge.  · A fishlike odor with discharge, especially after sexual intercourse.  · Itching or burning of the vagina and vulva.  · Burning or pain with urination.  DIAGNOSIS   Your health care provider will take a medical history and examine the vagina for signs of bacterial vaginosis. A sample of vaginal fluid may be taken. Your health care provider will look at this sample under a microscope to check for bacteria and abnormal cells. A vaginal pH test may also be done.   TREATMENT   Bacterial vaginosis may be treated with antibiotic medicines. These may be given in the form of a pill or a vaginal cream. A second round of antibiotics may be prescribed if the condition comes back after treatment. Because bacterial vaginosis increases your risk for sexually transmitted diseases, getting  treated can help reduce your risk for chlamydia, gonorrhea, HIV, and herpes.  HOME CARE INSTRUCTIONS   · Only take over-the-counter or prescription medicines as directed by your health care provider.  · If antibiotic medicine was prescribed, take it as directed. Make sure you finish it even if you start to feel better.  · Tell all sexual partners that you have a vaginal infection. They should see their health care provider and be treated if they have problems, such as a mild rash or itching.  · During treatment, it is important that you follow these instructions:  ¨ Avoid sexual activity or use condoms correctly.  ¨ Do not douche.  ¨ Avoid alcohol as directed by your health care provider.  ¨ Avoid breastfeeding as directed by your health care provider.  SEEK MEDICAL CARE IF:   · Your symptoms are not improving after 3 days of treatment.  · You have increased discharge or pain.  · You have a fever.  MAKE SURE YOU:   · Understand these instructions.  · Will watch your condition.  · Will get help right away if you are not doing well or get worse.  FOR MORE INFORMATION   Centers for Disease Control and Prevention, Division of STD Prevention: www.cdc.gov/std  American Sexual Health Association (ASHA): www.ashastd.org      This information is not intended to replace advice given to you by your health care provider. Make sure you discuss any questions you have with your health care provider.     Document Released: 10/26/2005 Document Revised: 11/16/2014 Document Reviewed: 06/07/2013    Elsevier Interactive Patient Education ©2016 Elsevier Inc.  Bacterial Vaginosis  Bacterial vaginosis is a vaginal infection that occurs when the normal balance of bacteria in the vagina is disrupted. It results from an overgrowth of certain bacteria. This is the most common vaginal infection in women of childbearing age. Treatment is important to prevent complications, especially in pregnant women, as it can cause a premature delivery.  CAUSES    Bacterial vaginosis is caused by an increase in harmful bacteria that are normally present in smaller amounts in the vagina. Several different kinds of bacteria can cause bacterial vaginosis. However, the reason that the condition develops is not fully understood.  RISK FACTORS  Certain activities or behaviors can put you at an increased risk of developing bacterial vaginosis, including:  · Having a new sex partner or multiple sex partners.  · Douching.  · Using an intrauterine device (IUD) for contraception.  Women do not get bacterial vaginosis from toilet seats, bedding, swimming pools, or contact with objects around them.  SIGNS AND SYMPTOMS   Some women with bacterial vaginosis have no signs or symptoms. Common symptoms include:  · Grey vaginal discharge.  · A fishlike odor with discharge, especially after sexual intercourse.  · Itching or burning of the vagina and vulva.  · Burning or pain with urination.  DIAGNOSIS   Your health care provider will take a medical history and examine the vagina for signs of bacterial vaginosis. A sample of vaginal fluid may be taken. Your health care provider will look at this sample under a microscope to check for bacteria and abnormal cells. A vaginal pH test may also be done.   TREATMENT   Bacterial vaginosis may be treated with antibiotic medicines. These may be given in the form of a pill or a vaginal cream. A second round of antibiotics may be prescribed if the condition comes back after treatment. Because bacterial vaginosis increases your risk for sexually transmitted diseases, getting treated can help reduce your risk for chlamydia, gonorrhea, HIV, and herpes.  HOME CARE INSTRUCTIONS   · Only take over-the-counter or prescription medicines as directed by your health care provider.  · If antibiotic medicine was prescribed, take it as directed. Make sure you finish it even if you start to feel better.  · Tell all sexual partners that you have a vaginal infection. They  should see their health care provider and be treated if they have problems, such as a mild rash or itching.  · During treatment, it is important that you follow these instructions:  ¨ Avoid sexual activity or use condoms correctly.  ¨ Do not douche.  ¨ Avoid alcohol as directed by your health care provider.  ¨ Avoid breastfeeding as directed by your health care provider.  SEEK MEDICAL CARE IF:   · Your symptoms are not improving after 3 days of treatment.  · You have increased discharge or pain.  · You have a fever.  MAKE SURE YOU:   · Understand these instructions.  · Will watch your condition.  · Will get help right away if you are not doing well or get worse.  FOR MORE INFORMATION   Centers for Disease Control and Prevention, Division of STD Prevention: www.cdc.gov/std  American Sexual Health Association (ASHA): www.ashastd.org      This information is not intended to replace advice given to you by your health care provider. Make sure you discuss any questions you have with your health care provider.     Document

## 2015-10-10 NOTE — ED Notes (Signed)
PA at bedside.

## 2015-10-10 NOTE — ED Provider Notes (Signed)
  Pt singed out ot me by Evalee Jefferson PA-C at end of shift.    [place] results reviewed and discussed with patient.  She agrees to arrange close f/u with OB/GYN regarding fibroid and for possible repeat US to confirm resolution of corpus luteum cyst after 2 menstrual cycles.  Pt agrees to plan and stable for d/c  Kem Parkinson, PA-C 10/10/15 1855  Fredia Sorrow, MD 10/10/15 2317

## 2015-10-10 NOTE — ED Notes (Signed)
Pt states understanding of care given and follow up instructions.  Ambulated from ed 

## 2015-10-11 LAB — RPR: RPR: NONREACTIVE

## 2015-10-11 LAB — HIV ANTIBODY (ROUTINE TESTING W REFLEX): HIV Screen 4th Generation wRfx: NONREACTIVE

## 2015-10-11 LAB — GC/CHLAMYDIA PROBE AMP (~~LOC~~) NOT AT ARMC
CHLAMYDIA, DNA PROBE: NEGATIVE
Neisseria Gonorrhea: NEGATIVE

## 2016-01-25 ENCOUNTER — Encounter (HOSPITAL_COMMUNITY): Payer: Self-pay | Admitting: Emergency Medicine

## 2016-01-25 ENCOUNTER — Emergency Department (HOSPITAL_COMMUNITY)
Admission: EM | Admit: 2016-01-25 | Discharge: 2016-01-25 | Disposition: A | Discharge status: Home / Self Care | Payer: Medicaid Other | Attending: Emergency Medicine | Admitting: Emergency Medicine

## 2016-01-25 DIAGNOSIS — E119 Type 2 diabetes mellitus without complications: Secondary | ICD-10-CM | POA: Insufficient documentation

## 2016-01-25 DIAGNOSIS — I10 Essential (primary) hypertension: Secondary | ICD-10-CM | POA: Insufficient documentation

## 2016-01-25 DIAGNOSIS — F17210 Nicotine dependence, cigarettes, uncomplicated: Secondary | ICD-10-CM | POA: Insufficient documentation

## 2016-01-25 DIAGNOSIS — Z7984 Long term (current) use of oral hypoglycemic drugs: Secondary | ICD-10-CM | POA: Insufficient documentation

## 2016-01-25 DIAGNOSIS — R112 Nausea with vomiting, unspecified: Secondary | ICD-10-CM | POA: Insufficient documentation

## 2016-01-25 DIAGNOSIS — R197 Diarrhea, unspecified: Secondary | ICD-10-CM | POA: Insufficient documentation

## 2016-01-25 LAB — BASIC METABOLIC PANEL
Anion gap: 8 (ref 5–15)
BUN: 13 mg/dL (ref 6–20)
CALCIUM: 8.9 mg/dL (ref 8.9–10.3)
CO2: 22 mmol/L (ref 22–32)
CREATININE: 0.56 mg/dL (ref 0.44–1.00)
Chloride: 106 mmol/L (ref 101–111)
GFR calc Af Amer: >60 mL/min (ref >60)
GFR calc non Af Amer: >60 mL/min (ref >60)
GLUCOSE: 207 mg/dL — AB (ref 65–99)
Potassium: 3.8 mmol/L (ref 3.5–5.1)
Sodium: 136 mmol/L (ref 135–145)

## 2016-01-25 LAB — URINALYSIS, ROUTINE W REFLEX MICROSCOPIC
Bilirubin Urine: NEGATIVE
GLUCOSE, UA: NEGATIVE mg/dL
Hgb urine dipstick: NEGATIVE
KETONES UR: NEGATIVE mg/dL
LEUKOCYTES UA: NEGATIVE
Nitrite: NEGATIVE
PH: 5.5 (ref 5.0–8.0)
Protein, ur: NEGATIVE mg/dL
Specific Gravity, Urine: 1.025 (ref 1.005–1.030)

## 2016-01-25 LAB — CBC
HCT: 41.1 % (ref 36.0–46.0)
HEMOGLOBIN: 13.7 g/dL (ref 12.0–15.0)
MCH: 27.6 pg (ref 26.0–34.0)
MCHC: 33.3 g/dL (ref 30.0–36.0)
MCV: 82.7 fL (ref 78.0–100.0)
PLATELETS: 458 10*3/uL — AB (ref 150–400)
RBC: 4.97 MIL/uL (ref 3.87–5.11)
RDW: 13.5 % (ref 11.5–15.5)
WBC: 10.4 10*3/uL (ref 4.0–10.5)

## 2016-01-25 LAB — CBG MONITORING, ED: Glucose-Capillary: 196 mg/dL — ABNORMAL HIGH (ref 65–99)

## 2016-01-25 LAB — I-STAT BETA HCG BLOOD, ED (MC, WL, AP ONLY)

## 2016-01-25 MED ORDER — SODIUM CHLORIDE 0.9 % IV BOLUS (SEPSIS)
1000.0000 mL | Freq: Once | INTRAVENOUS | Status: AC
  Administered 2016-01-25: 1000 mL via INTRAVENOUS

## 2016-01-25 MED ORDER — SODIUM CHLORIDE 0.9 % IV BOLUS (SEPSIS): 1000.0000 mL | Freq: Once | INTRAVENOUS | Status: DC

## 2016-01-25 MED ORDER — ONDANSETRON HCL 4 MG/2ML IJ SOLN
4.0000 mg | Freq: Once | INTRAMUSCULAR | Status: AC
  Administered 2016-01-25: 4 mg via INTRAVENOUS
  Filled 2016-01-25: qty 2

## 2016-01-25 MED ORDER — ONDANSETRON 4 MG PO TBDP: 4.0000 mg | ORAL_TABLET | Freq: Three times a day (TID) | ORAL | Status: DC | PRN

## 2016-01-25 MED ORDER — ACETAMINOPHEN 325 MG PO TABS
650.0000 mg | ORAL_TABLET | Freq: Once | ORAL | Status: AC
  Administered 2016-01-25: 650 mg via ORAL
  Filled 2016-01-25: qty 2

## 2016-01-25 NOTE — Discharge Instructions (Signed)
Please obtain all of your results from medical records or have your doctors office obtain the results - share them with your doctor - you should be seen at your doctors office in the next 2 days. Call today to arrange your follow up. Take the medications as prescribed. Please review all of the medicines and only take them if you do not have an allergy to them. Please be aware that if you are taking birth control pills, taking other prescriptions, ESPECIALLY ANTIBIOTICS may make the birth control ineffective - if this is the case, either do not engage in sexual activity or use alternative methods of birth control such as condoms until you have finished the medicine and your family doctor says it is OK to restart them. If you are on a blood thinner such as COUMADIN, be aware that any other medicine that you take may cause the coumadin to either work too much, or not enough - you should have your coumadin level rechecked in next 7 days if this is the case.  ?  It is also a possibility that you have an allergic reaction to any of the medicines that you have been prescribed - Everybody reacts differently to medications and while MOST people have no trouble with most medicines, you may have a reaction such as nausea, vomiting, rash, swelling, shortness of breath. If this is the case, please stop taking the medicine immediately and contact your physician.  ?  You should return to the ER if you develop severe or worsening symptoms.    Zofran for nausea  [facility] Doctor List    Sinda Du MD. Specialty: Pulmonary Disease Contact information: [place]  [place] [address]  [phone]   Tula Nakayama, MD. Specialty: [facility] Medicine Contact information: [address], [address] [place] [address]  [phone]   Sallee Lange, MD. Specialty: [facility] Medicine Contact information: [place]  [place] [address]  [phone]   Rosita Fire, MD Specialty: Internal Medicine Contact information: [place] [address]  [phone]   Delphina Cahill, MD. Specialty: Internal Medicine Contact information: [place] [address]  [phone]   Marjean Donna, MD. Specialty: Family Medicine Contact information: [place] [address]  [phone]   Leslie Andrea, MD. Specialty: [facility] Medicine Contact information: [place] [address]  [phone]   Asencion Noble, MD. Specialty: Internal Medicine Contact information: [place]  [place] [address]  [phone]

## 2016-01-25 NOTE — ED Notes (Signed)
Pt states she is having stomach cramps, requests pain medication.

## 2016-01-25 NOTE — ED Notes (Signed)
CBG 196 

## 2016-01-25 NOTE — ED Notes (Signed)
PT c/o n/v/d x10 times since 1900 last night and states sister with same symptoms. Pt also states she hasn't taken her Metformin in about 3 months.

## 2016-01-25 NOTE — ED Notes (Signed)
Pt requested to let us know when she can give a urine specimen.

## 2016-01-25 NOTE — ED Notes (Signed)
Pt given water per request

## 2016-01-25 NOTE — ED Provider Notes (Signed)
CSN: [id number]     Arrival date & time 01/25/16  0720 History   First MD Initiated Contact with Patient 01/25/16 [phone]     Chief Complaint  Patient presents with  . Emesis     (Consider location/radiation/quality/duration/timing/severity/associated sxs/prior Treatment) HPI Comments: The pt is a 30 y/o femal - hx of DM - she states that she has had n/v/d overnight - has had a sick sister with the same sx that has been over in last couple of days - pt has no abd pain and no f/c.  She has been drinking Coke to try to calm her stomach overnight - she has mild sx yesterday but got worse last night and when she tried to eat wings she threw it all up.  She denies any other sx.  Sx were gradual in onset yesterday, persistent, nothing makes better or worse.  No assocaited fevers, chills, headache, sore throat, visual changes, neck pain, back pain, chest pain, abdominal pain, shortness of breath, cough, dysuria, rectal bleeding, swelling, rashes, numbness or weakness.  She also reports that she has had no metformin in the last few months stating that her blood sugar is around 96 when she is checking it so she didn't want it to go too low.   Patient is a 30 y.o. female presenting with vomiting. The history is provided by the patient.  Emesis   Past Medical History  Diagnosis Date  . Hypertension   . Diabetes mellitus without complication ([place])    History reviewed. No pertinent past surgical history. Family History  Problem Relation Age of Onset  . Asthma Mother   . Diabetes Other   . Heart failure Other   . Cancer Other    Social History  Substance Use Topics  . Smoking status: Current Every Day Smoker -- 0.50 packs/day    Types: Cigarettes  . Smokeless tobacco: Never Used  . Alcohol Use: Yes     Comment: occas   OB History    Gravida Para Term Preterm AB TAB SAB Ectopic Multiple Living   0 0 0 0 0 0 0 0 0 0      Review of Systems  Gastrointestinal: Positive for vomiting.  All  other systems reviewed and are negative.     Allergies  Review of patient's allergies indicates no known allergies.  Home Medications   Prior to Admission medications   Medication Sig Start Date End Date Taking? Authorizing Provider  ibuprofen (ADVIL,MOTRIN) 600 MG tablet Take 1 tablet (600 mg total) by mouth every 6 (six) hours as needed. 10/10/15   Evalee Jefferson, PA-C  metFORMIN (GLUCOPHAGE) 500 MG tablet Take 500 mg by mouth 2 (two) times daily with a meal.     Historical Provider, MD  metroNIDAZOLE (FLAGYL) 500 MG tablet Take 1 tablet (500 mg total) by mouth 2 (two) times daily. 10/10/15   Evalee Jefferson, PA-C  ondansetron (ZOFRAN ODT) 4 MG disintegrating tablet Take 1 tablet (4 mg total) by mouth every 8 (eight) hours as needed for nausea. 01/25/16   Noemi Chapel, MD   BP 129/93 mmHg  Pulse 107  Temp(Src) 97.7 F (36.5 C) (Oral)  Resp 16  Ht 5\' 6"  (1.676 m)  Wt 185 lb (83.915 kg)  BMI 29.87 kg/m2  SpO2 97%  LMP 01/16/2016 Physical Exam  Constitutional:  Overall the patient is well-appearing in no acute distress  HENT:  Clear oropharynx, normal appearing mucous membranes  Eyes:  Clear conjunctiva, reactive pupils  Neck:  Supple nontender  neck with no lymphadenopathy  Cardiovascular:  Tachycardia to 105 bpm, strong pulses at the radial arteries, no JVD  Pulmonary/Chest:  Clear lung sounds, no distress, speaks in full sentences  Abdominal:  Soft nontender abdomen with no distention or tenderness  Musculoskeletal:  No peripheral edema, supple joints  Neurological:  Gait speech and coordination are all normal  Skin:  No rashes on exposed skin areas    ED Course  Procedures (including critical care time) Labs Review Labs Reviewed  BASIC METABOLIC PANEL - Abnormal; Notable for the following:    Glucose, Bld 207 (*)    All other components within normal limits  CBC - Abnormal; Notable for the following:    Platelets 458 (*)    All other components within normal limits   URINALYSIS, ROUTINE W REFLEX MICROSCOPIC (NOT AT [facility]) - Abnormal; Notable for the following:    APPearance HAZY (*)    All other components within normal limits  CBG MONITORING, ED - Abnormal; Notable for the following:    Glucose-Capillary 196 (*)    All other components within normal limits  CBG MONITORING, ED  I-STAT BETA HCG BLOOD, ED ([facility], [facility], [facility] ONLY)    Imaging Review No results found. I have personally reviewed and evaluated these images and lab results as part of my medical decision-making.    MDM   Final diagnoses:  Nausea vomiting and diarrhea    The patient has a mild tachycardia, she has been vomiting and having diarrhea all night, she reports that this is nonbloody, at this point it appears that the patient likely has a gastrointestinal illness similar to what her sister has. she will receive medications including Zofran and IV fluids, check blood sugar, metabolic panel for renal function. The patient is in agreement with the plan.  Labs with mild hyperglycemia Pt to f/u for outpt fasting glucose No UTI Labs otherwise unremarkable Pulse in the low 100's - has had fluid and antiemetics Pt understands indiaction for return.  Filed Vitals:   01/25/16 0731 01/25/16 0800 01/25/16 0900 01/25/16 1000  BP: 119/85 138/89 134/91 129/93  Pulse: 103 113 114 107  Temp: 97.7 F (36.5 C)     TempSrc: Oral     Resp: 18  16 16   Height: 5\' 6"  (1.676 m)     Weight: 185 lb (83.915 kg)     SpO2: 98% 99% 100% 97%     Meds given in ED:  Medications  sodium chloride 0.9 % bolus 1,000 mL (0 mLs Intravenous Stopped 01/25/16 0958)  ondansetron (ZOFRAN) injection 4 mg (4 mg Intravenous Given 01/25/16 0743)  acetaminophen (TYLENOL) tablet 650 mg (650 mg Oral Given 01/25/16 1002)    New Prescriptions   ONDANSETRON (ZOFRAN ODT) 4 MG DISINTEGRATING TABLET    Take 1 tablet (4 mg total) by mouth every 8 (eight) hours as needed for nausea.           Noemi Chapel,  MD 01/25/16 1013

## 2016-03-05 DIAGNOSIS — F17210 Nicotine dependence, cigarettes, uncomplicated: Secondary | ICD-10-CM | POA: Insufficient documentation

## 2016-03-05 DIAGNOSIS — I10 Essential (primary) hypertension: Secondary | ICD-10-CM | POA: Insufficient documentation

## 2016-03-05 DIAGNOSIS — R079 Chest pain, unspecified: Secondary | ICD-10-CM | POA: Insufficient documentation

## 2016-03-06 ENCOUNTER — Emergency Department (HOSPITAL_COMMUNITY): Payer: Medicaid Other

## 2016-03-06 ENCOUNTER — Encounter (HOSPITAL_COMMUNITY): Payer: Self-pay | Admitting: Emergency Medicine

## 2016-03-06 ENCOUNTER — Emergency Department (HOSPITAL_COMMUNITY)
Admission: EM | Admit: 2016-03-06 | Discharge: 2016-03-06 | Disposition: A | Discharge status: Home / Self Care | Payer: Medicaid Other | Attending: Emergency Medicine | Admitting: Emergency Medicine

## 2016-03-06 DIAGNOSIS — R079 Chest pain, unspecified: Secondary | ICD-10-CM

## 2016-03-06 DIAGNOSIS — I10 Essential (primary) hypertension: Secondary | ICD-10-CM

## 2016-03-06 LAB — CBG MONITORING, ED: Glucose-Capillary: 140 mg/dL — ABNORMAL HIGH (ref 65–99)

## 2016-03-06 LAB — URINALYSIS, ROUTINE W REFLEX MICROSCOPIC
Bilirubin Urine: NEGATIVE
GLUCOSE, UA: NEGATIVE mg/dL
Hgb urine dipstick: NEGATIVE
Ketones, ur: NEGATIVE mg/dL
LEUKOCYTES UA: NEGATIVE
NITRITE: NEGATIVE
PH: 6 (ref 5.0–8.0)
Protein, ur: NEGATIVE mg/dL
SPECIFIC GRAVITY, URINE: 1.01 (ref 1.005–1.030)

## 2016-03-06 LAB — POC URINE PREG, ED: Preg Test, Ur: NEGATIVE

## 2016-03-06 MED ORDER — ONDANSETRON 8 MG PO TBDP
8.0000 mg | ORAL_TABLET | Freq: Once | ORAL | Status: AC
  Administered 2016-03-06: 8 mg via ORAL
  Filled 2016-03-06: qty 1

## 2016-03-06 MED ORDER — IBUPROFEN 800 MG PO TABS
800.0000 mg | ORAL_TABLET | Freq: Once | ORAL | Status: AC
Start: 2016-03-06 — End: 2016-03-06
  Administered 2016-03-06: 800 mg via ORAL
  Filled 2016-03-06: qty 1

## 2016-03-06 NOTE — ED Provider Notes (Signed)
CSN: [id number]     Arrival date & time 03/05/16  2359 History   First MD Initiated Contact with Patient 03/06/16 0150     Chief Complaint  Patient presents with  . Abdominal Pain  . Emesis     Patient is a 30 y.o. female presenting with chest pain. The history is provided by the patient.  Chest Pain Pain location:  L chest Pain quality comment:  "bubbling" Pain radiates to:  Does not radiate Pain radiates to the back: no   Pain severity:  Moderate Onset quality:  Gradual Duration:  1 week Timing:  Constant Progression:  Unchanged Chronicity:  New Relieved by:  Nothing Worsened by:  Nothing tried Associated symptoms: shortness of breath   Associated symptoms: no abdominal pain, no fever, no syncope and not vomiting   Risk factors: diabetes mellitus, hypertension and smoking   Risk factors: no birth control, no coronary artery disease and no prior DVT/PE   pt reports onset of left sided chest "bubbling" over past week She also reports mild SOB No fever/vomiting No abd pain She has never had this before No recent travel/surgery No h/o CAD/PE/DVT   Past Medical History  Diagnosis Date  . Hypertension   . Diabetes mellitus without complication ([place])    History reviewed. No pertinent past surgical history. Family History  Problem Relation Age of Onset  . Asthma Mother   . Diabetes Other   . Heart failure Other   . Cancer Other    Social History  Substance Use Topics  . Smoking status: Current Every Day Smoker -- 0.50 packs/day    Types: Cigarettes  . Smokeless tobacco: Never Used  . Alcohol Use: Yes     Comment: "every day but I didn't drink anything today"   OB History    Gravida Para Term Preterm AB TAB SAB Ectopic Multiple Living   0 0 0 0 0 0 0 0 0 0      Review of Systems  Constitutional: Negative for fever.  Respiratory: Positive for shortness of breath.   Cardiovascular: Positive for chest pain. Negative for syncope.  Gastrointestinal: Negative for  vomiting and abdominal pain.  Genitourinary: Negative for vaginal bleeding and vaginal discharge.  All other systems reviewed and are negative.     Allergies  Review of patient's allergies indicates no known allergies.  Home Medications   Prior to Admission medications   Medication Sig Start Date End Date Taking? Authorizing Provider  ibuprofen (ADVIL,MOTRIN) 600 MG tablet Take 1 tablet (600 mg total) by mouth every 6 (six) hours as needed. 10/10/15   Evalee Jefferson, PA-C  metFORMIN (GLUCOPHAGE) 500 MG tablet Take 500 mg by mouth 2 (two) times daily with a meal.     Historical Provider, MD  metroNIDAZOLE (FLAGYL) 500 MG tablet Take 1 tablet (500 mg total) by mouth 2 (two) times daily. 10/10/15   Evalee Jefferson, PA-C  ondansetron (ZOFRAN ODT) 4 MG disintegrating tablet Take 1 tablet (4 mg total) by mouth every 8 (eight) hours as needed for nausea. 01/25/16   Noemi Chapel, MD   BP 143/100 mmHg  Pulse 83  Temp(Src) 98.5 F (36.9 C) (Oral)  Resp 23  Ht 5\' 5"  (1.651 m)  Wt 86.183 kg  BMI 31.62 kg/m2  SpO2 100%  LMP 02/28/2016 Physical Exam CONSTITUTIONAL: Well developed/well nourished HEAD: Normocephalic/atraumatic EYES: EOMI/PERRL ENMT: Mucous membranes moist NECK: supple no meningeal signs SPINE/BACK:entire spine nontender CV: S1/S2 noted, no murmurs/rubs/gallops noted Chest - no focal chest tenderness/bruising/crepitus LUNGS:  Lungs are clear to auscultation bilaterally, no apparent distress ABDOMEN: soft, nontender, no rebound or guarding, bowel sounds noted throughout abdomen GU:no cva tenderness NEURO: Pt is awake/alert/appropriate, moves all extremitiesx4.  No facial droop.   EXTREMITIES: pulses normal/equal, full ROM, no calf tenderness/edema. SKIN: warm, color normal PSYCH: no abnormalities of mood noted, alert and oriented to situation  ED Course  Procedures   Pt well appearing She reports all of her pain is in left chest and "bubbling" Clinically well appearing EKG  unremarkable CXR negative She appears PERC negative I doubt ACS/PE/Dissection at this time No focal abdominal tenderness Her BP improved while in the ED Stable for d/c home  Labs Review Labs Reviewed  CBG MONITORING, ED - Abnormal; Notable for the following:    Glucose-Capillary 140 (*)    All other components within normal limits  URINALYSIS, ROUTINE W REFLEX MICROSCOPIC (NOT AT [facility])  POC URINE PREG, ED    Imaging Review Dg Chest 2 View  03/06/2016  CLINICAL DATA:  Acute onset of productive cough and left sided abdominal pain. Abdominal swelling and vomiting. Initial encounter. EXAM: CHEST  2 VIEW COMPARISON:  Chest radiograph performed 03/26/2015 FINDINGS: The lungs are well-aerated and clear. There is no evidence of focal opacification, pleural effusion or pneumothorax. The heart is normal in size; the mediastinal contour is within normal limits. No acute osseous abnormalities are seen. IMPRESSION: No acute cardiopulmonary process seen. Electronically Signed   By: Garald Balding M.D.   On: 03/06/2016 02:37   I have personally reviewed and evaluated these images and lab results as part of my medical decision-making.  ED ECG REPORT   Date: 03/06/2016 0204  Rate: 88  Rhythm: normal sinus rhythm  QRS Axis: normal  Intervals: normal  ST/T Wave abnormalities: nonspecific ST changes  Conduction Disutrbances:none I have personally reviewed the EKG tracing and agree with the computerized printout as noted.   MDM   Final diagnoses:  Chest pain, unspecified chest pain type  Essential hypertension    Nursing notes including past medical history and social history reviewed and considered in documentation xrays/imaging reviewed by myself and considered during evaluation Labs/vital reviewed myself and considered during evaluation     Ripley Fraise, MD 03/06/16 [phone]

## 2016-03-06 NOTE — ED Notes (Signed)
Patient complaining of abdominal pain radiating under left breast and abdominal swelling x 1 week. Also complaining of vomiting x 2-3 days.

## 2016-03-06 NOTE — Discharge Instructions (Signed)

## 2016-03-06 NOTE — ED Notes (Signed)
CBG - 140 in triage.

## 2016-03-06 NOTE — ED Notes (Signed)
Pt reports having abd "fullness" and bloating for the past week. States she took a "water pill" today and v/ after taking. Also reports she did not have a BM for aprrox. 4 day, had a hard BM yesterday.

## 2016-04-30 ENCOUNTER — Ambulatory Visit: Payer: Medicaid Other | Admitting: Physician Assistant

## 2016-04-30 ENCOUNTER — Encounter: Payer: Self-pay | Admitting: Physician Assistant

## 2016-04-30 VITALS — BP 116/80 | Temp 97.9°F | Ht 65.0 in | Wt 200.2 lb

## 2016-04-30 DIAGNOSIS — E1165 Type 2 diabetes mellitus with hyperglycemia: Principal | ICD-10-CM

## 2016-04-30 DIAGNOSIS — Z1329 Encounter for screening for other suspected endocrine disorder: Secondary | ICD-10-CM

## 2016-04-30 DIAGNOSIS — IMO0001 Reserved for inherently not codable concepts without codable children: Secondary | ICD-10-CM

## 2016-04-30 DIAGNOSIS — F1010 Alcohol abuse, uncomplicated: Secondary | ICD-10-CM

## 2016-04-30 DIAGNOSIS — E669 Obesity, unspecified: Secondary | ICD-10-CM

## 2016-04-30 DIAGNOSIS — F17210 Nicotine dependence, cigarettes, uncomplicated: Secondary | ICD-10-CM

## 2016-04-30 DIAGNOSIS — R7989 Other specified abnormal findings of blood chemistry: Secondary | ICD-10-CM

## 2016-04-30 DIAGNOSIS — Z131 Encounter for screening for diabetes mellitus: Secondary | ICD-10-CM

## 2016-04-30 DIAGNOSIS — Z13220 Encounter for screening for lipoid disorders: Secondary | ICD-10-CM

## 2016-04-30 LAB — GLUCOSE, POCT (MANUAL RESULT ENTRY): POC GLUCOSE: 176 mg/dL — AB (ref 70–99)

## 2016-04-30 NOTE — Patient Instructions (Signed)
Get fasting labs drawn Go to diabetes education class Restart metformin 500mg  twice daily Stop drinking alcohol

## 2016-04-30 NOTE — Progress Notes (Signed)
BP 116/80 mmHg  Temp(Src) 97.9 F (36.6 C)  Ht 5\' 5"  (1.651 m)  Wt 200 lb 3.2 oz (90.81 kg)  BMI 33.31 kg/m2  SpO2 98%  LMP 04/23/2016 (Approximate)   Subjective:    Patient ID: Mallory Mcdowell, female    DOB: [birth year], 30 y.o.   MRN: [id number]  HPI: Mallory Mcdowell is a 30 y.o. female presenting on 04/30/2016 for New Patient (Initial Visit)   HPI   -pt's medicaid is family planning only per christy  -Pt has been going to [facility] for care-  She says she hasn't been there for a long time, since sometime last year.   -Pt says she hasn't been taking her metformin b/c her bs was low- 87 and 90  -Pt says dx with dm age 55 or 60  -LMP last week   Relevant past medical, surgical, family and social history reviewed and updated as indicated. Interim medical history since our last visit reviewed. Allergies and medications reviewed and updated.  CURRENT MEDS: none  Review of Systems  Constitutional: Positive for appetite change. Negative for fever, chills, diaphoresis, fatigue and unexpected weight change.  HENT: Positive for congestion and dental problem. Negative for drooling, ear pain, facial swelling, hearing loss, mouth sores, sneezing, sore throat, trouble swallowing and voice change.   Eyes: Positive for visual disturbance. Negative for pain, discharge, redness and itching.  Respiratory: Negative for cough, choking, shortness of breath and wheezing.   Cardiovascular: Negative for chest pain, palpitations and leg swelling.  Gastrointestinal: Positive for constipation. Negative for vomiting, abdominal pain, diarrhea and blood in stool.  Endocrine: Negative for cold intolerance, heat intolerance and polydipsia.  Genitourinary: Negative for dysuria, hematuria and decreased urine volume.  Musculoskeletal: Positive for back pain. Negative for arthralgias and gait problem.  Skin: Negative for rash.  Allergic/Immunologic: Negative for environmental allergies.  Neurological:  Negative for seizures, syncope, light-headedness and headaches.  Hematological: Negative for adenopathy.  Psychiatric/Behavioral: Negative for suicidal ideas, dysphoric mood and agitation. The patient is not nervous/anxious.     Per HPI unless specifically indicated above     Objective:    BP 116/80 mmHg  Temp(Src) 97.9 F (36.6 C)  Ht 5\' 5"  (1.651 m)  Wt 200 lb 3.2 oz (90.81 kg)  BMI 33.31 kg/m2  SpO2 98%  LMP 04/23/2016 (Approximate)  Wt Readings from Last 3 Encounters:  04/30/16 200 lb 3.2 oz (90.81 kg)  03/06/16 190 lb (86.183 kg)  01/25/16 185 lb (83.915 kg)    Physical Exam  Constitutional: She is oriented to person, place, and time. She appears well-developed and well-nourished.  HENT:  Head: Normocephalic and atraumatic.  Mouth/Throat: Oropharynx is clear and moist. No oropharyngeal exudate.  Eyes: Conjunctivae and EOM are normal. Pupils are equal, round, and reactive to light.  Bulging eyes B  Neck: Neck supple. No thyromegaly present.  Cardiovascular: Normal rate and regular rhythm.   Pulmonary/Chest: Effort normal and breath sounds normal.  Abdominal: Soft. Bowel sounds are normal. She exhibits no mass. There is no hepatosplenomegaly. There is no tenderness.  Musculoskeletal: She exhibits no edema.  Lymphadenopathy:    She has no cervical adenopathy.  Neurological: She is alert and oriented to person, place, and time. Gait normal.  Skin: Skin is warm and dry.  Psychiatric: She has a normal mood and affect. Her behavior is normal.  Vitals reviewed.  Diabetic foot exam done  Results for orders placed or performed in visit on 04/30/16  POCT Glucose (CBG)  Result Value Ref Range   POC Glucose 176 (A) 70 - 99 mg/dl      Assessment & Plan:   Encounter Diagnoses  Name Primary?  [name] Uncontrolled diabetes mellitus type 2 without complications, unspecified long term insulin use status ([place]) Yes  . Elevated platelet count ([place])   . Cigarette nicotine dependence  without complication   . Alcohol abuse   . Obesity, unspecified   . Screening for diabetes mellitus   . Screening for thyroid disorder   . Screening cholesterol level     -Counseled on contraception -pt to attend diabetes education class -pt to get Fasting labs drawn -counseled pt to Get back on metformin 500mg  bid -gave pt Reading information on DM -counsled pt to stop etoh in light of it being bad with her diabetes as well as her hx alcoholism -pt put on list for diabetic eye exam -F/u 1 month.  RTO sooner prn

## 2016-05-01 DIAGNOSIS — F17210 Nicotine dependence, cigarettes, uncomplicated: Secondary | ICD-10-CM | POA: Insufficient documentation

## 2016-05-01 DIAGNOSIS — R7989 Other specified abnormal findings of blood chemistry: Secondary | ICD-10-CM | POA: Insufficient documentation

## 2016-05-01 DIAGNOSIS — E1165 Type 2 diabetes mellitus with hyperglycemia: Secondary | ICD-10-CM

## 2016-05-01 DIAGNOSIS — F1010 Alcohol abuse, uncomplicated: Secondary | ICD-10-CM | POA: Insufficient documentation

## 2016-05-01 DIAGNOSIS — IMO0001 Reserved for inherently not codable concepts without codable children: Secondary | ICD-10-CM | POA: Insufficient documentation

## 2016-05-25 ENCOUNTER — Ambulatory Visit: Payer: Medicaid Other | Admitting: Physician Assistant

## 2016-05-26 ENCOUNTER — Encounter: Payer: Self-pay | Admitting: Physician Assistant

## 2016-06-03 IMAGING — DX DG CHEST 2V
2 series · 2 of 2 positions shown · non-contrast
Comparison: Chest radiograph from 01/09/2014

CLINICAL DATA: Chronic left-sided chest pain.  Initial encounter.

EXAM:
CHEST  2 VIEW

[chest pa]
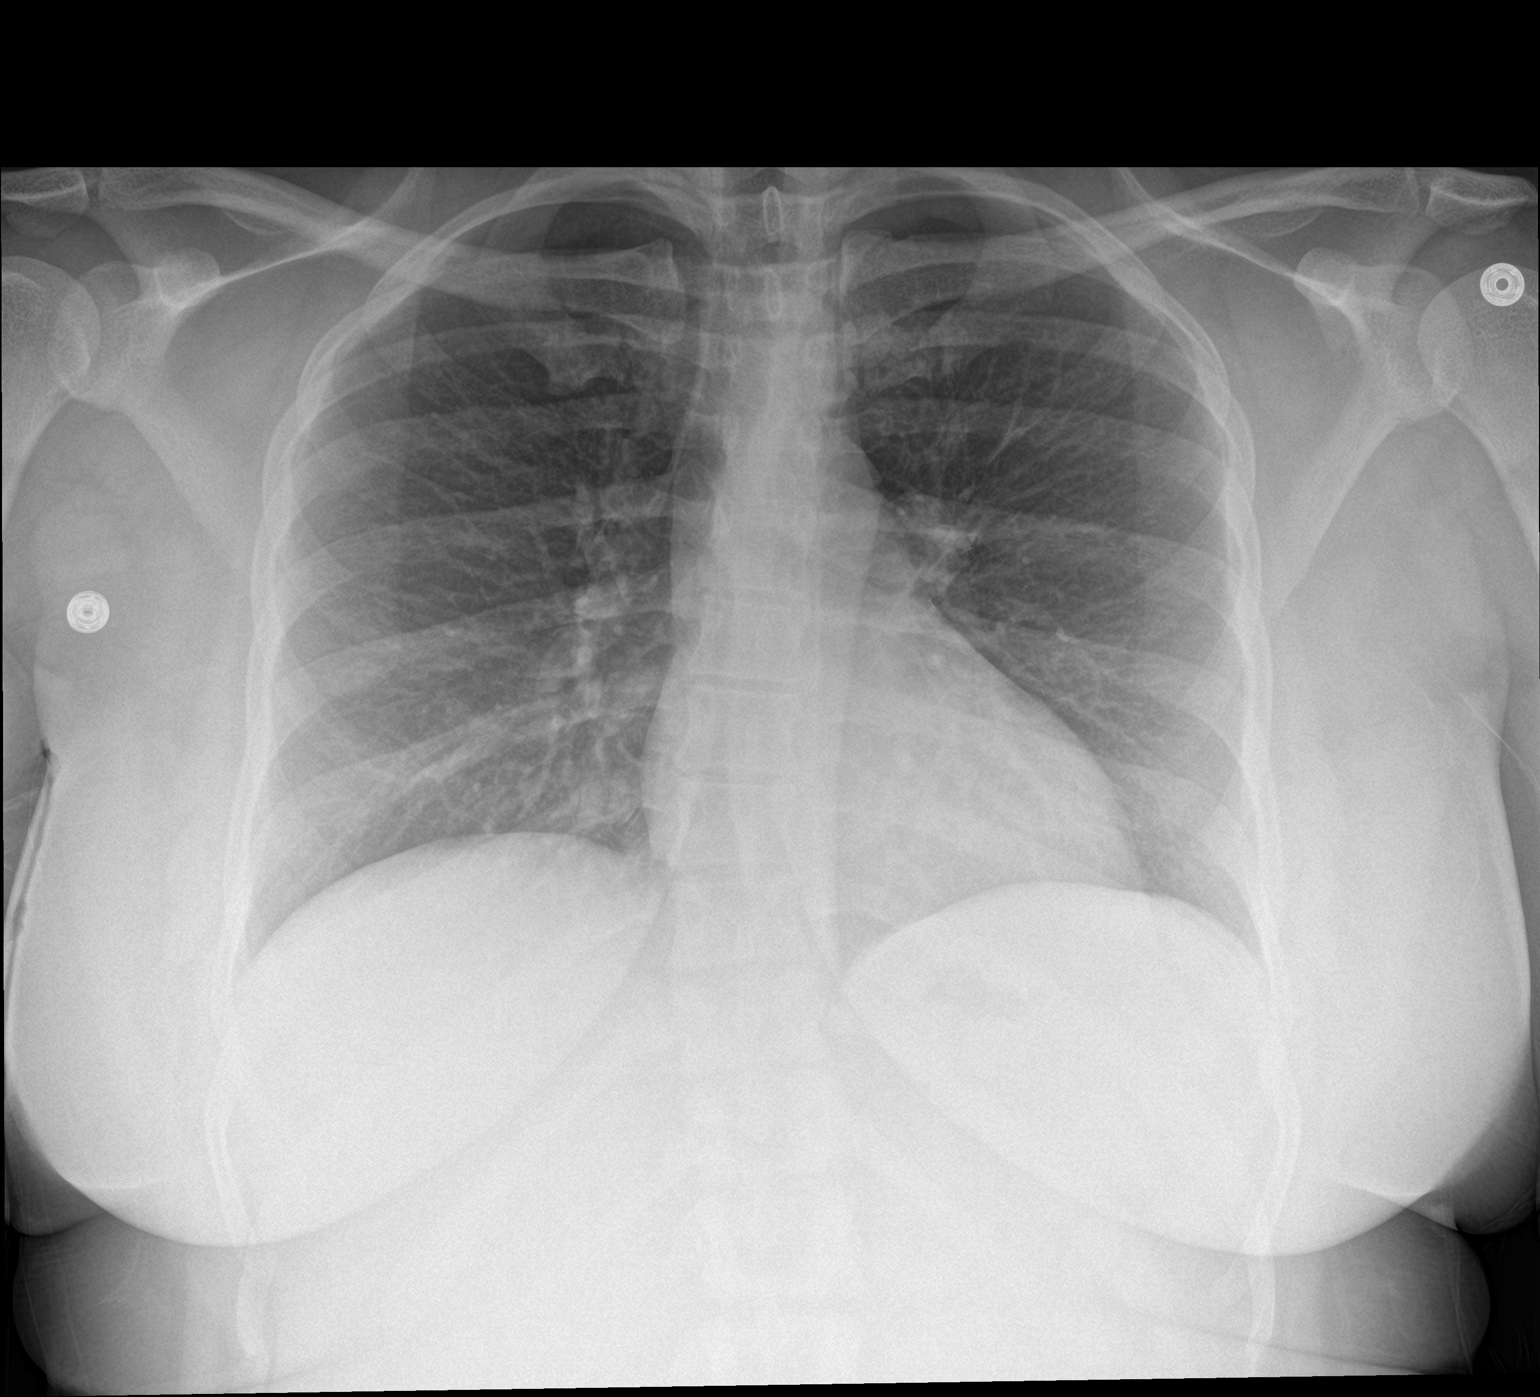

[chest lat]
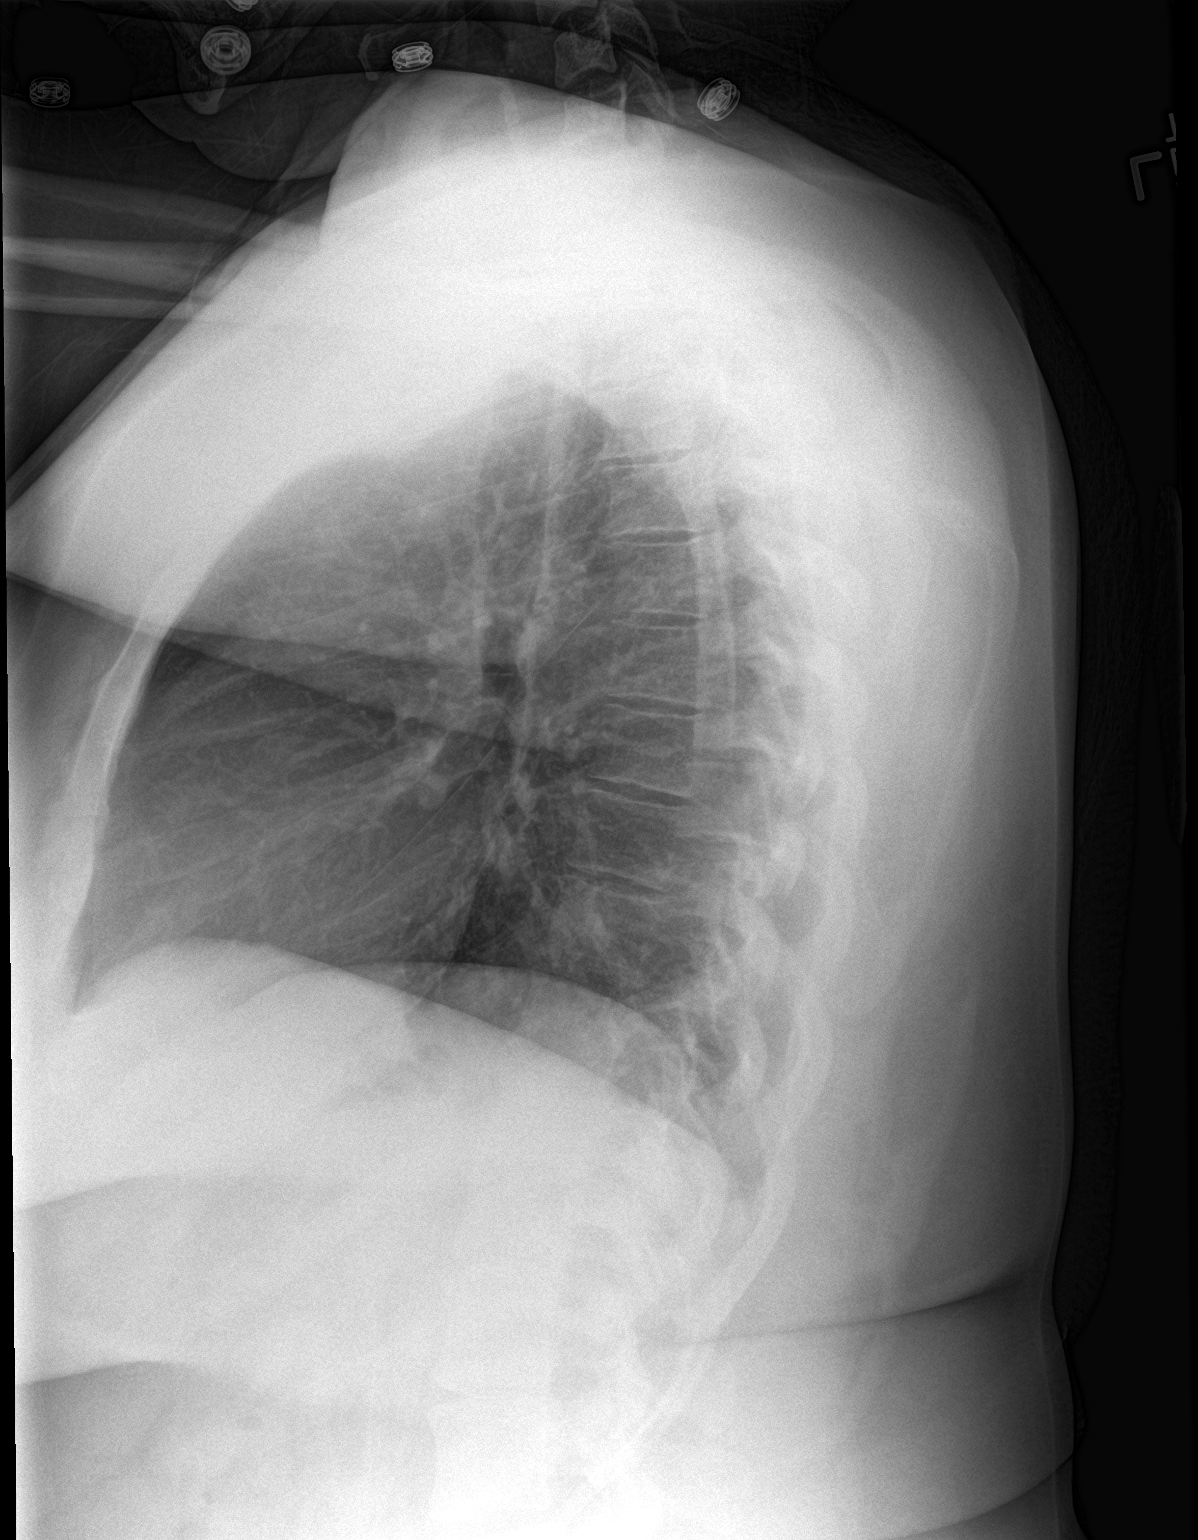

[2 of 2 positions shown; findings below may reference images not displayed]

FINDINGS: The lungs are well-aerated and clear. There is no evidence of focal
opacification, pleural effusion or pneumothorax.

The heart is normal in size; the mediastinal contour is within
normal limits. No acute osseous abnormalities are seen.
IMPRESSION: No acute cardiopulmonary process seen.

## 2016-07-07 ENCOUNTER — Ambulatory Visit: Payer: Medicaid Other | Admitting: Physician Assistant

## 2016-07-07 ENCOUNTER — Encounter: Payer: Self-pay | Admitting: Physician Assistant

## 2016-07-07 VITALS — BP 138/76 | HR 102 | Temp 97.9°F | Ht 65.0 in | Wt 200.8 lb

## 2016-07-07 DIAGNOSIS — Z9119 Patient's noncompliance with other medical treatment and regimen: Secondary | ICD-10-CM

## 2016-07-07 DIAGNOSIS — IMO0001 Reserved for inherently not codable concepts without codable children: Secondary | ICD-10-CM

## 2016-07-07 DIAGNOSIS — E1165 Type 2 diabetes mellitus with hyperglycemia: Principal | ICD-10-CM

## 2016-07-07 DIAGNOSIS — Z91199 Patient's noncompliance with other medical treatment and regimen due to unspecified reason: Secondary | ICD-10-CM

## 2016-07-07 MED ORDER — METFORMIN HCL 500 MG PO TABS: 500.0000 mg | ORAL_TABLET | Freq: Two times a day (BID) | ORAL | 0 refills | Status: DC

## 2016-07-07 NOTE — Progress Notes (Signed)
BP 138/76 (BP Location: Left Arm, Patient Position: Sitting, Cuff Size: Normal)   Pulse (!) 102   Temp 97.9 F (36.6 C) (Other (Comment))   Ht 5\' 5"  (1.651 m)   Wt 200 lb 12.8 oz (91.1 kg)   LMP 07/04/2016 (Exact Date)   SpO2 97%   BMI 33.41 kg/m    Subjective:    Patient ID: Mallory Mcdowell, female    DOB: [birth year], 30 y.o.   MRN: [id number]  HPI: Mallory Mcdowell is a 30 y.o. female presenting on 07/07/2016 for Diabetes   HPI   Pt had new pt appointment in June but she never went and got her labs drawn.  She no-showed to her f/u appointment in July.  She is here today because she says she needs refill on her metformin.  She says she doesn't take the metformin bid as prescribed but just takes it when she feels like her sugars are high.  Relevant past medical, surgical, family and social history reviewed and updated as indicated. Interim medical history since our last visit reviewed. Allergies and medications reviewed and updated.   Current Outpatient Prescriptions:  .  metFORMIN (GLUCOPHAGE) 500 MG tablet, Take 1 tablet (500 mg total) by mouth 2 (two) times daily with a meal. Reported on 04/30/2016, Disp: 60 tablet, Rfl: 0   Review of Systems  Constitutional: Positive for appetite change. Negative for chills, diaphoresis, fatigue, fever and unexpected weight change.  HENT: Positive for congestion, dental problem, sneezing and voice change. Negative for drooling, ear pain, facial swelling, hearing loss, mouth sores, sore throat and trouble swallowing.   Eyes: Negative for pain, discharge, redness, itching and visual disturbance.  Respiratory: Positive for cough. Negative for choking, shortness of breath and wheezing.   Cardiovascular: Negative for chest pain, palpitations and leg swelling.  Gastrointestinal: Negative for abdominal pain, blood in stool, constipation, diarrhea and vomiting.  Endocrine: Negative for cold intolerance, heat intolerance and polydipsia.   Genitourinary: Negative for decreased urine volume, dysuria and hematuria.  Musculoskeletal: Negative for arthralgias, back pain and gait problem.  Skin: Negative for rash.  Allergic/Immunologic: Negative for environmental allergies.  Neurological: Negative for seizures, syncope, light-headedness and headaches.  Hematological: Negative for adenopathy.  Psychiatric/Behavioral: Negative for agitation, dysphoric mood and suicidal ideas. The patient is not nervous/anxious.     Per HPI unless specifically indicated above     Objective:    BP 138/76 (BP Location: Left Arm, Patient Position: Sitting, Cuff Size: Normal)   Pulse (!) 102   Temp 97.9 F (36.6 C) (Other (Comment))   Ht 5\' 5"  (1.651 m)   Wt 200 lb 12.8 oz (91.1 kg)   LMP 07/04/2016 (Exact Date)   SpO2 97%   BMI 33.41 kg/m   Wt Readings from Last 3 Encounters:  07/07/16 200 lb 12.8 oz (91.1 kg)  04/30/16 200 lb 3.2 oz (90.8 kg)  03/06/16 190 lb (86.2 kg)    Physical Exam  Constitutional: She is oriented to person, place, and time. She appears well-developed and well-nourished.  HENT:  Head: Normocephalic and atraumatic.  Neck: Neck supple.  Cardiovascular: Normal rate and regular rhythm.   Pulmonary/Chest: Effort normal and breath sounds normal.  Abdominal: Soft. Bowel sounds are normal. She exhibits no mass. There is no hepatosplenomegaly. There is no tenderness.  Musculoskeletal: She exhibits no edema.  Lymphadenopathy:    She has no cervical adenopathy.  Neurological: She is alert and oriented to person, place, and time.  Skin: Skin is warm  and dry.  Psychiatric: She has a normal mood and affect. Her behavior is normal.  Vitals reviewed.   Results for orders placed or performed in visit on 04/30/16  POCT Glucose (CBG)  Result Value Ref Range   POC Glucose 176 (A) 70 - 99 mg/dl      Assessment & Plan:   Encounter Diagnoses  Name Primary?  [name] Uncontrolled diabetes mellitus type 2 without complications,  unspecified long term insulin use status ([place]) Yes  . Personal history of noncompliance with medical treatment, presenting hazards to health     -pt to Get labs drawn -rx given -F/u 1 month

## 2016-07-08 ENCOUNTER — Other Ambulatory Visit: Payer: Self-pay | Admitting: Physician Assistant

## 2016-07-08 LAB — COMPLETE METABOLIC PANEL WITH GFR
ALBUMIN: 3.6 g/dL (ref 3.6–5.1)
ALK PHOS: 98 U/L (ref 33–115)
ALT: 18 U/L (ref 6–29)
AST: 12 U/L (ref 10–30)
BILIRUBIN TOTAL: 0.6 mg/dL (ref 0.2–1.2)
BUN: 11 mg/dL (ref 7–25)
CO2: 24 mmol/L (ref 20–31)
Calcium: 8.9 mg/dL (ref 8.6–10.2)
Chloride: 104 mmol/L (ref 98–110)
Creat: 0.68 mg/dL (ref 0.50–1.10)
GLUCOSE: 174 mg/dL — AB (ref 65–99)
POTASSIUM: 4.2 mmol/L (ref 3.5–5.3)
SODIUM: 138 mmol/L (ref 135–146)
TOTAL PROTEIN: 6.4 g/dL (ref 6.1–8.1)

## 2016-07-08 LAB — CBC WITH DIFFERENTIAL/PLATELET
BASOS PCT: 1 %
Basophils Absolute: 75 cells/uL (ref 0–200)
EOS PCT: 4 %
Eosinophils Absolute: 300 cells/uL (ref 15–500)
HCT: 36.1 % (ref 35.0–45.0)
HEMOGLOBIN: 11.8 g/dL (ref 11.7–15.5)
LYMPHS ABS: 2925 {cells}/uL (ref 850–3900)
Lymphocytes Relative: 39 %
MCH: 27.3 pg (ref 27.0–33.0)
MCHC: 32.7 g/dL (ref 32.0–36.0)
MCV: 83.6 fL (ref 80.0–100.0)
MONOS PCT: 8 %
MPV: 9.4 fL (ref 7.5–12.5)
Monocytes Absolute: 600 cells/uL (ref 200–950)
NEUTROS ABS: 3600 {cells}/uL (ref 1500–7800)
Neutrophils Relative %: 48 %
PLATELETS: 434 10*3/uL — AB (ref 140–400)
RBC: 4.32 MIL/uL (ref 3.80–5.10)
RDW: 13.4 % (ref 11.0–15.0)
WBC: 7.5 10*3/uL (ref 3.8–10.8)

## 2016-07-08 LAB — HEMOGLOBIN A1C
HEMOGLOBIN A1C: 9 % — AB (ref <5.7)
Mean Plasma Glucose: 212 mg/dL

## 2016-07-08 LAB — LIPID PANEL
CHOL/HDL RATIO: 7.2 ratio — AB (ref <=5.0)
CHOLESTEROL: 202 mg/dL — AB (ref 125–200)
HDL: 28 mg/dL — AB (ref >=46)
LDL Cholesterol: 147 mg/dL — ABNORMAL HIGH (ref <130)
TRIGLYCERIDES: 134 mg/dL (ref <150)
VLDL: 27 mg/dL (ref <30)

## 2016-07-08 LAB — TSH: TSH: 1.56 mIU/L

## 2016-07-09 LAB — MICROALBUMIN, URINE: Microalb, Ur: 3.1 mg/dL

## 2016-08-04 ENCOUNTER — Ambulatory Visit: Payer: Medicaid Other | Admitting: Physician Assistant

## 2016-08-06 ENCOUNTER — Encounter: Payer: Self-pay | Admitting: Physician Assistant

## 2016-08-06 ENCOUNTER — Emergency Department (HOSPITAL_COMMUNITY)
Admission: EM | Admit: 2016-08-06 | Discharge: 2016-08-07 | Disposition: A | Discharge status: Home / Self Care | Payer: Medicaid Other | Attending: Emergency Medicine | Admitting: Emergency Medicine

## 2016-08-06 ENCOUNTER — Encounter (HOSPITAL_COMMUNITY): Payer: Self-pay | Admitting: *Deleted

## 2016-08-06 DIAGNOSIS — E119 Type 2 diabetes mellitus without complications: Secondary | ICD-10-CM | POA: Insufficient documentation

## 2016-08-06 DIAGNOSIS — B9689 Other specified bacterial agents as the cause of diseases classified elsewhere: Secondary | ICD-10-CM

## 2016-08-06 DIAGNOSIS — Z7984 Long term (current) use of oral hypoglycemic drugs: Secondary | ICD-10-CM | POA: Insufficient documentation

## 2016-08-06 DIAGNOSIS — N760 Acute vaginitis: Secondary | ICD-10-CM | POA: Insufficient documentation

## 2016-08-06 DIAGNOSIS — F17210 Nicotine dependence, cigarettes, uncomplicated: Secondary | ICD-10-CM | POA: Insufficient documentation

## 2016-08-06 DIAGNOSIS — I10 Essential (primary) hypertension: Secondary | ICD-10-CM | POA: Insufficient documentation

## 2016-08-06 NOTE — ED Triage Notes (Signed)
Pt c/o vaginal discharge and odor that started 2-3 days ago,

## 2016-08-06 NOTE — ED Provider Notes (Signed)
[facility] DEPT Provider Note   CSN: [id number] Arrival date & time: 08/06/16  2317 By signing my name below, I, [name], attest that this documentation has been prepared under the direction and in the presence of No att. providers found . Electronically Signed: [name], Scribe. 08/06/2016. 12:01 AM.   History   Chief Complaint Chief Complaint  Patient presents with  . Vaginal Discharge   HPI Mallory Mcdowell is a 30 y.o. female who presents to the Emergency Department complaining of vaginal discharge onset 2-3 days ago. She notes associated odor. Pt states she has experienced similar symptoms in the past when she had a yeast or bacterial infection. No alleviating or modifying factors noted.  She has no other complaints at this time.   The history is provided by the patient. No language interpreter was used.   Past Medical History:  Diagnosis Date  . Diabetes mellitus without complication ([place])   . Hypertension     Patient Active Problem List   Diagnosis Date Noted  . Cigarette nicotine dependence without complication [id number]  . Uncontrolled diabetes mellitus type 2 without complications ([place]) [id number]  . Elevated platelet count ([place]) 05/01/2016  . Alcohol abuse 05/01/2016    History reviewed. No pertinent surgical history.  OB History    Gravida Para Term Preterm AB Living   0 0 0 0 0 0   SAB TAB Ectopic Multiple Live Births   0 0 0 0        Home Medications    Prior to Admission medications   Medication Sig Start Date End Date Taking? Authorizing Provider  metFORMIN (GLUCOPHAGE) 500 MG tablet Take 1 tablet (500 mg total) by mouth 2 (two) times daily with a meal. Reported on 04/30/2016 07/07/16   Soyla Dryer, PA-C  metroNIDAZOLE (FLAGYL) 500 MG tablet Take 1 tablet (500 mg total) by mouth 2 (two) times daily. One po bid x 7 days 08/07/16   Orpah Greek, MD    Family History Family History  Problem Relation Age of Onset  . Asthma  Mother   . Diabetes Other   . Heart failure Other   . Cancer Other     Social History Social History  Substance Use Topics  . Smoking status: Current Every Day Smoker    Packs/day: 0.50    Types: Cigarettes  . Smokeless tobacco: Never Used  . Alcohol use 0.6 oz/week    1 Cans of beer per week     Comment: drinks 8% beers, liquor on the weekends- 1 pt/day- hx alcoholism     Allergies   Review of patient's allergies indicates no known allergies.  Review of Systems Review of Systems  Constitutional: Negative for fever.  Genitourinary: Positive for vaginal discharge.       Vaginal odor  All other systems reviewed and are negative.  Physical Exam Updated Vital Signs BP 153/94   Pulse 80   Temp 98.3 F (36.8 C) (Oral)   Resp 20   Ht 5\' 4"  (1.626 m)   Wt 174 lb 6 oz (79.1 kg)   SpO2 100%   BMI 29.93 kg/m   Physical Exam  Constitutional: She is oriented to person, place, and time. She appears well-developed and well-nourished. No distress.  HENT:  Head: Normocephalic and atraumatic.  Right Ear: Hearing normal.  Left Ear: Hearing normal.  Nose: Nose normal.  Mouth/Throat: Oropharynx is clear and moist and mucous membranes are normal.  Eyes: Conjunctivae and EOM are normal. Pupils are  equal, round, and reactive to light.  Neck: Normal range of motion. Neck supple.  Cardiovascular: Regular rhythm, S1 normal and S2 normal.  Exam reveals no gallop and no friction rub.   No murmur heard. Pulmonary/Chest: Effort normal and breath sounds normal. No respiratory distress. She exhibits no tenderness.  Abdominal: Soft. Normal appearance and bowel sounds are normal. There is no hepatosplenomegaly. There is no tenderness. There is no rebound, no guarding, no tenderness at McBurney's point and negative Murphy's sign. No hernia. Hernia confirmed negative in the right inguinal area and confirmed negative in the left inguinal area.  Genitourinary: Vagina normal and uterus normal. There  is no rash or tenderness on the right labia. There is no rash or tenderness on the left labia. Cervix exhibits no motion tenderness, no discharge and no friability. Right adnexum displays no mass and no tenderness. Left adnexum displays no mass and no tenderness.  Musculoskeletal: Normal range of motion.  Lymphadenopathy:       Right: No inguinal adenopathy present.       Left: No inguinal adenopathy present.  Neurological: She is alert and oriented to person, place, and time. She has normal strength. No cranial nerve deficit or sensory deficit. Coordination normal. GCS eye subscore is 4. GCS verbal subscore is 5. GCS motor subscore is 6.  Skin: Skin is warm, dry and intact. No rash noted. No cyanosis.  Psychiatric: She has a normal mood and affect. Her speech is normal and behavior is normal. Thought content normal.  Nursing note and vitals reviewed.  ED Treatments / Results  DIAGNOSTIC STUDIES:  Oxygen Saturation is 100% on RA, normal by my interpretation.    COORDINATION OF CARE:  12:02 PM Will order POCT pregnancy, GC/ chlaymdia probe, and wet prep genital. Discussed treatment plan with pt at bedside and pt agreed to plan.   Labs (all labs ordered are listed, but only abnormal results are displayed) Labs Reviewed  WET PREP, GENITAL - Abnormal; Notable for the following:       Result Value   Clue Cells Wet Prep HPF POC PRESENT (*)    WBC, Wet Prep HPF POC MODERATE (*)    All other components within normal limits  POC URINE PREG, ED  GC/CHLAMYDIA PROBE AMP ([place]) NOT AT [facility]    EKG  EKG Interpretation None      Radiology No results found.  Procedures Procedures (including critical care time)  Medications Ordered in ED Medications - No data to display   Initial Impression / Assessment and Plan / ED Course  I have reviewed the triage vital signs and the nursing notes.  Pertinent labs & imaging results that were available during my care of the patient were  reviewed by me and considered in my medical decision making (see chart for details).  Clinical Course    Patient presents to the ER for evaluation of vaginal discharge. Pelvic examination did not reveal any evidence of cervical motion tenderness. She had minimal if any discharge. Wet prep revealed clue cells. Patient treated for bacterial vaginosis.  Final Clinical Impressions(s) / ED Diagnoses   Final diagnoses:  BV (bacterial vaginosis)    New Prescriptions Discharge Medication List as of 08/07/2016 12:33 AM    START taking these medications   Details  metroNIDAZOLE (FLAGYL) 500 MG tablet Take 1 tablet (500 mg total) by mouth 2 (two) times daily. One po bid x 7 days, Starting Fri 08/07/2016, Print      I personally performed the  services described in this documentation, which was scribed in my presence. The recorded information has been reviewed and is accurate.    Orpah Greek, MD 08/07/16 [phone]

## 2016-08-07 LAB — POC URINE PREG, ED: PREG TEST UR: NEGATIVE

## 2016-08-07 LAB — WET PREP, GENITAL
SPERM: NONE SEEN
TRICH WET PREP: NONE SEEN
Yeast Wet Prep HPF POC: NONE SEEN

## 2016-08-07 MED ORDER — METRONIDAZOLE 500 MG PO TABS: 500.0000 mg | ORAL_TABLET | Freq: Two times a day (BID) | ORAL | 0 refills | Status: DC

## 2016-08-10 LAB — GC/CHLAMYDIA PROBE AMP (~~LOC~~) NOT AT ARMC
Chlamydia: NEGATIVE
NEISSERIA GONORRHEA: NEGATIVE

## 2016-09-01 ENCOUNTER — Emergency Department (HOSPITAL_COMMUNITY)
Admission: EM | Admit: 2016-09-01 | Discharge: 2016-09-01 | Disposition: A | Discharge status: Home / Self Care | Payer: Medicaid Other | Attending: Emergency Medicine | Admitting: Emergency Medicine

## 2016-09-01 ENCOUNTER — Emergency Department (HOSPITAL_COMMUNITY): Payer: Medicaid Other

## 2016-09-01 ENCOUNTER — Encounter (HOSPITAL_COMMUNITY): Payer: Self-pay | Admitting: Emergency Medicine

## 2016-09-01 DIAGNOSIS — I10 Essential (primary) hypertension: Secondary | ICD-10-CM | POA: Insufficient documentation

## 2016-09-01 DIAGNOSIS — Y999 Unspecified external cause status: Secondary | ICD-10-CM | POA: Insufficient documentation

## 2016-09-01 DIAGNOSIS — Y92009 Unspecified place in unspecified non-institutional (private) residence as the place of occurrence of the external cause: Secondary | ICD-10-CM | POA: Insufficient documentation

## 2016-09-01 DIAGNOSIS — S93401A Sprain of unspecified ligament of right ankle, initial encounter: Secondary | ICD-10-CM

## 2016-09-01 DIAGNOSIS — F17210 Nicotine dependence, cigarettes, uncomplicated: Secondary | ICD-10-CM | POA: Insufficient documentation

## 2016-09-01 DIAGNOSIS — Z791 Long term (current) use of non-steroidal anti-inflammatories (NSAID): Secondary | ICD-10-CM | POA: Insufficient documentation

## 2016-09-01 DIAGNOSIS — Z7984 Long term (current) use of oral hypoglycemic drugs: Secondary | ICD-10-CM | POA: Insufficient documentation

## 2016-09-01 DIAGNOSIS — E119 Type 2 diabetes mellitus without complications: Secondary | ICD-10-CM | POA: Insufficient documentation

## 2016-09-01 DIAGNOSIS — W109XXA Fall (on) (from) unspecified stairs and steps, initial encounter: Secondary | ICD-10-CM | POA: Insufficient documentation

## 2016-09-01 DIAGNOSIS — Y939 Activity, unspecified: Secondary | ICD-10-CM | POA: Insufficient documentation

## 2016-09-01 MED ORDER — IBUPROFEN 800 MG PO TABS: 800.0000 mg | ORAL_TABLET | Freq: Three times a day (TID) | ORAL | 0 refills | Status: DC

## 2016-09-01 NOTE — ED Triage Notes (Signed)
Pt states she fell at the bottom of the steps and twisted her R ankle.

## 2016-09-01 NOTE — ED Provider Notes (Signed)
[facility] DEPT Provider Note   CSN: [id number] Arrival date & time: 09/01/16  2003     History   Chief Complaint Chief Complaint  Patient presents with  . Ankle Pain    HPI Mallory Mcdowell is a 30 y.o. female.  The history is provided by the patient. No language interpreter was used.  Ankle Pain   The incident occurred 3 to 5 hours ago. The incident occurred at home. The injury mechanism was a fall. The pain is present in the right ankle. The pain is moderate. The pain has been constant since onset. She reports no foreign bodies present. She has tried nothing for the symptoms. The treatment provided no relief.  Pt reports she fell off a stair and turned her ankle.  Pt complains of swelling and pain  Past Medical History:  Diagnosis Date  . Diabetes mellitus without complication ([place])   . Hypertension     Patient Active Problem List   Diagnosis Date Noted  . Cigarette nicotine dependence without complication [id number]  . Uncontrolled diabetes mellitus type 2 without complications ([place]) [id number]  . Elevated platelet count ([place]) 05/01/2016  . Alcohol abuse 05/01/2016    History reviewed. No pertinent surgical history.  OB History    Gravida Para Term Preterm AB Living   0 0 0 0 0 0   SAB TAB Ectopic Multiple Live Births   0 0 0 0         Home Medications    Prior to Admission medications   Medication Sig Start Date End Date Taking? Authorizing Provider  ibuprofen (ADVIL,MOTRIN) 800 MG tablet Take 1 tablet (800 mg total) by mouth 3 (three) times daily. 09/01/16   Fransico Meadow, PA-C  metFORMIN (GLUCOPHAGE) 500 MG tablet Take 1 tablet (500 mg total) by mouth 2 (two) times daily with a meal. Reported on 04/30/2016 07/07/16   Soyla Dryer, PA-C  metroNIDAZOLE (FLAGYL) 500 MG tablet Take 1 tablet (500 mg total) by mouth 2 (two) times daily. One po bid x 7 days 08/07/16   Orpah Greek, MD    Family History Family History  Problem Relation Age of  Onset  . Asthma Mother   . Diabetes Other   . Heart failure Other   . Cancer Other     Social History Social History  Substance Use Topics  . Smoking status: Current Every Day Smoker    Packs/day: 0.50    Types: Cigarettes  . Smokeless tobacco: Never Used  . Alcohol use 0.6 oz/week    1 Cans of beer per week     Comment: drinks 8% beers, liquor on the weekends- 1 pt/day- hx alcoholism     Allergies   Review of patient's allergies indicates no known allergies.   Review of Systems Review of Systems  All other systems reviewed and are negative.    Physical Exam Updated Vital Signs BP 161/99 (BP Location: Left Arm)   Pulse 76   Temp 98.3 F (36.8 C) (Oral)   Resp 18   Ht 5\' 4"  (1.626 m)   Wt 79.4 kg   LMP 08/18/2016   SpO2 100%   BMI 30.04 kg/m   Physical Exam  Constitutional: She is oriented to person, place, and time. She appears well-developed and well-nourished. No distress.  HENT:  Head: Normocephalic and atraumatic.  Eyes: Conjunctivae and EOM are normal.  Neck: Normal range of motion. Neck supple.  Cardiovascular: Normal rate and regular rhythm.   No murmur heard.  Pulmonary/Chest: Effort normal and breath sounds normal. No respiratory distress.  Abdominal: Soft. She exhibits no distension. There is no tenderness.  Musculoskeletal: She exhibits no edema.  Swollen tender right ankle,  Pain with range of motion,  nv and ns intact    Neurological: She is alert and oriented to person, place, and time.  Skin: Skin is warm and dry.  Psychiatric: She has a normal mood and affect.  Nursing note and vitals reviewed.    ED Treatments / Results  Labs (all labs ordered are listed, but only abnormal results are displayed) Labs Reviewed - No data to display  EKG  EKG Interpretation None       Radiology Dg Ankle Complete Right  Result Date: 09/01/2016 CLINICAL DATA:  Patient tripped and fell to steps on Sunday complaining of pain and swelling of the  right ankle. EXAM: RIGHT ANKLE - COMPLETE 3+ VIEW COMPARISON:  None. FINDINGS: There is soft tissue swelling over the malleoli. Ankle mortise is maintained. Base of fifth metatarsal appears intact. The may be a small ankle joint effusion. Haglund deformity characterized by a small bony protuberance off the posterior calcaneus is noted incidentally. This may predispose the patient to retrocalcaneal bursitis; not seen on current study. IMPRESSION: Soft tissue swelling about the malleoli more so laterally. No acute osseous abnormality is seen. Haglund deformity of the posterior calcaneus. Electronically Signed   By: Briceyda Royalty M.D.   On: 09/01/2016 21:05    Procedures Procedures (including critical care time)  Medications Ordered in ED Medications - No data to display   Initial Impression / Assessment and Plan / ED Course  I have reviewed the triage vital signs and the nursing notes.  Pertinent labs & imaging results that were available during my care of the patient were reviewed by me and considered in my medical decision making (see chart for details).  Clinical Course    [place]. [place].  Follow up with Dr. Aline Brochure in one week for recheck    Final Clinical Impressions(s) / ED Diagnoses   Final diagnoses:  Sprain of right ankle, unspecified ligament, initial encounter    New Prescriptions New Prescriptions   IBUPROFEN (ADVIL,MOTRIN) 800 MG TABLET    Take 1 tablet (800 mg total) by mouth 3 (three) times daily.  An After Visit Summary was printed and given to the patient.   Fransico Meadow, PA-C 09/01/16 [id number]    Gwenyth Allegra Tegeler, MD 09/02/16 [phone]

## 2016-09-07 ENCOUNTER — Encounter: Payer: Self-pay | Admitting: Physician Assistant

## 2016-09-07 ENCOUNTER — Ambulatory Visit: Payer: Medicaid Other | Admitting: Physician Assistant

## 2016-09-07 VITALS — BP 128/76 | HR 105 | Temp 98.1°F | Ht 65.0 in | Wt 202.6 lb

## 2016-09-07 DIAGNOSIS — F17210 Nicotine dependence, cigarettes, uncomplicated: Secondary | ICD-10-CM

## 2016-09-07 DIAGNOSIS — IMO0001 Reserved for inherently not codable concepts without codable children: Secondary | ICD-10-CM

## 2016-09-07 DIAGNOSIS — E1165 Type 2 diabetes mellitus with hyperglycemia: Principal | ICD-10-CM

## 2016-09-07 DIAGNOSIS — Z6833 Body mass index (BMI) 33.0-33.9, adult: Secondary | ICD-10-CM

## 2016-09-07 DIAGNOSIS — E785 Hyperlipidemia, unspecified: Secondary | ICD-10-CM

## 2016-09-07 DIAGNOSIS — E669 Obesity, unspecified: Secondary | ICD-10-CM

## 2016-09-07 MED ORDER — METFORMIN HCL 500 MG PO TABS: 500.0000 mg | ORAL_TABLET | Freq: Two times a day (BID) | ORAL | 4 refills | Status: DC

## 2016-09-07 MED ORDER — LOVASTATIN 20 MG PO TABS: 20.0000 mg | ORAL_TABLET | Freq: Every day | ORAL | 4 refills | Status: DC

## 2016-09-07 NOTE — Progress Notes (Signed)
BP 128/76 (BP Location: Left Arm, Patient Position: Sitting, Cuff Size: Normal)   Pulse (!) 105   Temp 98.1 F (36.7 C)   Ht 5\' 5"  (1.651 m)   Wt 202 lb 9.6 oz (91.9 kg)   LMP 08/18/2016   SpO2 98%   BMI 33.71 kg/m    Subjective:    Patient ID: Mallory Mcdowell, female    DOB: [birth year], 30 y.o.   MRN: [id number]  HPI: Mallory Mcdowell is a 30 y.o. female presenting on 09/07/2016 for Diabetes   HPI Pt feels well today. Currently pt is only taking her metformin "every now and then".  She is afraid that taking it regularly will cause her bs to go too low  Relevant past medical, surgical, family and social history reviewed and updated as indicated. Interim medical history since our last visit reviewed. Allergies and medications reviewed and updated.   Current Outpatient Prescriptions:  .  metFORMIN (GLUCOPHAGE) 500 MG tablet, Take 1 tablet (500 mg total) by mouth 2 (two) times daily with a meal. Reported on 04/30/2016, Disp: 60 tablet, Rfl: 0   Review of Systems  Constitutional: Negative for appetite change, chills, diaphoresis, fatigue, fever and unexpected weight change.  HENT: Positive for dental problem. Negative for congestion, drooling, ear pain, facial swelling, hearing loss, mouth sores, sneezing, sore throat, trouble swallowing and voice change.   Eyes: Negative for pain, discharge, redness, itching and visual disturbance.  Respiratory: Negative for cough, choking, shortness of breath and wheezing.   Cardiovascular: Negative for chest pain, palpitations and leg swelling.  Gastrointestinal: Negative for abdominal pain, blood in stool, constipation, diarrhea and vomiting.  Endocrine: Negative for cold intolerance, heat intolerance and polydipsia.  Genitourinary: Negative for decreased urine volume, dysuria and hematuria.  Musculoskeletal: Negative for arthralgias, back pain and gait problem.  Skin: Negative for rash.  Allergic/Immunologic: Negative for environmental  allergies.  Neurological: Negative for seizures, syncope, light-headedness and headaches.  Hematological: Negative for adenopathy.  Psychiatric/Behavioral: Negative for agitation, dysphoric mood and suicidal ideas. The patient is not nervous/anxious.     Per HPI unless specifically indicated above     Objective:    BP 128/76 (BP Location: Left Arm, Patient Position: Sitting, Cuff Size: Normal)   Pulse (!) 105   Temp 98.1 F (36.7 C)   Ht 5\' 5"  (1.651 m)   Wt 202 lb 9.6 oz (91.9 kg)   LMP 08/18/2016   SpO2 98%   BMI 33.71 kg/m   Wt Readings from Last 3 Encounters:  09/07/16 202 lb 9.6 oz (91.9 kg)  09/01/16 175 lb (79.4 kg)  08/06/16 174 lb 6 oz (79.1 kg)    Physical Exam  Constitutional: She is oriented to person, place, and time. She appears well-developed and well-nourished.  HENT:  Head: Normocephalic and atraumatic.  Neck: Neck supple.  Cardiovascular: Normal rate and regular rhythm.   Pulmonary/Chest: Effort normal and breath sounds normal.  Abdominal: Soft. Bowel sounds are normal. She exhibits no mass. There is no hepatosplenomegaly. There is no tenderness.  Musculoskeletal: She exhibits no edema.  Lymphadenopathy:    She has no cervical adenopathy.  Neurological: She is alert and oriented to person, place, and time.  Skin: Skin is warm and dry.  Psychiatric: She has a normal mood and affect. Her behavior is normal.  Vitals reviewed.    Weight rechecked- correct    a1c- 9.0 LDL 147    Assessment & Plan:    Encounter Diagnoses  Name Primary?  [name]  Uncontrolled diabetes mellitus type 2 without complications, unspecified long term insulin use status ([place]) Yes  . Hyperlipidemia, unspecified hyperlipidemia type   . Cigarette nicotine dependence without complication   . Class 1 obesity with serious comorbidity and body mass index (BMI) of 33.0 to 33.9 in adult, unspecified obesity type     -reviewed labs with pt  -pt to Go to diabetes education class -pt  counseled to take metformin twice every day.  Reassured her that it will not cause her to go hypoglycemic.  Discussed that the dosage will likely need to be increased, but in light of her hesitance, will not increase it today. -rx lovastatin for lipids.  Pt states she is not planning to get pregnant.  She is on her menses now.  Counseled pt to use her family planning medicaid to go to gyn and get on birth control.  Discussed bad effects of lovastatin on a fetus were she to get pregnant.  She states understanding. Says she will get on the nuvaring -counseled on smoking cessation.  Discussed that she has DM and hyperlipidemia that are treatable but not her control.  The smoking is something that she can control and her health would be greatly improved with cessation.   -pt on list for diabetic eye exam -counseled pt on diet and exercise for weight management -follow up in 3 months.  RTO sooner prn  (The duration of this appointment visit was 25 minutes of face-to-face time with the patient.  Greater than 50% of this time was spent in counseling, explanation of diagnosis, planning of further management, and coordination of care.)

## 2016-09-07 NOTE — Patient Instructions (Addendum)
Diabetes Mellitus and Food It is important for you to manage your blood sugar (glucose) level. Your blood glucose level can be greatly affected by what you eat. Eating healthier foods in the appropriate amounts throughout the day at about the same time each day will help you control your blood glucose level. It can also help slow or prevent worsening of your diabetes mellitus. Healthy eating may even help you improve the level of your blood pressure and reach or maintain a healthy weight.  General recommendations for healthful eating and cooking habits include:  Eating meals and snacks regularly. Avoid going long periods of time without eating to lose weight.  Eating a diet that consists mainly of plant-based foods, such as fruits, vegetables, nuts, legumes, and whole grains.  Using low-heat cooking methods, such as baking, instead of high-heat cooking methods, such as deep frying. Work with your dietitian to make sure you understand how to use the Nutrition Facts information on food labels. HOW CAN FOOD AFFECT ME? Carbohydrates Carbohydrates affect your blood glucose level more than any other type of food. Your dietitian will help you determine how many carbohydrates to eat at each meal and teach you how to count carbohydrates. Counting carbohydrates is important to keep your blood glucose at a healthy level, especially if you are using insulin or taking certain medicines for diabetes mellitus. Alcohol Alcohol can cause sudden decreases in blood glucose (hypoglycemia), especially if you use insulin or take certain medicines for diabetes mellitus. Hypoglycemia can be a life-threatening condition. Symptoms of hypoglycemia (sleepiness, dizziness, and disorientation) are similar to symptoms of having too much alcohol.  If your health care provider has given you approval to drink alcohol, do so in moderation and use the following guidelines:  Women should not have more than one drink per day, and men  should not have more than two drinks per day. One drink is equal to:  12 oz of beer.  5 oz of wine.  1 oz of hard liquor.  Do not drink on an empty stomach.  Keep yourself hydrated. Have water, diet soda, or unsweetened iced tea.  Regular soda, juice, and other mixers might contain a lot of carbohydrates and should be counted. WHAT FOODS ARE NOT RECOMMENDED? As you make food choices, it is important to remember that all foods are not the same. Some foods have fewer nutrients per serving than other foods, even though they might have the same number of calories or carbohydrates. It is difficult to get your body what it needs when you eat foods with fewer nutrients. Examples of foods that you should avoid that are high in calories and carbohydrates but low in nutrients include:  Trans fats (most processed foods list trans fats on the Nutrition Facts label).  Regular soda.  Juice.  Candy.  Sweets, such as cake, pie, doughnuts, and cookies.  Fried foods. WHAT FOODS CAN I EAT? Eat nutrient-rich foods, which will nourish your body and keep you healthy. The food you should eat also will depend on several factors, including:  The calories you need.  The medicines you take.  Your weight.  Your blood glucose level.  Your blood pressure level.  Your cholesterol level. You should eat a variety of foods, including:  Protein.  Lean cuts of meat.  Proteins low in saturated fats, such as fish, egg whites, and beans. Avoid processed meats.  Fruits and vegetables.  Fruits and vegetables that may help control blood glucose levels, such as apples, mangoes, and   yams.  Dairy products.  Choose fat-free or low-fat dairy products, such as milk, yogurt, and cheese.  Grains, bread, pasta, and rice.  Choose whole grain products, such as multigrain bread, whole oats, and brown rice. These foods may help control blood pressure.  Fats.  Foods containing healthful fats, such as nuts,  avocado, olive oil, canola oil, and fish. DOES EVERYONE WITH DIABETES MELLITUS HAVE THE SAME MEAL PLAN? Because every person with diabetes mellitus is different, there is not one meal plan that works for everyone. It is very important that you meet with a dietitian who will help you create a meal plan that is just right for you.   This information is not intended to replace advice given to you by your health care provider. Make sure you discuss any questions you have with your health care provider.   Document Released: 07/23/2005 Document Revised: 11/16/2014 Document Reviewed: 09/22/2013 Elsevier Interactive Patient Education 2016 [employer] Inc[employer]    Fat and Cholesterol Restricted Diet High levels of fat and cholesterol in your blood may lead to various health problems, such as diseases of the heart, blood vessels, gallbladder, liver, and pancreas. Fats are concentrated sources of energy that come in various forms. Certain types of fat, including saturated fat, may be harmful in excess. Cholesterol is a substance needed by your body in small amounts. Your body makes all the cholesterol it needs. Excess cholesterol comes from the food you eat. When you have high levels of cholesterol and saturated fat in your blood, health problems can develop because the excess fat and cholesterol will gather along the walls of your blood vessels, causing them to narrow. Choosing the right foods will help you control your intake of fat and cholesterol. This will help keep the levels of these substances in your blood within normal limits and reduce your risk of disease. WHAT IS MY PLAN? Your health care provider recommends that you:  Get no more than __________ % of the total calories in your daily diet from fat.  Limit your intake of saturated fat to less than ______% of your total calories each day.  Limit the amount of cholesterol in your diet to less than _________mg per day. WHAT TYPES OF FAT SHOULD I  CHOOSE?  Choose healthy fats more often. Choose monounsaturated and polyunsaturated fats, such as olive and canola oil, flaxseeds, walnuts, almonds, and seeds.  Eat more omega-3 fats. Good choices include salmon, mackerel, sardines, tuna, flaxseed oil, and ground flaxseeds. Aim to eat fish at least two times a week.  Limit saturated fats. Saturated fats are primarily found in animal products, such as meats, butter, and cream. Plant sources of saturated fats include palm oil, palm kernel oil, and coconut oil.  Avoid foods with partially hydrogenated oils in them. These contain trans fats. Examples of foods that contain trans fats are stick margarine, some tub margarines, cookies, crackers, and other baked goods. WHAT GENERAL GUIDELINES DO I NEED TO FOLLOW? These guidelines for healthy eating will help you control your intake of fat and cholesterol:  Check food labels carefully to identify foods with trans fats or high amounts of saturated fat.  Fill one half of your plate with vegetables and green salads.  Fill one fourth of your plate with whole grains. Look for the word "whole" as the first word in the ingredient list.  Fill one fourth of your plate with lean protein foods.  Limit fruit to two servings a day. Choose fruit instead of juice.  Eat more foods that contain soluble fiber. Examples of foods that contain this type of fiber are apples, broccoli, carrots, beans, peas, and barley. Aim to get 20-30 g of fiber per day.  Eat more home-cooked food and less restaurant, buffet, and fast food.  Limit or avoid alcohol.  Limit foods high in starch and sugar.  Limit fried foods.  Cook foods using methods other than frying. Baking, boiling, grilling, and broiling are all great options.  Lose weight if you are overweight. Losing just 5-10% of your initial body weight can help your overall health and prevent diseases such as diabetes and heart disease. WHAT FOODS CAN I  EAT? Grains Whole grains, such as whole wheat or whole grain breads, crackers, cereals, and pasta. Unsweetened oatmeal, bulgur, barley, quinoa, or brown rice. Corn or whole wheat flour tortillas. Vegetables Fresh or frozen vegetables (raw, steamed, roasted, or grilled). Green salads. Fruits All fresh, canned (in natural juice), or frozen fruits. Meat and Other Protein Products Ground beef (85% or leaner), grass-fed beef, or beef trimmed of fat. Skinless chicken or [place]. Ground chicken or [place]. Pork trimmed of fat. All fish and seafood. Eggs. Dried beans, peas, or lentils. Unsalted nuts or seeds. Unsalted canned or dry beans. Dairy Low-fat dairy products, such as skim or 1% milk, 2% or reduced-fat cheeses, low-fat ricotta or cottage cheese, or plain low-fat yogurt. Fats and Oils Tub margarines without trans fats. Light or reduced-fat mayonnaise and salad dressings. Avocado. Olive, canola, sesame, or safflower oils. Natural peanut or almond butter (choose ones without added sugar and oil). The items listed above may not be a complete list of recommended foods or beverages. Contact your dietitian for more options. WHAT FOODS ARE NOT RECOMMENDED? Grains White bread. White pasta. White rice. Cornbread. Bagels, pastries, and croissants. Crackers that contain trans fat. Vegetables White potatoes. Corn. Creamed or fried vegetables. Vegetables in a cheese sauce. Fruits Dried fruits. Canned fruit in light or heavy syrup. Fruit juice. Meat and Other Protein Products Fatty cuts of meat. Ribs, chicken wings, bacon, sausage, bologna, salami, chitterlings, fatback, hot dogs, bratwurst, and packaged luncheon meats. Liver and organ meats. Dairy Whole or 2% milk, cream, half-and-half, and cream cheese. Whole milk cheeses. Whole-fat or sweetened yogurt. Full-fat cheeses. Nondairy creamers and whipped toppings. Processed cheese, cheese spreads, or cheese curds. Sweets and Desserts Corn syrup, sugars,  honey, and molasses. Candy. Jam and jelly. Syrup. Sweetened cereals. Cookies, pies, cakes, donuts, muffins, and ice cream. Fats and Oils Butter, stick margarine, lard, shortening, ghee, or bacon fat. Coconut, palm kernel, or palm oils. Beverages Alcohol. Sweetened drinks (such as sodas, lemonade, and fruit drinks or punches). The items listed above may not be a complete list of foods and beverages to avoid. Contact your dietitian for more information.   This information is not intended to replace advice given to you by your health care provider. Make sure you discuss any questions you have with your health care provider.   Document Released: 10/26/2005 Document Revised: 11/16/2014 Document Reviewed: 01/24/2014 Elsevier Interactive Patient Education [employer].

## 2016-09-23 ENCOUNTER — Telehealth (HOSPITAL_BASED_OUTPATIENT_CLINIC_OR_DEPARTMENT_OTHER): Payer: Self-pay | Admitting: Emergency Medicine

## 2016-12-01 ENCOUNTER — Other Ambulatory Visit: Payer: Self-pay

## 2016-12-01 DIAGNOSIS — IMO0001 Reserved for inherently not codable concepts without codable children: Secondary | ICD-10-CM

## 2016-12-01 DIAGNOSIS — E785 Hyperlipidemia, unspecified: Secondary | ICD-10-CM

## 2016-12-01 DIAGNOSIS — E1165 Type 2 diabetes mellitus with hyperglycemia: Principal | ICD-10-CM

## 2016-12-05 LAB — COMPREHENSIVE METABOLIC PANEL
ALBUMIN: 4 g/dL (ref 3.6–5.1)
ALK PHOS: 92 U/L (ref 33–115)
ALT: 14 U/L (ref 6–29)
AST: 10 U/L (ref 10–30)
BILIRUBIN TOTAL: 0.5 mg/dL (ref 0.2–1.2)
BUN: 8 mg/dL (ref 7–25)
CALCIUM: 9.1 mg/dL (ref 8.6–10.2)
CO2: 24 mmol/L (ref 20–31)
CREATININE: 0.48 mg/dL — AB (ref 0.50–1.10)
Chloride: 108 mmol/L (ref 98–110)
Glucose, Bld: 104 mg/dL — ABNORMAL HIGH (ref 65–99)
Potassium: 4.3 mmol/L (ref 3.5–5.3)
Sodium: 141 mmol/L (ref 135–146)
TOTAL PROTEIN: 7 g/dL (ref 6.1–8.1)

## 2016-12-05 LAB — LIPID PANEL
CHOLESTEROL: 179 mg/dL (ref <200)
HDL: 33 mg/dL — AB (ref >50)
LDL Cholesterol: 133 mg/dL — ABNORMAL HIGH (ref <100)
TRIGLYCERIDES: 64 mg/dL (ref <150)
Total CHOL/HDL Ratio: 5.4 Ratio — ABNORMAL HIGH (ref <5.0)
VLDL: 13 mg/dL (ref <30)

## 2016-12-07 LAB — HEMOGLOBIN A1C
HEMOGLOBIN A1C: 6.6 % — AB (ref <5.7)
MEAN PLASMA GLUCOSE: 143 mg/dL

## 2016-12-08 ENCOUNTER — Ambulatory Visit: Payer: Medicaid Other | Admitting: Physician Assistant

## 2016-12-08 ENCOUNTER — Encounter: Payer: Self-pay | Admitting: Physician Assistant

## 2016-12-08 VITALS — BP 130/80 | HR 99 | Temp 98.6°F | Ht 65.0 in | Wt 190.2 lb

## 2016-12-08 DIAGNOSIS — Z6831 Body mass index (BMI) 31.0-31.9, adult: Secondary | ICD-10-CM

## 2016-12-08 DIAGNOSIS — IMO0001 Reserved for inherently not codable concepts without codable children: Secondary | ICD-10-CM

## 2016-12-08 DIAGNOSIS — E785 Hyperlipidemia, unspecified: Secondary | ICD-10-CM

## 2016-12-08 DIAGNOSIS — E669 Obesity, unspecified: Secondary | ICD-10-CM

## 2016-12-08 DIAGNOSIS — F17210 Nicotine dependence, cigarettes, uncomplicated: Secondary | ICD-10-CM

## 2016-12-08 DIAGNOSIS — E1165 Type 2 diabetes mellitus with hyperglycemia: Principal | ICD-10-CM

## 2016-12-08 MED ORDER — LOVASTATIN 20 MG PO TABS: 20.0000 mg | ORAL_TABLET | Freq: Every day | ORAL | 4 refills | Status: DC

## 2016-12-08 MED ORDER — METFORMIN HCL 500 MG PO TABS: 500.0000 mg | ORAL_TABLET | Freq: Two times a day (BID) | ORAL | 4 refills | Status: DC

## 2016-12-08 NOTE — Progress Notes (Signed)
BP 130/80 (BP Location: Left Arm, Patient Position: Sitting, Cuff Size: Normal)   Pulse 99   Temp 98.6 F (37 C)   Ht 5\' 5"  (1.651 m)   Wt 190 lb 4 oz (86.3 kg)   SpO2 98%   BMI 31.66 kg/m    Subjective:    Patient ID: Mallory Mcdowell, female    DOB: Mar 28, 1986, 31 y.o.   MRN: [id number]  HPI: Mallory Mcdowell is a 31 y.o. female presenting on 12/08/2016 for Diabetes and Hyperlipidemia   HPI   -Pt didn't go to DM education class.  -She is working at [employer]-  -She ran out of her meds last month and didn't get them refilled.   -Pt didn't call about family planning (needs contraception and has family planning medicaid) -Pt without complaint today -she says she may be moving to [place] but isn't sure yet   Relevant past medical, surgical, family and social history reviewed and updated as indicated. Interim medical history since our last visit reviewed. Allergies and medications reviewed and updated.   Current Outpatient Prescriptions:  .  lovastatin (MEVACOR) 20 MG tablet, Take 1 tablet (20 mg total) by mouth at bedtime. (Patient not taking: Reported on 12/08/2016), Disp: 30 tablet, Rfl: 4 .  metFORMIN (GLUCOPHAGE) 500 MG tablet, Take 1 tablet (500 mg total) by mouth 2 (two) times daily with a meal. Reported on 04/30/2016 (Patient not taking: Reported on 12/08/2016), Disp: 60 tablet, Rfl: 4   Review of Systems  Constitutional: Negative for appetite change, chills, diaphoresis, fatigue, fever and unexpected weight change.  HENT: Negative for congestion, dental problem, drooling, ear pain, facial swelling, hearing loss, mouth sores, sneezing, sore throat, trouble swallowing and voice change.   Eyes: Negative for pain, discharge, redness, itching and visual disturbance.  Respiratory: Negative for cough, choking, shortness of breath and wheezing.   Cardiovascular: Negative for chest pain, palpitations and leg swelling.  Gastrointestinal: Negative for abdominal pain, blood in  stool, constipation, diarrhea and vomiting.  Endocrine: Negative for cold intolerance, heat intolerance and polydipsia.  Genitourinary: Negative for decreased urine volume, dysuria and hematuria.  Musculoskeletal: Negative for arthralgias, back pain and gait problem.  Skin: Negative for rash.  Allergic/Immunologic: Negative for environmental allergies.  Neurological: Negative for seizures, syncope, light-headedness and headaches.  Hematological: Negative for adenopathy.  Psychiatric/Behavioral: Negative for agitation, dysphoric mood and suicidal ideas. The patient is not nervous/anxious.     Per HPI unless specifically indicated above     Objective:    BP 130/80 (BP Location: Left Arm, Patient Position: Sitting, Cuff Size: Normal)   Pulse 99   Temp 98.6 F (37 C)   Ht 5\' 5"  (1.651 m)   Wt 190 lb 4 oz (86.3 kg)   SpO2 98%   BMI 31.66 kg/m   Wt Readings from Last 3 Encounters:  12/08/16 190 lb 4 oz (86.3 kg)  09/07/16 202 lb 9.6 oz (91.9 kg)  09/01/16 175 lb (79.4 kg)    Physical Exam  Constitutional: She is oriented to person, place, and time. She appears well-developed and well-nourished.  HENT:  Head: Normocephalic and atraumatic.  Neck: Neck supple.  Cardiovascular: Normal rate and regular rhythm.   Pulmonary/Chest: Effort normal and breath sounds normal.  Abdominal: Soft. Bowel sounds are normal. She exhibits no mass. There is no hepatosplenomegaly. There is no tenderness.  Musculoskeletal: She exhibits no edema.  Lymphadenopathy:    She has no cervical adenopathy.  Neurological: She is alert and oriented to person,  place, and time.  Skin: Skin is warm and dry.  Psychiatric: She has a normal mood and affect. Her behavior is normal.  Vitals reviewed.   Results for orders placed or performed in visit on 12/01/16  HgB A1c  Result Value Ref Range   Hgb A1c MFr Bld 6.6 (H) <5.7 %   Mean Plasma Glucose 143 mg/dL  Comprehensive metabolic panel  Result Value Ref Range    Sodium 141 135 - 146 mmol/L   Potassium 4.3 3.5 - 5.3 mmol/L   Chloride 108 98 - 110 mmol/L   CO2 24 20 - 31 mmol/L   Glucose, Bld 104 (H) 65 - 99 mg/dL   BUN 8 7 - 25 mg/dL   Creat 0.48 (L) 0.50 - 1.10 mg/dL   Total Bilirubin 0.5 0.2 - 1.2 mg/dL   Alkaline Phosphatase 92 33 - 115 U/L   AST 10 10 - 30 U/L   ALT 14 6 - 29 U/L   Total Protein 7.0 6.1 - 8.1 g/dL   Albumin 4.0 3.6 - 5.1 g/dL   Calcium 9.1 8.6 - 10.2 mg/dL  Lipid Profile  Result Value Ref Range   Cholesterol 179 <200 mg/dL   Triglycerides 64 <150 mg/dL   HDL 33 (L) >50 mg/dL   Total CHOL/HDL Ratio 5.4 (H) <5.0 Ratio   VLDL 13 <30 mg/dL   LDL Cholesterol 133 (H) <100 mg/dL      Assessment & Plan:   Encounter Diagnoses  Name Primary?  [name] Uncontrolled diabetes mellitus type 2 without complications, unspecified long term insulin use status ([place]) Yes  . Hyperlipidemia, unspecified hyperlipidemia type   . Cigarette nicotine dependence without complication   . Class 1 obesity with body mass index (BMI) of 31.0 to 31.9 in adult, unspecified obesity type, unspecified whether serious comorbidity present      -reviewed labs with pt -encouraged pt to Stay on meds -encouraged pt to Go to DM education class -encouraged pt to Call about family planning -follow up 3 months.  RTO sooner prn (since pt may be moving out of county, will  Not order labs to be done prior to appointment)

## 2016-12-17 ENCOUNTER — Encounter: Payer: Self-pay | Admitting: Adult Health

## 2016-12-24 ENCOUNTER — Other Ambulatory Visit (HOSPITAL_COMMUNITY)
Admission: RE | Admit: 2016-12-24 | Discharge: 2016-12-24 | Disposition: A | Discharge status: Home / Self Care | Payer: Medicaid Other | Source: Ambulatory Visit | Attending: Adult Health | Admitting: Adult Health

## 2016-12-24 ENCOUNTER — Ambulatory Visit (INDEPENDENT_AMBULATORY_CARE_PROVIDER_SITE_OTHER): Payer: Medicaid Other | Admitting: Adult Health

## 2016-12-24 ENCOUNTER — Encounter: Payer: Self-pay | Admitting: Adult Health

## 2016-12-24 VITALS — BP 120/80 | HR 84 | Ht 64.4 in | Wt 188.0 lb

## 2016-12-24 DIAGNOSIS — Z308 Encounter for other contraceptive management: Secondary | ICD-10-CM | POA: Diagnosis not present

## 2016-12-24 DIAGNOSIS — Z01419 Encounter for gynecological examination (general) (routine) without abnormal findings: Secondary | ICD-10-CM | POA: Insufficient documentation

## 2016-12-24 DIAGNOSIS — Z30015 Encounter for initial prescription of vaginal ring hormonal contraceptive: Secondary | ICD-10-CM

## 2016-12-24 DIAGNOSIS — Z3009 Encounter for other general counseling and advice on contraception: Secondary | ICD-10-CM

## 2016-12-24 DIAGNOSIS — F32A Depression, unspecified: Secondary | ICD-10-CM

## 2016-12-24 DIAGNOSIS — F329 Major depressive disorder, single episode, unspecified: Secondary | ICD-10-CM

## 2016-12-24 DIAGNOSIS — Z113 Encounter for screening for infections with a predominantly sexual mode of transmission: Secondary | ICD-10-CM | POA: Insufficient documentation

## 2016-12-24 DIAGNOSIS — Z309 Encounter for contraceptive management, unspecified: Principal | ICD-10-CM

## 2016-12-24 LAB — POCT URINE PREGNANCY: PREG TEST UR: NEGATIVE

## 2016-12-24 MED ORDER — ETONOGESTREL-ETHINYL ESTRADIOL 0.12-0.015 MG/24HR VA RING: VAGINAL_RING | VAGINAL | 12 refills | Status: DC

## 2016-12-24 NOTE — Progress Notes (Signed)
Patient ID: Mallory Mcdowell, female   DOB: 01/26/1986, 31 y.o.   MRN: [id number] History of Present Illness:  Mallory Mcdowell is a 31 year old black female in for well woman gyn exam and pap, she has family planning medicaid and was referred by [facility].She wants to try the nuva ring.    Current Medications, Allergies, Past Medical History, Past Surgical History, Family History and Social History were reviewed in [employer] record.     Review of Systems: Patient denies any headaches, hearing loss, fatigue, blurred vision, shortness of breath, chest pain, abdominal pain, problems with bowel movements, urination, or intercourse. No joint pain or mood swings.    Physical Exam:BP 120/80 (BP Location: Left Arm, Patient Position: Sitting)   Pulse 84   Ht 5' 4.4" (1.636 m)   Wt 188 lb (85.3 kg)   LMP 12/19/2016   BMI 31.87 kg/m UPT negative. General:  Well developed, well nourished, no acute distress Skin:  Warm and dry Neck:  Midline trachea, normal thyroid, good ROM, no lymphadenopathy Lungs; Clear to auscultation bilaterally Breast:  No dominant palpable mass, retraction, or nipple discharge Cardiovascular: Regular rate and rhythm Abdomen:  Soft, non tender, no hepatosplenomegaly Pelvic:  External genitalia is normal in appearance, no lesions.  The vagina is normal in appearance. Urethra has no lesions or masses. The cervix is smooth, pap with GC/CHL and HPV performed.  Uterus is felt to be normal size, shape, and contour.  No adnexal masses or tenderness noted.Bladder is non tender, no masses felt. Extremities/musculoskeletal:  No swelling or varicosities noted, no clubbing or cyanosis Psych:  No mood changes, alert and cooperative,seems happy PHQ 9 score 8, denies any suicidal or homicidal ideations and declines meds, but does says is depressed at time. Discussed how to use nuva ring with pt and partner and gave him condoms.  Impression: 1. Encounter for physical  examination, contraception, and Papanicolaou smear   2. Family planning   3. Encounter for initial prescription of vaginal ring hormonal contraceptive   4. Depression, unspecified depression type       Plan: Rx nuva ring #1 with 12 refills, put in for 25 days then replace,can place today  Use condoms  Follow up in 3 months Physical in 1 year Pap in 3 if normal Check HIV and RPR

## 2016-12-24 NOTE — Addendum Note (Signed)
Addended by: Diona Fanti A on: 12/24/2016 04:26 PM   Modules accepted: Orders

## 2016-12-24 NOTE — Patient Instructions (Addendum)
Follow up in 3 months Pap in 3 years if normal Physical in 1 year Use condoms Put ring today

## 2016-12-25 LAB — HIV ANTIBODY (ROUTINE TESTING W REFLEX): HIV Screen 4th Generation wRfx: NONREACTIVE

## 2016-12-25 LAB — RPR: RPR: NONREACTIVE

## 2016-12-29 LAB — CYTOLOGY - PAP
Chlamydia: NEGATIVE
DIAGNOSIS: NEGATIVE
Neisseria Gonorrhea: NEGATIVE

## 2017-02-09 ENCOUNTER — Telehealth: Payer: Self-pay | Admitting: Adult Health

## 2017-02-09 NOTE — Telephone Encounter (Signed)
Spoke with pt. Pt changed her NuvaRing on the 25th day which was Saturday. She was bleeding before she changed it. Pt wonders if she should keep this ring in or change again. I spoke with JAG and she advised to keep current ring in until it's time to change again. Pt mentioned having stomach pain. I advised you can have cramping with period. Advised can try Ibuprofen for cramps. Pt voiced understanding. [place]

## 2017-02-10 ENCOUNTER — Telehealth: Payer: Self-pay | Admitting: Adult Health

## 2017-02-10 MED ORDER — NORETHIN-ETH ESTRAD-FE BIPHAS 1 MG-10 MCG / 10 MCG PO TABS: 1.0000 | ORAL_TABLET | Freq: Every day | ORAL | 11 refills | Status: DC

## 2017-02-10 NOTE — Telephone Encounter (Signed)
Pt called wants to go back on OCs, will rx lo loestrin ,start Sunday, when takes ring out

## 2017-02-12 ENCOUNTER — Emergency Department (HOSPITAL_COMMUNITY)
Admission: EM | Admit: 2017-02-12 | Discharge: 2017-02-13 | Disposition: A | Discharge status: Home / Self Care | Payer: Medicaid Other | Attending: Emergency Medicine | Admitting: Emergency Medicine

## 2017-02-12 ENCOUNTER — Encounter (HOSPITAL_COMMUNITY): Payer: Self-pay | Admitting: *Deleted

## 2017-02-12 DIAGNOSIS — N946 Dysmenorrhea, unspecified: Secondary | ICD-10-CM | POA: Insufficient documentation

## 2017-02-12 DIAGNOSIS — F17210 Nicotine dependence, cigarettes, uncomplicated: Secondary | ICD-10-CM | POA: Insufficient documentation

## 2017-02-12 DIAGNOSIS — E119 Type 2 diabetes mellitus without complications: Secondary | ICD-10-CM | POA: Insufficient documentation

## 2017-02-12 DIAGNOSIS — Z7984 Long term (current) use of oral hypoglycemic drugs: Secondary | ICD-10-CM | POA: Insufficient documentation

## 2017-02-12 DIAGNOSIS — I10 Essential (primary) hypertension: Secondary | ICD-10-CM | POA: Insufficient documentation

## 2017-02-12 LAB — URINALYSIS, ROUTINE W REFLEX MICROSCOPIC
Bacteria, UA: NONE SEEN
Bilirubin Urine: NEGATIVE
GLUCOSE, UA: NEGATIVE mg/dL
KETONES UR: NEGATIVE mg/dL
LEUKOCYTES UA: NEGATIVE
NITRITE: NEGATIVE
PROTEIN: NEGATIVE mg/dL
Specific Gravity, Urine: 1.028 (ref 1.005–1.030)
pH: 5 (ref 5.0–8.0)

## 2017-02-12 LAB — PREGNANCY, URINE: Preg Test, Ur: NEGATIVE

## 2017-02-12 MED ORDER — KETOROLAC TROMETHAMINE 60 MG/2ML IM SOLN
60.0000 mg | Freq: Once | INTRAMUSCULAR | Status: AC
  Administered 2017-02-13: 60 mg via INTRAMUSCULAR
  Filled 2017-02-12: qty 2

## 2017-02-12 NOTE — ED Triage Notes (Signed)
Pt reports having lower abdominal and pelvic pain x 1 week.  Pt states recently went off the nuvaring and has been experiencing pain.

## 2017-02-13 LAB — COMPREHENSIVE METABOLIC PANEL
ALK PHOS: 78 U/L (ref 38–126)
ALT: 14 U/L (ref 14–54)
ANION GAP: 10 (ref 5–15)
AST: 14 U/L — AB (ref 15–41)
Albumin: 3.3 g/dL — ABNORMAL LOW (ref 3.5–5.0)
BILIRUBIN TOTAL: 0.2 mg/dL — AB (ref 0.3–1.2)
BUN: 9 mg/dL (ref 6–20)
CO2: 23 mmol/L (ref 22–32)
Calcium: 8.9 mg/dL (ref 8.9–10.3)
Chloride: 105 mmol/L (ref 101–111)
Creatinine, Ser: 0.54 mg/dL (ref 0.44–1.00)
GFR calc Af Amer: >60 mL/min (ref >60)
GFR calc non Af Amer: >60 mL/min (ref >60)
GLUCOSE: 140 mg/dL — AB (ref 65–99)
Potassium: 3.5 mmol/L (ref 3.5–5.1)
SODIUM: 138 mmol/L (ref 135–145)
Total Protein: 7.3 g/dL (ref 6.5–8.1)

## 2017-02-13 LAB — WET PREP, GENITAL
SPERM: NONE SEEN
TRICH WET PREP: NONE SEEN
YEAST WET PREP: NONE SEEN

## 2017-02-13 LAB — CBC
HEMATOCRIT: 35.1 % — AB (ref 36.0–46.0)
HEMOGLOBIN: 11.7 g/dL — AB (ref 12.0–15.0)
MCH: 27.9 pg (ref 26.0–34.0)
MCHC: 33.3 g/dL (ref 30.0–36.0)
MCV: 83.6 fL (ref 78.0–100.0)
Platelets: 418 10*3/uL — ABNORMAL HIGH (ref 150–400)
RBC: 4.2 MIL/uL (ref 3.87–5.11)
RDW: 12.9 % (ref 11.5–15.5)
WBC: 12.9 10*3/uL — AB (ref 4.0–10.5)

## 2017-02-13 LAB — LIPASE, BLOOD: Lipase: 21 U/L (ref 11–51)

## 2017-02-13 MED ORDER — HYDROCODONE-ACETAMINOPHEN 5-325 MG PO TABS
1.0000 | ORAL_TABLET | Freq: Once | ORAL | Status: AC
  Administered 2017-02-13: 1 via ORAL
  Filled 2017-02-13: qty 1

## 2017-02-13 MED ORDER — HYDROCODONE-ACETAMINOPHEN 5-325 MG PO TABS: 1.0000 | ORAL_TABLET | ORAL | 0 refills | Status: DC | PRN

## 2017-02-13 MED ORDER — NAPROXEN 500 MG PO TABS: 500.0000 mg | ORAL_TABLET | Freq: Two times a day (BID) | ORAL | 0 refills | Status: DC | PRN

## 2017-02-13 NOTE — ED Provider Notes (Signed)
[place] DEPT Provider Note   CSN: [id number] Arrival date & time: 02/12/17  2157     History   Chief Complaint Chief Complaint  Patient presents with  . Abdominal Pain    HPI Mallory Mcdowell is a 31 y.o. female with history of DM, HTN and prior known ovarian cyst and uterine fibroid per [place] completed 12/16 presenting with low pelvic pain, described as severe pressure which she feels is related to the nuvaring she was started on within the past month.  She took the ring out 2 days ago secondary to pain but has been bleeding heavy including passage of clots for the past week.  She denies pregnancy, denies n/v , dysuria and vaginal discharge.   The history is provided by the patient.    Past Medical History:  Diagnosis Date  . Diabetes mellitus without complication ([place])   . Hypertension     Patient Active Problem List   Diagnosis Date Noted  . Encounter for physical examination, contraception, and Papanicolaou smear 12/24/2016  . Family planning 12/24/2016  . Cigarette nicotine dependence without complication 00/86/7619  . Uncontrolled diabetes mellitus type 2 without complications ([place]) 50/93/2671  . Elevated platelet count ([place]) 05/01/2016  . Alcohol abuse 05/01/2016    History reviewed. No pertinent surgical history.  OB History    Gravida Para Term Preterm AB Living   0 0 0 0 0 0   SAB TAB Ectopic Multiple Live Births   0 0 0 0         Home Medications    Prior to Admission medications   Medication Sig Start Date End Date Taking? Authorizing Provider  metFORMIN (GLUCOPHAGE) 500 MG tablet Take 1 tablet (500 mg total) by mouth 2 (two) times daily with a meal. Reported on 04/30/2016 12/08/16  Yes Soyla Dryer, PA-C  HYDROcodone-acetaminophen (NORCO/VICODIN) 5-325 MG tablet Take 1 tablet by mouth every 4 (four) hours as needed. 02/13/17   Evalee Jefferson, PA-C  lovastatin (MEVACOR) 20 MG tablet Take 1 tablet (20 mg total) by mouth at bedtime. Patient not taking:  Reported on 02/12/2017 12/08/16   Soyla Dryer, PA-C  naproxen (NAPROSYN) 500 MG tablet Take 1 tablet (500 mg total) by mouth 2 (two) times daily as needed for moderate pain. 02/13/17   Evalee Jefferson, PA-C  Norethindrone-Ethinyl Estradiol-Fe Biphas (LO LOESTRIN FE) 1 MG-10 MCG / 10 MCG tablet Take 1 tablet by mouth daily. Take 1 daily by mouth 02/10/17   Estill Dooms, NP    Family History Family History  Problem Relation Age of Onset  . Asthma Mother   . Diabetes Other   . Heart failure Other   . Cancer Other     Social History Social History  Substance Use Topics  . Smoking status: Current Every Day Smoker    Packs/day: 0.50    Types: Cigarettes  . Smokeless tobacco: Never Used  . Alcohol use 0.6 oz/week    1 Cans of beer per week     Comment: drinks 8% beers, liquor on the weekends- 1 pt/day- hx alcoholism     Allergies   Patient has no known allergies.   Review of Systems Review of Systems  Constitutional: Negative for fever.  HENT: Negative for congestion and sore throat.   Eyes: Negative.   Respiratory: Negative for chest tightness and shortness of breath.   Cardiovascular: Negative for chest pain.  Gastrointestinal: Negative for abdominal pain, nausea and vomiting.  Genitourinary: Positive for pelvic pain. Negative for dysuria,  flank pain, vaginal discharge and vaginal pain.  Musculoskeletal: Negative for arthralgias, joint swelling and neck pain.  Skin: Negative.  Negative for rash and wound.  Neurological: Negative for dizziness, weakness, light-headedness, numbness and headaches.  Psychiatric/Behavioral: Negative.      Physical Exam Updated Vital Signs BP (!) 155/91 (BP Location: Right Arm)   Pulse (!) 110   Temp 98 F (36.7 C) (Oral)   Resp 20   Ht 5\' 5"  (1.651 m)   Wt 87.1 kg   LMP 02/05/2017   SpO2 100%   BMI 31.95 kg/m   Physical Exam  Constitutional: She appears well-developed and well-nourished.  HENT:  Head: Normocephalic and atraumatic.    Eyes: Conjunctivae are normal.  Neck: Normal range of motion.  Cardiovascular: Normal rate, regular rhythm, normal heart sounds and intact distal pulses.   Pulmonary/Chest: Effort normal and breath sounds normal. She has no wheezes.  Abdominal: Soft. Bowel sounds are normal. There is no tenderness.  Genitourinary: Uterus is tender. Uterus is not enlarged. Cervix exhibits no motion tenderness and no discharge. Right adnexum displays no mass, no tenderness and no fullness. Left adnexum displays no mass, no tenderness and no fullness. There is bleeding in the vagina. No erythema in the vagina.  Genitourinary Comments: Moderate blood in vaginal vault  Musculoskeletal: Normal range of motion.  Neurological: She is alert.  Skin: Skin is warm and dry.  Psychiatric: She has a normal mood and affect.  Nursing note and vitals reviewed.    ED Treatments / Results  Labs (all labs ordered are listed, but only abnormal results are displayed) Labs Reviewed  WET PREP, GENITAL - Abnormal; Notable for the following:       Result Value   WBC, Wet Prep HPF POC FEW (*)    All other components within normal limits  COMPREHENSIVE METABOLIC PANEL - Abnormal; Notable for the following:    Glucose, Bld 140 (*)    Albumin 3.3 (*)    AST 14 (*)    Total Bilirubin 0.2 (*)    All other components within normal limits  CBC - Abnormal; Notable for the following:    WBC 12.9 (*)    Hemoglobin 11.7 (*)    HCT 35.1 (*)    Platelets 418 (*)    All other components within normal limits  URINALYSIS, ROUTINE W REFLEX MICROSCOPIC - Abnormal; Notable for the following:    Hgb urine dipstick LARGE (*)    Squamous Epithelial / LPF 0-5 (*)    All other components within normal limits  LIPASE, BLOOD  PREGNANCY, URINE  GC/CHLAMYDIA PROBE AMP ([place]) NOT AT [facility]    EKG  EKG Interpretation None       Radiology No results found.  Procedures Procedures (including critical care time)  Medications  Ordered in ED Medications  ketorolac (TORADOL) injection 60 mg (60 mg Intramuscular Given 02/13/17 0010)  HYDROcodone-acetaminophen (NORCO/VICODIN) 5-325 MG per tablet 1 tablet (1 tablet Oral Given 02/13/17 0151)     Initial Impression / Assessment and Plan / ED Course  I have reviewed the triage vital signs and the nursing notes.  Pertinent labs & imaging results that were available during my care of the patient were reviewed by me and considered in my medical decision making (see chart for details).     Pt with stable VS, no acute finding with labs.  Exam and hx c/w dysmenorrhea.  Naproxen, hydrocodone if needed for severe pain, heat .  f/u with [facility]  prn.   [place] controlled substance database reviewed.   Final Clinical Impressions(s) / ED Diagnoses   Final diagnoses:  Dysmenorrhea    New Prescriptions New Prescriptions   HYDROCODONE-ACETAMINOPHEN (NORCO/VICODIN) 5-325 MG TABLET    Take 1 tablet by mouth every 4 (four) hours as needed.   NAPROXEN (NAPROSYN) 500 MG TABLET    Take 1 tablet (500 mg total) by mouth 2 (two) times daily as needed for moderate pain.     Evalee Jefferson, PA-C 02/13/17 3888    Rolland Porter, MD 02/13/17 [phone]

## 2017-02-13 NOTE — Discharge Instructions (Signed)
Your labs and exam are reassuring that there is no medical emergency tonight.  I suspect your pelvic pain is related to your heavy period.  Use the medicines prescribed including naproxen, and hydrocodone if needed for severe pain relief.  This will make you drowsy - do not drive within 4 hours of taking this medication.  A heating pad applied to your abdomen can sometimes help ease the pain of a heavy period.

## 2017-02-15 LAB — GC/CHLAMYDIA PROBE AMP (~~LOC~~) NOT AT ARMC
Chlamydia: NEGATIVE
Neisseria Gonorrhea: NEGATIVE

## 2017-02-16 ENCOUNTER — Telehealth: Payer: Self-pay | Admitting: *Deleted

## 2017-02-16 NOTE — Telephone Encounter (Signed)
Patient called stating she is continuing to have heavy bleeding since taking our her Nuvaring and starting Lo Loestrin.  She was seen at [facility] for dysmenorrhea and abdominal pain and was prescribed hydrocodone and is still complaining of pelvic pain 7/10. She has a known history of ovarian cysts and fibroids but no recent U/S. Does she need to be seen or continue through 2 packs of BCP's to regulate bleeding? Please advise.

## 2017-02-17 ENCOUNTER — Other Ambulatory Visit: Payer: Self-pay | Admitting: Advanced Practice Midwife

## 2017-02-17 MED ORDER — NORGESTIMATE-ETH ESTRADIOL 0.25-35 MG-MCG PO TABS: 1.0000 | ORAL_TABLET | Freq: Every day | ORAL | 1 refills | Status: DC

## 2017-02-17 NOTE — Telephone Encounter (Signed)
I will temporarily incr her estrogen for 2 months (pick up a new pack of piillls) then go back to LoLo .

## 2017-02-17 NOTE — Telephone Encounter (Signed)
LMOVM that Mallory Mcdowell temporarily increased her estrogen for 2 months (pick up a new pack of pills) then go back to LoLo after that pack is complete.

## 2017-03-08 ENCOUNTER — Ambulatory Visit: Payer: Medicaid Other | Admitting: Physician Assistant

## 2017-03-11 ENCOUNTER — Encounter: Payer: Self-pay | Admitting: Physician Assistant

## 2017-03-23 ENCOUNTER — Ambulatory Visit: Payer: Medicaid Other | Admitting: Adult Health

## 2017-04-15 ENCOUNTER — Ambulatory Visit: Payer: Medicaid Other | Admitting: Adult Health

## 2017-05-05 ENCOUNTER — Emergency Department (HOSPITAL_COMMUNITY): Payer: No Typology Code available for payment source

## 2017-05-05 ENCOUNTER — Encounter (HOSPITAL_COMMUNITY): Payer: Self-pay

## 2017-05-05 ENCOUNTER — Emergency Department (HOSPITAL_COMMUNITY)
Admission: EM | Admit: 2017-05-05 | Discharge: 2017-05-05 | Disposition: A | Discharge status: Home / Self Care | Payer: No Typology Code available for payment source | Attending: Emergency Medicine | Admitting: Emergency Medicine

## 2017-05-05 DIAGNOSIS — S20212A Contusion of left front wall of thorax, initial encounter: Secondary | ICD-10-CM | POA: Diagnosis not present

## 2017-05-05 DIAGNOSIS — F17210 Nicotine dependence, cigarettes, uncomplicated: Secondary | ICD-10-CM | POA: Insufficient documentation

## 2017-05-05 DIAGNOSIS — S299XXA Unspecified injury of thorax, initial encounter: Secondary | ICD-10-CM | POA: Diagnosis present

## 2017-05-05 DIAGNOSIS — Z7984 Long term (current) use of oral hypoglycemic drugs: Secondary | ICD-10-CM | POA: Insufficient documentation

## 2017-05-05 DIAGNOSIS — Y92481 Parking lot as the place of occurrence of the external cause: Secondary | ICD-10-CM | POA: Diagnosis not present

## 2017-05-05 DIAGNOSIS — Y9389 Activity, other specified: Secondary | ICD-10-CM | POA: Diagnosis not present

## 2017-05-05 DIAGNOSIS — E119 Type 2 diabetes mellitus without complications: Secondary | ICD-10-CM | POA: Insufficient documentation

## 2017-05-05 DIAGNOSIS — Y999 Unspecified external cause status: Secondary | ICD-10-CM | POA: Insufficient documentation

## 2017-05-05 DIAGNOSIS — I10 Essential (primary) hypertension: Secondary | ICD-10-CM | POA: Insufficient documentation

## 2017-05-05 DIAGNOSIS — T07XXXA Unspecified multiple injuries, initial encounter: Secondary | ICD-10-CM

## 2017-05-05 DIAGNOSIS — Z79899 Other long term (current) drug therapy: Secondary | ICD-10-CM | POA: Insufficient documentation

## 2017-05-05 MED ORDER — METHOCARBAMOL 500 MG PO TABS
1000.0000 mg | ORAL_TABLET | Freq: Once | ORAL | Status: AC
Start: 2017-05-05 — End: 2017-05-05
  Administered 2017-05-05: 1000 mg via ORAL
  Filled 2017-05-05: qty 2

## 2017-05-05 MED ORDER — ACETAMINOPHEN 500 MG PO TABS
1000.0000 mg | ORAL_TABLET | Freq: Once | ORAL | Status: AC
  Administered 2017-05-05: 1000 mg via ORAL
  Filled 2017-05-05: qty 2

## 2017-05-05 MED ORDER — METHOCARBAMOL 500 MG PO TABS: 500.0000 mg | ORAL_TABLET | Freq: Three times a day (TID) | ORAL | 0 refills | Status: DC
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
## 2017-05-05 NOTE — Discharge Instructions (Signed)
Your vital signs have been reviewed. Your oxygen level is 99% on room air. The chest x-ray is negative for acute problem. The rib detail x-ray is also negative for acute fracture or dislocation. The back, shoulder, and arm pain suggests musculoskeletal discomfort. Please use Robaxin for spasm type pain, use Tylenol Extra Strength for generalized pain. Please see Mr. McElroy for additional evaluation and management if not improving.

## 2017-05-05 NOTE — ED Notes (Signed)
Pt to xray
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
## 2017-05-05 NOTE — ED Notes (Signed)
Urine POC Pregnancy Result Negative

## 2017-05-15 IMAGING — DX DG CHEST 2V
2 series · 2 of 2 positions shown · non-contrast
Comparison: Chest radiograph performed 03/26/2015

CLINICAL DATA: Acute onset of productive cough and left sided
abdominal pain. Abdominal swelling and vomiting. Initial encounter.

EXAM:
CHEST  2 VIEW

[chest pa]
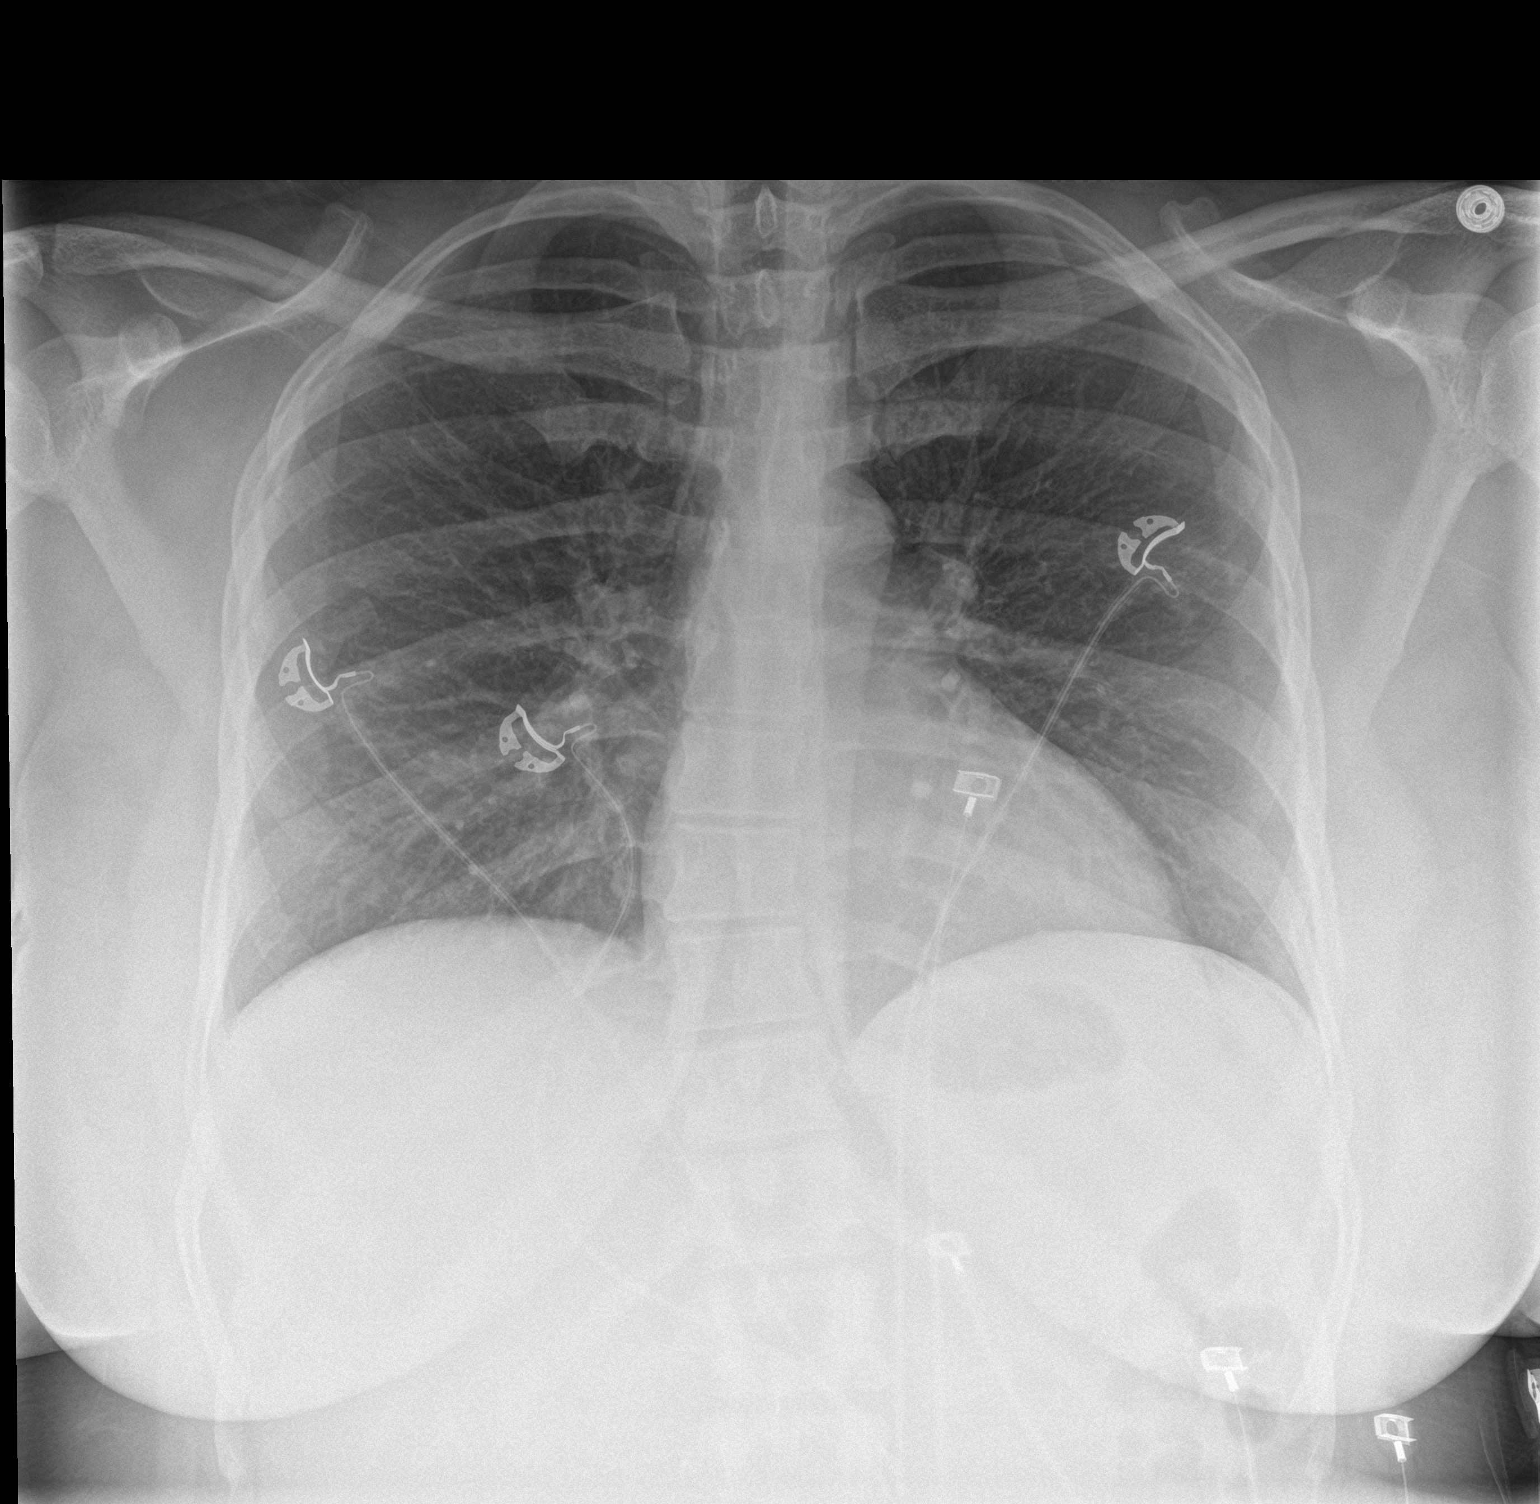

[chest lat]
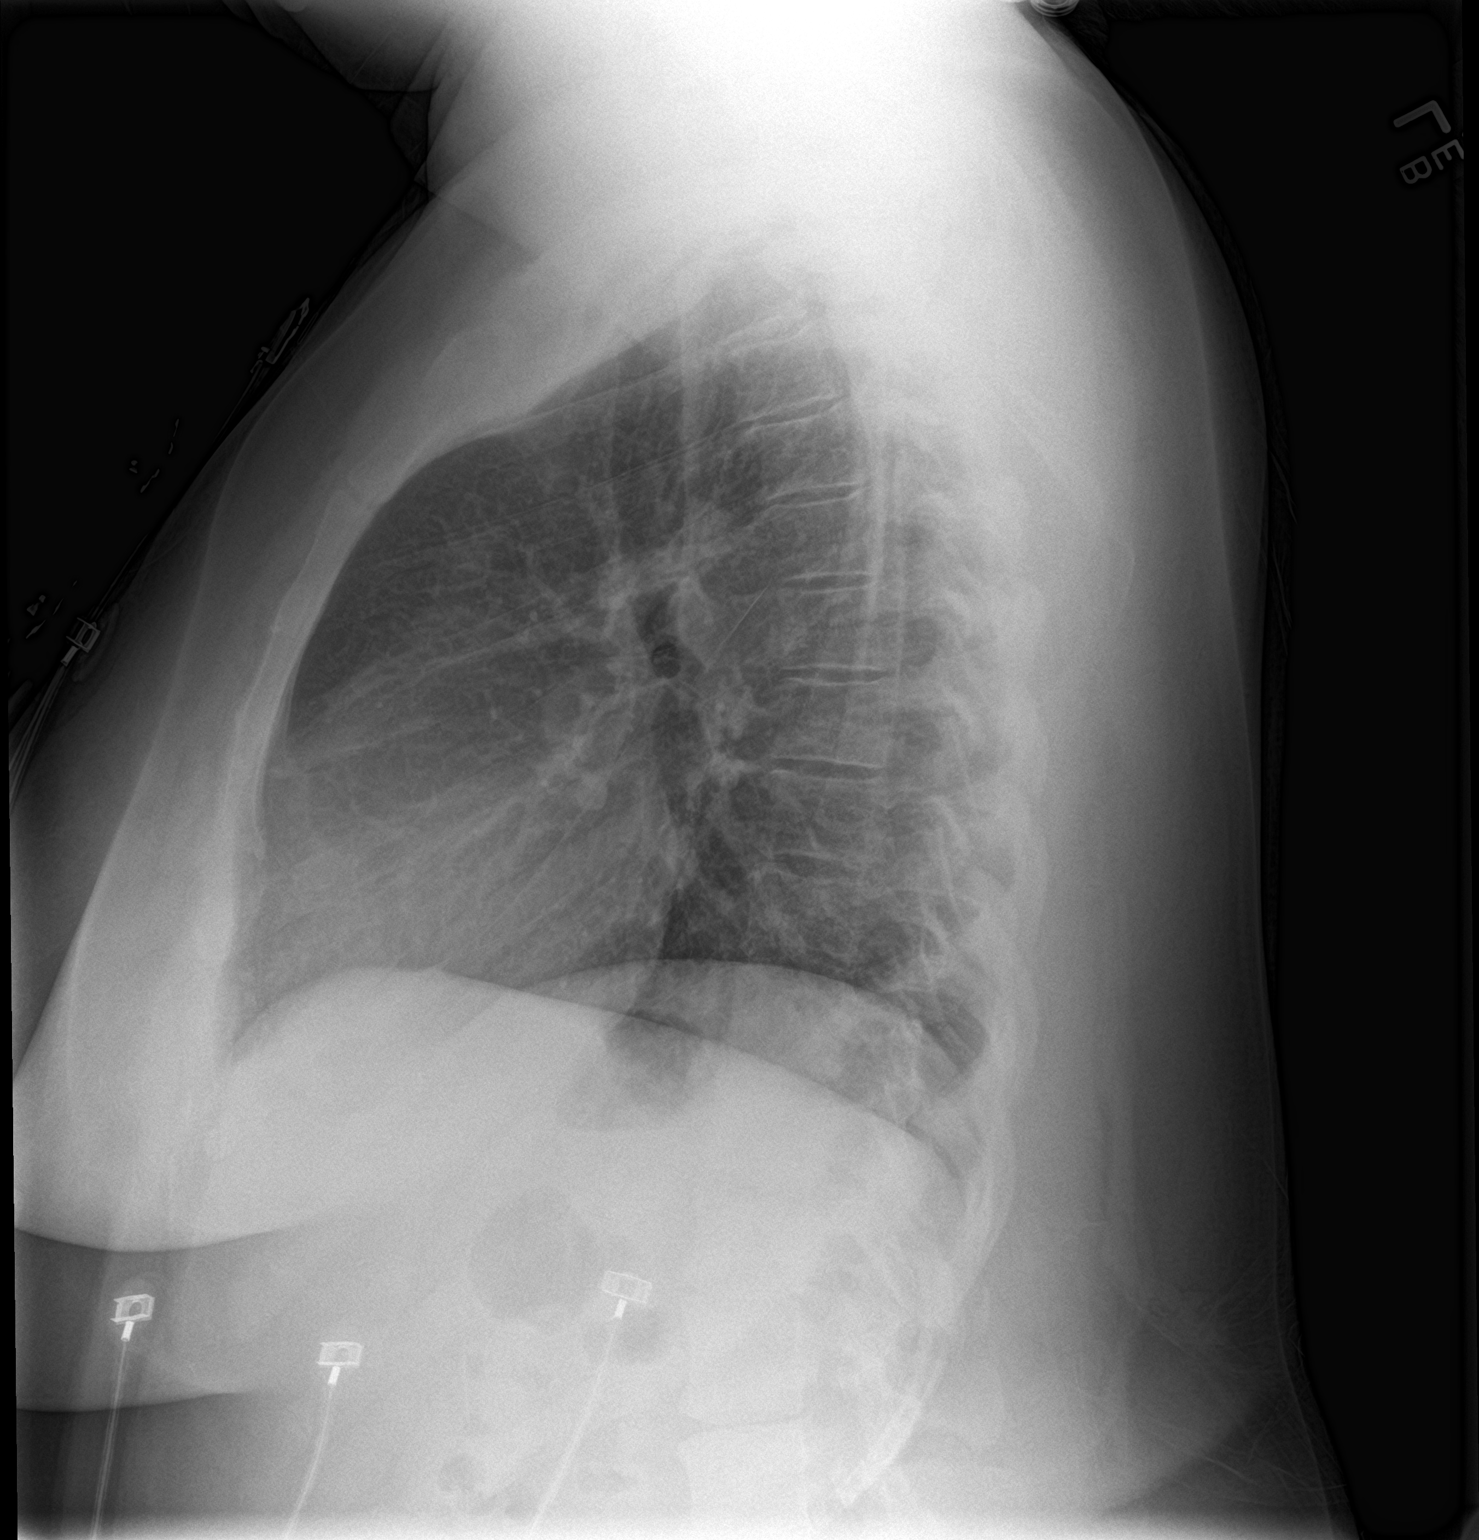

[2 of 2 positions shown; findings below may reference images not displayed]

FINDINGS: The lungs are well-aerated and clear. There is no evidence of focal
opacification, pleural effusion or pneumothorax.

The heart is normal in size; the mediastinal contour is within
normal limits. No acute osseous abnormalities are seen.
IMPRESSION: No acute cardiopulmonary process seen.

## 2017-06-14 IMAGING — US US TRANSVAGINAL NON-OB
1 series · 13 of 25 positions shown · non-contrast
Comparison: Ultrasound dated 04/12/2014

CLINICAL DATA: 29-year-old female with left pelvic pain.

EXAM:
TRANSABDOMINAL AND TRANSVAGINAL ULTRASOUND OF PELVIS
TECHNIQUE: Both transabdominal and transvaginal ultrasound examinations of the
pelvis were performed. Transabdominal technique was performed for
global imaging of the pelvis including uterus, ovaries, adnexal
regions, and pelvic cul-de-sac. It was necessary to proceed with
endovaginal exam following the transabdominal exam to visualize the
endometrium and the ovaries.

[Series 1: us transvaginal non-ob · 0.23mm/px · 13 of 37 slices shown]
[im 1/37]
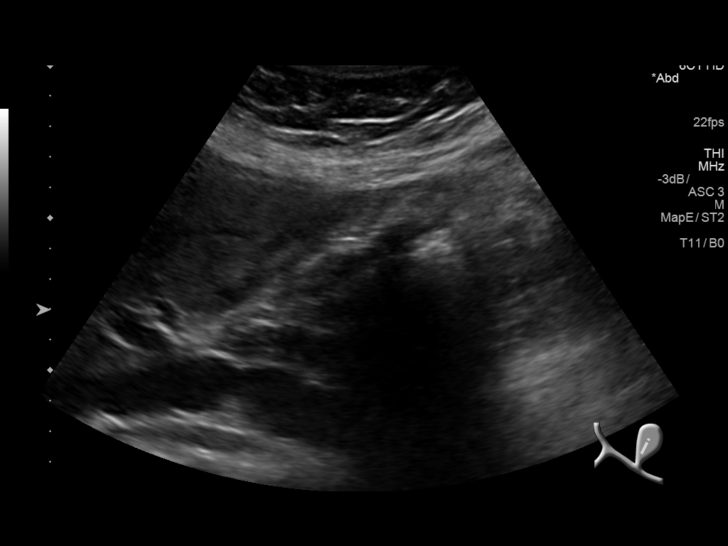
[im 4/37]
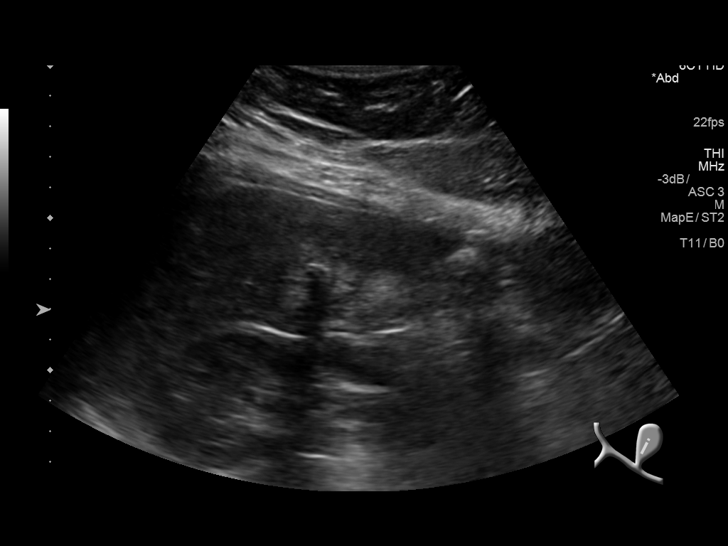
[im 7/37]
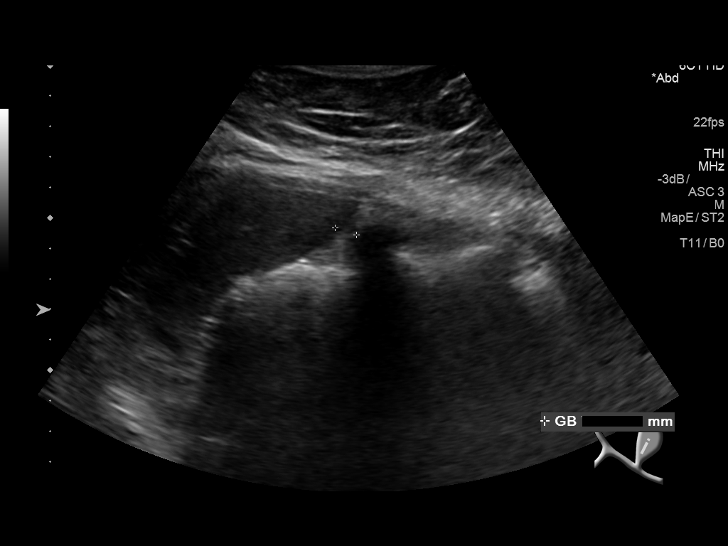
[im 10/37]
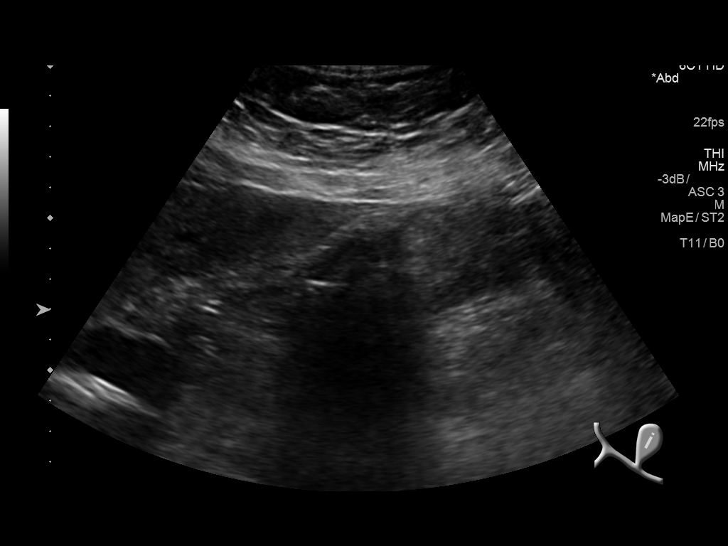
[im 13/37]
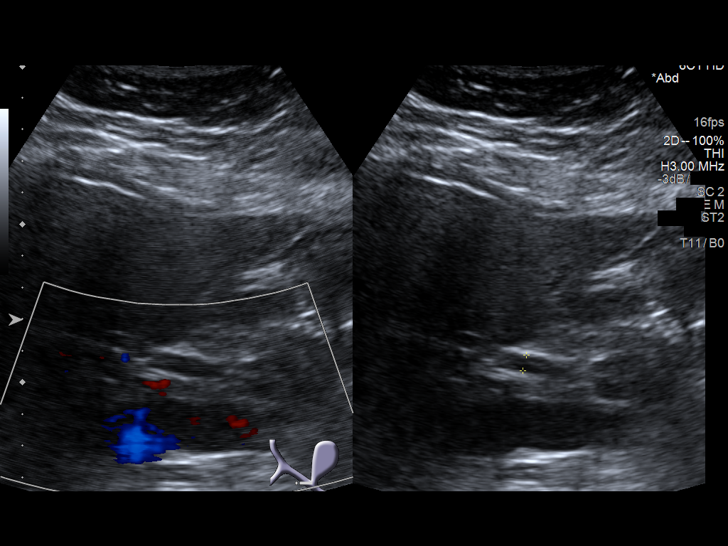
[im 16/37]
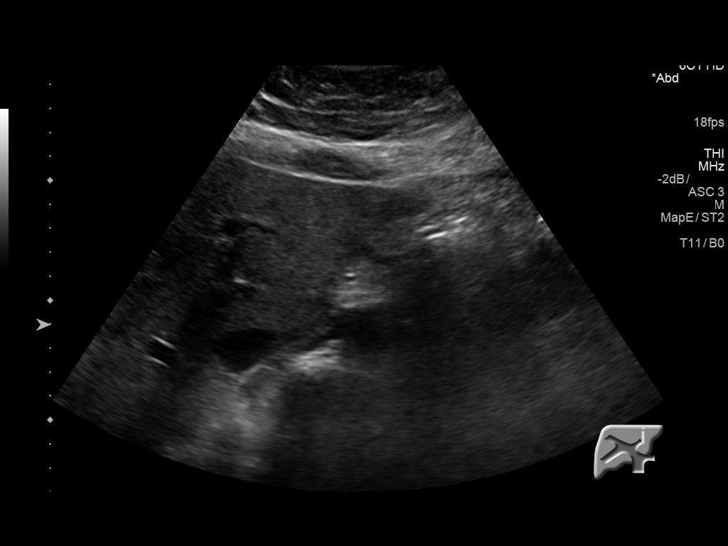
[im 19/37]
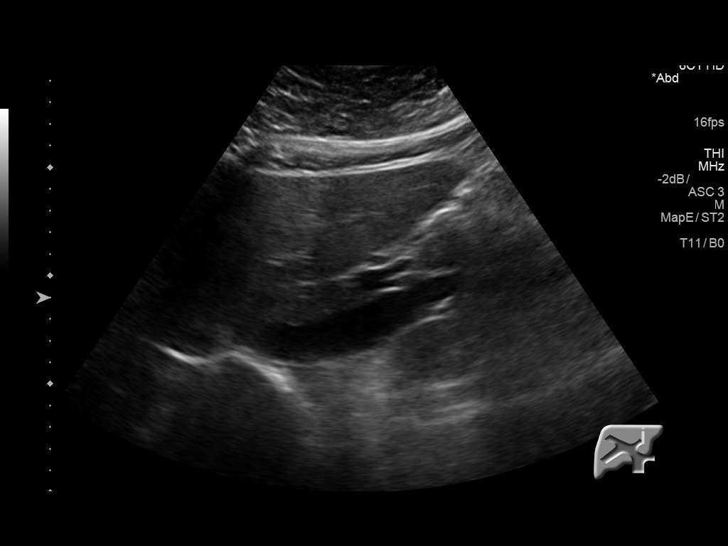
[im 22/37]
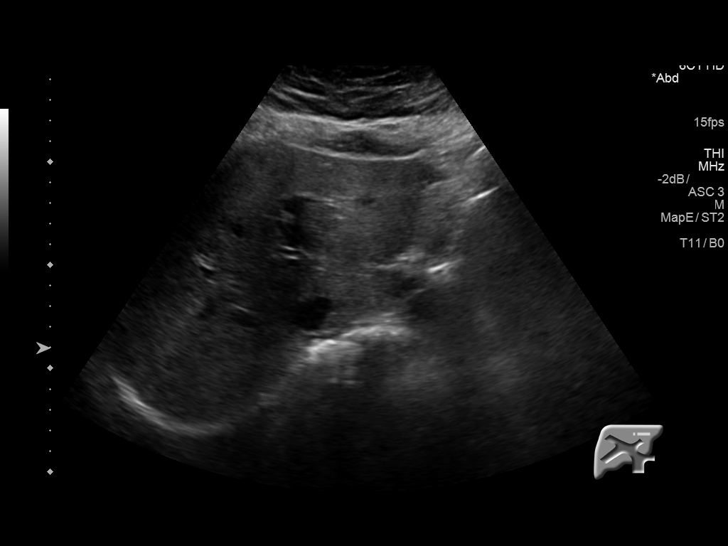
[im 25/37]
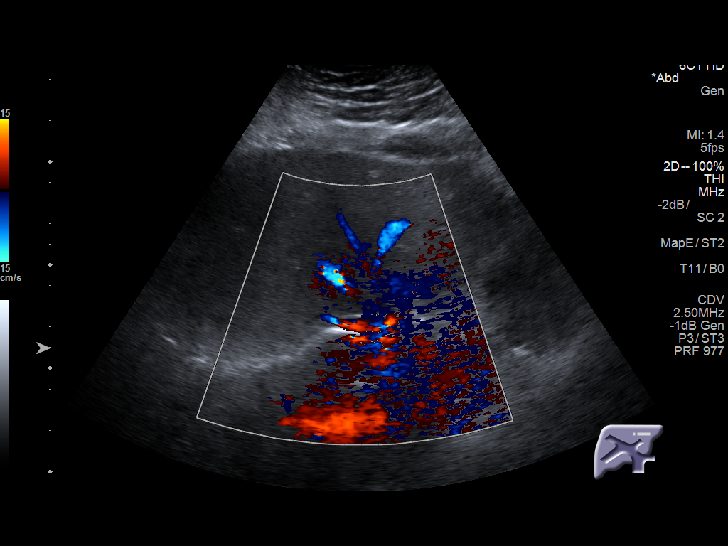
[im 28/37]
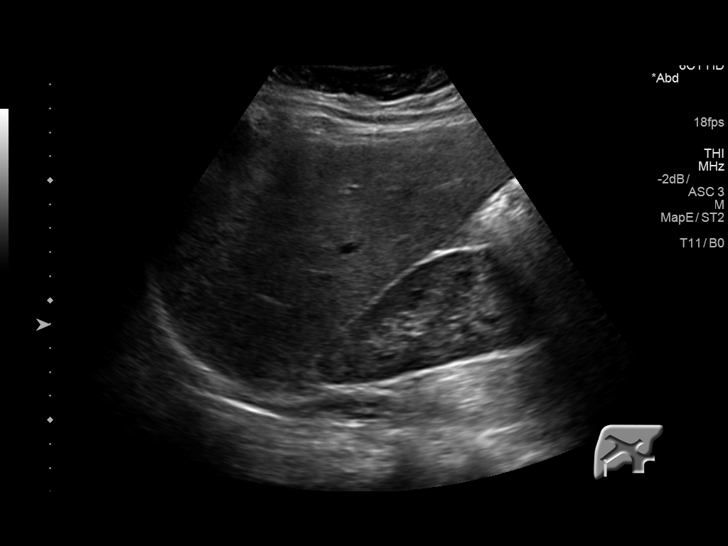
[im 31/37]
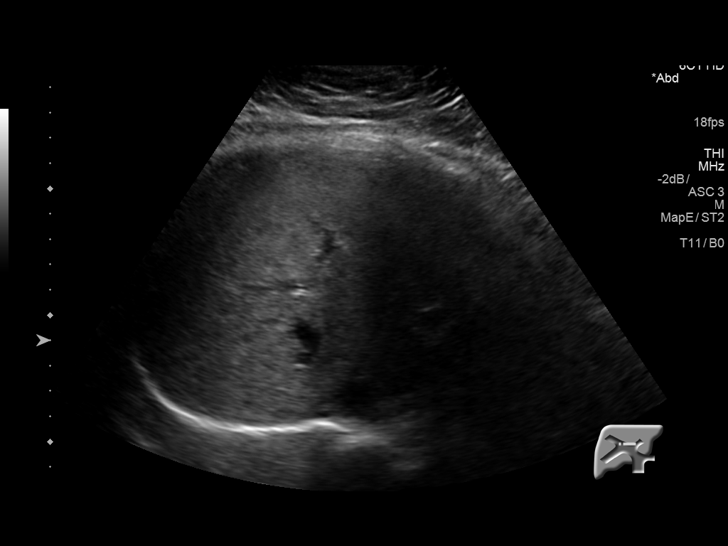
[im 34/37]
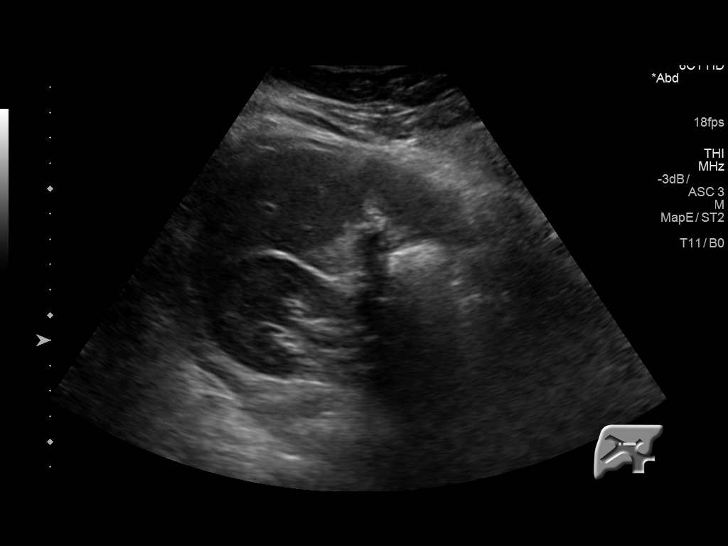
[im 37/37]
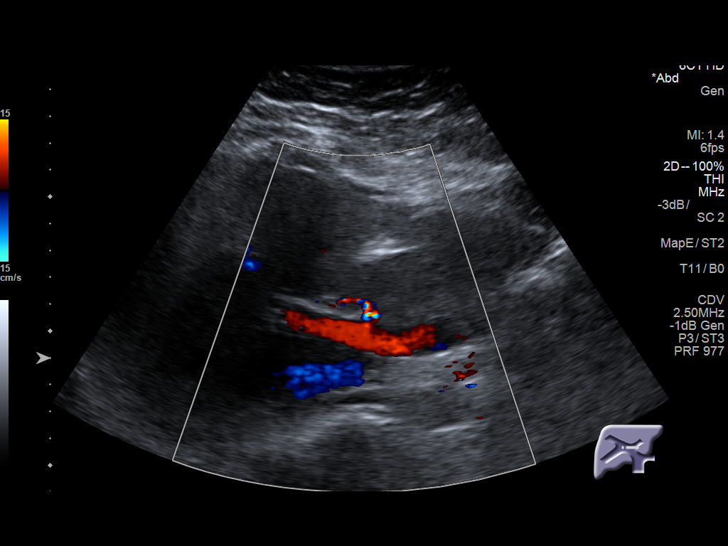

[13 of 25 positions shown; findings below may reference images not displayed]

FINDINGS: Uterus

Measurements: 9.1 x 5.0 x 10.3 cm. There is a 7.6 x 5.3 x 8.7 cm
(previously 6.1 x 5.8 by 5.0 cm) fibroid in the right posterior
lateral aspect of the uterine body. The interval change in size of
the fibroid may be partially related to difference in measurement
technique. There is mild diffuse increased myometrial vascularity.
There is small amount of fluid within the endocervical canal. Stop

Endometrium

Thickness: 13 mm.  No focal abnormality visualized.

Right ovary

Measurements: 2.6 x 2.2 x 3.8 cm. Normal appearance/no adnexal mass.

Left ovary

Measurements: 4.7 x 1.9 x 3.2 cm. There is a 1.2 x 1.0 cm complex
isoechoic structure within the left ovary likely representing a
corpus luteum. Clinical correlation and follow-up after 2 menstrual
cycle recommended..

Other findings

No free fluid.
IMPRESSION: Uterine fibroid.

Left ovarian probable corpus luteum.

## 2017-06-25 ENCOUNTER — Encounter (HOSPITAL_COMMUNITY): Payer: Self-pay | Admitting: Emergency Medicine

## 2017-06-25 ENCOUNTER — Emergency Department (HOSPITAL_COMMUNITY)
Admission: EM | Admit: 2017-06-25 | Discharge: 2017-06-25 | Disposition: A | Discharge status: Home / Self Care | Payer: Medicaid Other | Attending: Emergency Medicine | Admitting: Emergency Medicine

## 2017-06-25 DIAGNOSIS — Z79899 Other long term (current) drug therapy: Secondary | ICD-10-CM | POA: Insufficient documentation

## 2017-06-25 DIAGNOSIS — I10 Essential (primary) hypertension: Secondary | ICD-10-CM | POA: Insufficient documentation

## 2017-06-25 DIAGNOSIS — E119 Type 2 diabetes mellitus without complications: Secondary | ICD-10-CM | POA: Insufficient documentation

## 2017-06-25 DIAGNOSIS — F17210 Nicotine dependence, cigarettes, uncomplicated: Secondary | ICD-10-CM | POA: Insufficient documentation

## 2017-06-25 DIAGNOSIS — Z7984 Long term (current) use of oral hypoglycemic drugs: Secondary | ICD-10-CM | POA: Insufficient documentation

## 2017-06-25 DIAGNOSIS — N898 Other specified noninflammatory disorders of vagina: Secondary | ICD-10-CM | POA: Insufficient documentation

## 2017-06-25 LAB — URINALYSIS, ROUTINE W REFLEX MICROSCOPIC
BILIRUBIN URINE: NEGATIVE
Glucose, UA: NEGATIVE mg/dL
HGB URINE DIPSTICK: NEGATIVE
Ketones, ur: NEGATIVE mg/dL
Leukocytes, UA: NEGATIVE
Nitrite: NEGATIVE
PH: 5 (ref 5.0–8.0)
Protein, ur: NEGATIVE mg/dL
SPECIFIC GRAVITY, URINE: 1.018 (ref 1.005–1.030)

## 2017-06-25 LAB — WET PREP, GENITAL
Sperm: NONE SEEN
Trich, Wet Prep: NONE SEEN
Yeast Wet Prep HPF POC: NONE SEEN

## 2017-06-25 LAB — PREGNANCY, URINE: Preg Test, Ur: NEGATIVE

## 2017-06-25 MED ORDER — AZITHROMYCIN 250 MG PO TABS
1000.0000 mg | ORAL_TABLET | Freq: Once | ORAL | Status: AC
  Administered 2017-06-25: 1000 mg via ORAL
  Filled 2017-06-25: qty 4

## 2017-06-25 MED ORDER — LIDOCAINE HCL (PF) 1 % IJ SOLN
INTRAMUSCULAR | Status: AC
  Administered 2017-06-25: 5 mL
  Filled 2017-06-25: qty 5

## 2017-06-25 MED ORDER — METRONIDAZOLE 500 MG PO TABS: 500.0000 mg | ORAL_TABLET | Freq: Two times a day (BID) | ORAL | 0 refills | Status: DC

## 2017-06-25 MED ORDER — CEFTRIAXONE SODIUM 250 MG IJ SOLR
250.0000 mg | Freq: Once | INTRAMUSCULAR | Status: AC
  Administered 2017-06-25: 250 mg via INTRAMUSCULAR
  Filled 2017-06-25: qty 250

## 2017-06-25 NOTE — ED Provider Notes (Signed)
[facility] DEPT Provider Note   CSN: [id number] Arrival date & time: 06/25/17  1853     History   Chief Complaint Chief Complaint  Patient presents with  . Vaginal Discharge    HPI Mallory Mcdowell is a 31 y.o. female.  HPI   Mallory Mcdowell is a 31 y.o. female who presents to the Emergency Department complaining of green to yellow vaginal discharge that she noticed earlier today.  Also complains of an odor.  She admits to unprotected sex with her long term partner.  Denies fever, dysuria, abdominal pain, abnormal vaginal bleeding, genital rash and back pain.  Nothing makes her symptoms better or worse.    Past Medical History:  Diagnosis Date  . Diabetes mellitus without complication ([place])   . Hypertension     Patient Active Problem List   Diagnosis Date Noted  . Encounter for physical examination, contraception, and Papanicolaou smear 12/24/2016  . Family planning 12/24/2016  . Cigarette nicotine dependence without complication 20/25/4270  . Uncontrolled diabetes mellitus type 2 without complications ([place]) 62/37/6283  . Elevated platelet count 05/01/2016  . Alcohol abuse 05/01/2016    History reviewed. No pertinent surgical history.  OB History    Gravida Para Term Preterm AB Living   0 0 0 0 0 0   SAB TAB Ectopic Multiple Live Births   0 0 0 0         Home Medications    Prior to Admission medications   Medication Sig Start Date End Date Taking? Authorizing Provider  HYDROcodone-acetaminophen (NORCO/VICODIN) 5-325 MG tablet Take 1 tablet by mouth every 4 (four) hours as needed. 02/13/17   Evalee Jefferson, PA-C  lovastatin (MEVACOR) 20 MG tablet Take 1 tablet (20 mg total) by mouth at bedtime. Patient not taking: Reported on 02/12/2017 12/08/16   Soyla Dryer, PA-C  metFORMIN (GLUCOPHAGE) 500 MG tablet Take 1 tablet (500 mg total) by mouth 2 (two) times daily with a meal. Reported on 04/30/2016 12/08/16   Soyla Dryer, PA-C  methocarbamol (ROBAXIN) 500  MG tablet Take 1 tablet (500 mg total) by mouth 3 (three) times daily. 05/05/17   Lily Kocher, PA-C  naproxen (NAPROSYN) 500 MG tablet Take 1 tablet (500 mg total) by mouth 2 (two) times daily as needed for moderate pain. 02/13/17   Evalee Jefferson, PA-C  Norethindrone-Ethinyl Estradiol-Fe Biphas (LO LOESTRIN FE) 1 MG-10 MCG / 10 MCG tablet Take 1 tablet by mouth daily. Take 1 daily by mouth 02/10/17   Estill Dooms, NP  norgestimate-ethinyl estradiol (ORTHO-CYCLEN,SPRINTEC,PREVIFEM) 0.25-35 MG-MCG tablet Take 1 tablet by mouth daily. 02/17/17   Christin Fudge, CNM    Family History Family History  Problem Relation Age of Onset  . Asthma Mother   . Diabetes Other   . Heart failure Other   . Cancer Other     Social History Social History  Substance Use Topics  . Smoking status: Current Every Day Smoker    Packs/day: 0.50    Types: Cigarettes  . Smokeless tobacco: Never Used  . Alcohol use 0.6 oz/week    1 Cans of beer per week     Comment: drinks 8% beers, liquor on the weekends- 1 pt/day- hx alcoholism     Allergies   Patient has no known allergies.   Review of Systems Review of Systems  Constitutional: Negative for appetite change and fever.  Respiratory: Negative for shortness of breath.   Cardiovascular: Negative for chest pain.  Gastrointestinal: Negative for abdominal pain, diarrhea,  nausea and vomiting.  Genitourinary: Positive for vaginal discharge. Negative for decreased urine volume, dysuria, flank pain, frequency, genital sores, pelvic pain and vaginal bleeding.  Musculoskeletal: Negative for back pain and myalgias.  Skin: Negative for rash.  Neurological: Negative for weakness and headaches.     Physical Exam Updated Vital Signs BP (!) 141/92   Pulse 96   Temp 98.4 F (36.9 C)   Resp 18   Ht 5\' 6"  (1.676 m)   Wt 86.2 kg (190 lb)   LMP 06/06/2017   SpO2 96%   BMI 30.67 kg/m   Physical Exam  Constitutional: She is oriented to person,  place, and time. She appears well-developed and well-nourished. No distress.  HENT:  Head: Atraumatic.  Mouth/Throat: Oropharynx is clear and moist.  Neck: Normal range of motion. Neck supple.  Cardiovascular: Normal rate.   Pulmonary/Chest: Breath sounds normal.  Abdominal: Soft. She exhibits no distension and no mass. There is no tenderness. There is no guarding.  Genitourinary: Cervix exhibits no motion tenderness and no discharge. Right adnexum displays no mass and no tenderness. Left adnexum displays no mass and no tenderness. No tenderness or bleeding in the vagina. No foreign body in the vagina. No signs of injury around the vagina.  Genitourinary Comments: Nursing also present during exam.  Small amt of milky vaginal discharge on exam.  No CMT, no adnexal masses or tenderness.    Musculoskeletal: Normal range of motion. She exhibits no edema.  Neurological: She is alert and oriented to person, place, and time. No cranial nerve deficit or sensory deficit.  Skin: Skin is warm and dry. Capillary refill takes less than 2 seconds. No rash noted.  Psychiatric: She has a normal mood and affect.  Nursing note and vitals reviewed.    ED Treatments / Results  Labs (all labs ordered are listed, but only abnormal results are displayed) Labs Reviewed  WET PREP, GENITAL - Abnormal; Notable for the following:       Result Value   Clue Cells Wet Prep HPF POC PRESENT (*)    WBC, Wet Prep HPF POC FEW (*)    All other components within normal limits  URINALYSIS, ROUTINE W REFLEX MICROSCOPIC - Abnormal; Notable for the following:    Color, Urine STRAW (*)    All other components within normal limits  PREGNANCY, URINE  GC/CHLAMYDIA PROBE AMP ([place]) NOT AT [facility]    EKG  EKG Interpretation None       Radiology No results found.  Procedures Procedures (including critical care time)  Medications Ordered in ED Medications - No data to display   Initial Impression / Assessment  and Plan / ED Course  I have reviewed the triage vital signs and the nursing notes.  Pertinent labs & imaging results that were available during my care of the patient were reviewed by me and considered in my medical decision making (see chart for details).     Pt is well appearing, non-toxic.  abd is soft, non-tender. No concerning sx's for PID or TOA.    Pt treated here with IM rocephin and po zithromax.  Well appearing.  Stable for d/c. All questions answered.  Return precautions discussed.    Final Clinical Impressions(s) / ED Diagnoses   Final diagnoses:  Vaginal discharge    New Prescriptions New Prescriptions   No medications on file     Kem Parkinson, PA-C 06/26/17 [place], [place], DO 06/27/17 1411

## 2017-06-25 NOTE — Discharge Instructions (Signed)
Return here if needed.  Avoid intercourse until all your test results have come back

## 2017-06-25 NOTE — ED Triage Notes (Signed)
Pt c/o green discharge from vagina that started today. Pt denies any pain, but states there is a foul odor.

## 2017-06-28 LAB — GC/CHLAMYDIA PROBE AMP (~~LOC~~) NOT AT ARMC
CHLAMYDIA, DNA PROBE: NEGATIVE
Neisseria Gonorrhea: NEGATIVE

## 2017-08-03 DIAGNOSIS — B379 Candidiasis, unspecified: Secondary | ICD-10-CM | POA: Insufficient documentation

## 2017-08-03 DIAGNOSIS — Z79899 Other long term (current) drug therapy: Secondary | ICD-10-CM | POA: Insufficient documentation

## 2017-08-03 DIAGNOSIS — I10 Essential (primary) hypertension: Secondary | ICD-10-CM | POA: Insufficient documentation

## 2017-08-03 DIAGNOSIS — E1165 Type 2 diabetes mellitus with hyperglycemia: Secondary | ICD-10-CM | POA: Insufficient documentation

## 2017-08-03 DIAGNOSIS — F17210 Nicotine dependence, cigarettes, uncomplicated: Secondary | ICD-10-CM | POA: Insufficient documentation

## 2017-08-03 DIAGNOSIS — Z7984 Long term (current) use of oral hypoglycemic drugs: Secondary | ICD-10-CM | POA: Insufficient documentation

## 2017-08-04 ENCOUNTER — Encounter (HOSPITAL_COMMUNITY): Payer: Self-pay | Admitting: Emergency Medicine

## 2017-08-04 ENCOUNTER — Emergency Department (HOSPITAL_COMMUNITY)
Admission: EM | Admit: 2017-08-04 | Discharge: 2017-08-04 | Disposition: A | Discharge status: Home / Self Care | Payer: Self-pay | Attending: Emergency Medicine | Admitting: Emergency Medicine

## 2017-08-04 DIAGNOSIS — R739 Hyperglycemia, unspecified: Secondary | ICD-10-CM

## 2017-08-04 DIAGNOSIS — B379 Candidiasis, unspecified: Secondary | ICD-10-CM

## 2017-08-04 LAB — URINALYSIS, ROUTINE W REFLEX MICROSCOPIC
BACTERIA UA: NONE SEEN
Bilirubin Urine: NEGATIVE
Glucose, UA: >=500 mg/dL — AB
HGB URINE DIPSTICK: NEGATIVE
Ketones, ur: NEGATIVE mg/dL
LEUKOCYTES UA: NEGATIVE
Nitrite: NEGATIVE
PROTEIN: NEGATIVE mg/dL
Specific Gravity, Urine: 1.028 (ref 1.005–1.030)
pH: 5 (ref 5.0–8.0)

## 2017-08-04 LAB — WET PREP, GENITAL
SPERM: NONE SEEN
TRICH WET PREP: NONE SEEN
YEAST WET PREP: NONE SEEN

## 2017-08-04 LAB — PREGNANCY, URINE: PREG TEST UR: NEGATIVE

## 2017-08-04 LAB — CBG MONITORING, ED: GLUCOSE-CAPILLARY: 280 mg/dL — AB (ref 65–99)

## 2017-08-04 MED ORDER — NYSTATIN 100000 UNIT/GM EX CREA: TOPICAL_CREAM | CUTANEOUS | 0 refills | Status: DC

## 2017-08-04 MED ORDER — FLUCONAZOLE 100 MG PO TABS
100.0000 mg | ORAL_TABLET | Freq: Once | ORAL | Status: AC
  Administered 2017-08-04: 100 mg via ORAL
  Filled 2017-08-04: qty 1

## 2017-08-04 NOTE — ED Triage Notes (Signed)
Pt c/o vaginal itching for a few days with rash.

## 2017-08-04 NOTE — Discharge Instructions (Signed)
You were seen today for vaginal itching and discharge. You likely have a yeast infection. This can be worsened by high blood sugars. You need to modify her diet and decrease sugar intake. Continue diabetes medications. Follow-up closely with her primary physician for adjustments in medications.

## 2017-08-04 NOTE — ED Provider Notes (Signed)
[facility] DEPT Provider Note   CSN: [id number] Arrival date & time: 08/03/17  2351     History   Chief Complaint Chief Complaint  Patient presents with  . Vaginal Itching    HPI Mallory Mcdowell is a 31 y.o. female.  HPI  This is a 31 year old female with a history of diabetes and hypertension who presents with vaginal itching. Patient reports several day history of worsening vaginal itching. She denies any significant vaginal discharge. She reports recent workup for STDs that was negative. She denies any abdominal pain. Denies any dysuria or hematuria. Does not think she could be pregnant. Reports that recent blood sugars have been "okay." No fevers or other systemic symptoms.  Past Medical History:  Diagnosis Date  . Diabetes mellitus without complication ([place])   . Hypertension     Patient Active Problem List   Diagnosis Date Noted  . Encounter for physical examination, contraception, and Papanicolaou smear 12/24/2016  . Family planning 12/24/2016  . Cigarette nicotine dependence without complication 41/93/7902  . Uncontrolled diabetes mellitus type 2 without complications ([place]) 40/97/3532  . Elevated platelet count 05/01/2016  . Alcohol abuse 05/01/2016    History reviewed. No pertinent surgical history.  OB History    Gravida Para Term Preterm AB Living   0 0 0 0 0 0   SAB TAB Ectopic Multiple Live Births   0 0 0 0         Home Medications    Prior to Admission medications   Medication Sig Start Date End Date Taking? Authorizing Provider  HYDROcodone-acetaminophen (NORCO/VICODIN) 5-325 MG tablet Take 1 tablet by mouth every 4 (four) hours as needed. 02/13/17   Evalee Jefferson, PA-C  lovastatin (MEVACOR) 20 MG tablet Take 1 tablet (20 mg total) by mouth at bedtime. Patient not taking: Reported on 02/12/2017 12/08/16   Soyla Dryer, PA-C  metFORMIN (GLUCOPHAGE) 500 MG tablet Take 1 tablet (500 mg total) by mouth 2 (two) times daily with a meal. Reported on  04/30/2016 12/08/16   Soyla Dryer, PA-C  methocarbamol (ROBAXIN) 500 MG tablet Take 1 tablet (500 mg total) by mouth 3 (three) times daily. 05/05/17   Lily Kocher, PA-C  metroNIDAZOLE (FLAGYL) 500 MG tablet Take 1 tablet (500 mg total) by mouth 2 (two) times daily. 06/25/17   Triplett, Tammy, PA-C  naproxen (NAPROSYN) 500 MG tablet Take 1 tablet (500 mg total) by mouth 2 (two) times daily as needed for moderate pain. 02/13/17   Evalee Jefferson, PA-C  Norethindrone-Ethinyl Estradiol-Fe Biphas (LO LOESTRIN FE) 1 MG-10 MCG / 10 MCG tablet Take 1 tablet by mouth daily. Take 1 daily by mouth 02/10/17   Estill Dooms, NP  norgestimate-ethinyl estradiol (ORTHO-CYCLEN,SPRINTEC,PREVIFEM) 0.25-35 MG-MCG tablet Take 1 tablet by mouth daily. 02/17/17   Cresenzo-Dishmon, Joaquim Lai, CNM  nystatin cream (MYCOSTATIN) Apply to affected area 2 times daily 08/04/17   Horton, Barbette Hair, MD    Family History Family History  Problem Relation Age of Onset  . Asthma Mother   . Diabetes Other   . Heart failure Other   . Cancer Other     Social History Social History  Substance Use Topics  . Smoking status: Current Every Day Smoker    Packs/day: 0.50    Types: Cigarettes  . Smokeless tobacco: Never Used  . Alcohol use 0.6 oz/week    1 Cans of beer per week     Comment: drinks 8% beers, liquor on the weekends- 1 pt/day- hx alcoholism  Allergies   Patient has no known allergies.   Review of Systems Review of Systems  Constitutional: Negative for fever.  Gastrointestinal: Negative for abdominal pain.  Genitourinary: Positive for vaginal pain. Negative for dysuria, vaginal bleeding and vaginal discharge.  All other systems reviewed and are negative.    Physical Exam Updated Vital Signs BP (!) 134/93   Pulse 97   Temp 98.5 F (36.9 C)   Resp 18   Ht 5\' 6"  (1.676 m)   Wt 86.2 kg (190 lb)   LMP 07/06/2017   SpO2 97%   BMI 30.67 kg/m   Physical Exam  Constitutional: She is oriented to  person, place, and time. She appears well-developed and well-nourished. No distress.  HENT:  Head: Normocephalic and atraumatic.  Cardiovascular: Normal rate and regular rhythm.   Pulmonary/Chest: Effort normal. No respiratory distress.  Abdominal: Soft. There is no tenderness.  Genitourinary:  Genitourinary Comments: Excoriation and lichenification over the bilateral labia, no obvious lesions noted, there is thick white vaginal discharge, no cervical friability noted  Neurological: She is alert and oriented to person, place, and time.  Skin: Skin is warm and dry.  Psychiatric: She has a normal mood and affect.  Nursing note and vitals reviewed.    ED Treatments / Results  Labs (all labs ordered are listed, but only abnormal results are displayed) Labs Reviewed  WET PREP, GENITAL - Abnormal; Notable for the following:       Result Value   Clue Cells Wet Prep HPF POC PRESENT (*)    WBC, Wet Prep HPF POC FEW (*)    All other components within normal limits  URINALYSIS, ROUTINE W REFLEX MICROSCOPIC - Abnormal; Notable for the following:    Glucose, UA >=500 (*)    Squamous Epithelial / LPF 0-5 (*)    All other components within normal limits  CBG MONITORING, ED - Abnormal; Notable for the following:    Glucose-Capillary 280 (*)    All other components within normal limits  PREGNANCY, URINE    EKG  EKG Interpretation None       Radiology No results found.  Procedures Procedures (including critical care time)  Medications Ordered in ED Medications  fluconazole (DIFLUCAN) tablet 100 mg (not administered)     Initial Impression / Assessment and Plan / ED Course  I have reviewed the triage vital signs and the nursing notes.  Pertinent labs & imaging results that were available during my care of the patient were reviewed by me and considered in my medical decision making (see chart for details).    Patient presents with vaginal itching. Exam is most consistent  withyeast infection. She likely hasskin involvement as well given lichenification and excoriations. Wet prep does not show yeast but it may not have been in a sufficient sample. Patient was given Diflucan. Will prescribe topical nystatin. Patient's blood glucose in the 200s. I discussed with the patient that this is likely contributing. She needs to modify her diet and follow up closely with her primary physician for modifications in her diabetes medications.  After history, exam, and medical workup I feel the patient has been appropriately medically screened and is safe for discharge home. Pertinent diagnoses were discussed with the patient. Patient was given return precautions.   Final Clinical Impressions(s) / ED Diagnoses   Final diagnoses:  Yeast infection  Hyperglycemia    New Prescriptions New Prescriptions   NYSTATIN CREAM (MYCOSTATIN)    Apply to affected area 2 times daily  Merryl Hacker, MD 08/04/17 0157

## 2017-08-10 ENCOUNTER — Ambulatory Visit: Payer: Self-pay | Admitting: Physician Assistant

## 2017-09-13 ENCOUNTER — Encounter (HOSPITAL_COMMUNITY): Payer: Self-pay | Admitting: Emergency Medicine

## 2017-09-13 ENCOUNTER — Emergency Department (HOSPITAL_COMMUNITY)
Admission: EM | Admit: 2017-09-13 | Discharge: 2017-09-13 | Disposition: A | Discharge status: Home / Self Care | Payer: Medicaid Other | Attending: Emergency Medicine | Admitting: Emergency Medicine

## 2017-09-13 DIAGNOSIS — Y999 Unspecified external cause status: Secondary | ICD-10-CM | POA: Insufficient documentation

## 2017-09-13 DIAGNOSIS — Z79899 Other long term (current) drug therapy: Secondary | ICD-10-CM | POA: Insufficient documentation

## 2017-09-13 DIAGNOSIS — Y939 Activity, unspecified: Secondary | ICD-10-CM | POA: Insufficient documentation

## 2017-09-13 DIAGNOSIS — I10 Essential (primary) hypertension: Secondary | ICD-10-CM | POA: Insufficient documentation

## 2017-09-13 DIAGNOSIS — Z23 Encounter for immunization: Secondary | ICD-10-CM | POA: Insufficient documentation

## 2017-09-13 DIAGNOSIS — Z7984 Long term (current) use of oral hypoglycemic drugs: Secondary | ICD-10-CM | POA: Insufficient documentation

## 2017-09-13 DIAGNOSIS — S81812A Laceration without foreign body, left lower leg, initial encounter: Secondary | ICD-10-CM

## 2017-09-13 DIAGNOSIS — F17210 Nicotine dependence, cigarettes, uncomplicated: Secondary | ICD-10-CM | POA: Insufficient documentation

## 2017-09-13 DIAGNOSIS — Y929 Unspecified place or not applicable: Secondary | ICD-10-CM | POA: Insufficient documentation

## 2017-09-13 DIAGNOSIS — W25XXXA Contact with sharp glass, initial encounter: Secondary | ICD-10-CM | POA: Insufficient documentation

## 2017-09-13 DIAGNOSIS — E119 Type 2 diabetes mellitus without complications: Secondary | ICD-10-CM | POA: Insufficient documentation

## 2017-09-13 MED ORDER — TETANUS-DIPHTH-ACELL PERTUSSIS 5-2.5-18.5 LF-MCG/0.5 IM SUSP
0.5000 mL | Freq: Once | INTRAMUSCULAR | Status: AC
  Administered 2017-09-13: 0.5 mL via INTRAMUSCULAR
  Filled 2017-09-13: qty 0.5

## 2017-09-13 MED ORDER — LIDOCAINE HCL (PF) 2 % IJ SOLN
INTRAMUSCULAR | Status: AC
  Filled 2017-09-13: qty 10

## 2017-09-13 MED ORDER — LIDOCAINE HCL (PF) 2 % IJ SOLN: 5.0000 mL | Freq: Once | INTRAMUSCULAR | Status: DC

## 2017-09-13 NOTE — Discharge Instructions (Signed)
Clean the wound with mild soap and water.  Keep it bandaged.  Staples out in 10 days.  Return sooner for any signs of infection

## 2017-09-13 NOTE — ED Provider Notes (Signed)
[facility] EMERGENCY DEPARTMENT Provider Note   CSN: [id number] Arrival date & time: 09/13/17  1555     History   Chief Complaint Chief Complaint  Patient presents with  . Laceration    HPI Mallory Mcdowell is a 31 y.o. female.  HPI  Mallory Mcdowell is a 31 y.o. female who presents to the Emergency Department complaining of laceration to left lower leg.  States that a piece of glass fell out of the trash and caused a lac to her leg.  Complains of discomfort to her leg.  Bleeding controlled with direct pressure prior to arrival.  Denies numbness, swelling, and ASA use.  Last td is unknown.   Past Medical History:  Diagnosis Date  . Diabetes mellitus without complication ([place])   . Hypertension     Patient Active Problem List   Diagnosis Date Noted  . Encounter for physical examination, contraception, and Papanicolaou smear 12/24/2016  . Family planning 12/24/2016  . Cigarette nicotine dependence without complication 73/41/9379  . Uncontrolled diabetes mellitus type 2 without complications ([place]) 02/40/9735  . Elevated platelet count 05/01/2016  . Alcohol abuse 05/01/2016    History reviewed. No pertinent surgical history.  OB History    Gravida Para Term Preterm AB Living   0 0 0 0 0 0   SAB TAB Ectopic Multiple Live Births   0 0 0 0         Home Medications    Prior to Admission medications   Medication Sig Start Date End Date Taking? Authorizing Provider  HYDROcodone-acetaminophen (NORCO/VICODIN) 5-325 MG tablet Take 1 tablet by mouth every 4 (four) hours as needed. 02/13/17   Evalee Jefferson, PA-C  lovastatin (MEVACOR) 20 MG tablet Take 1 tablet (20 mg total) by mouth at bedtime. Patient not taking: Reported on 02/12/2017 12/08/16   Soyla Dryer, PA-C  metFORMIN (GLUCOPHAGE) 500 MG tablet Take 1 tablet (500 mg total) by mouth 2 (two) times daily with a meal. Reported on 04/30/2016 12/08/16   Soyla Dryer, PA-C  methocarbamol (ROBAXIN) 500 MG tablet Take 1  tablet (500 mg total) by mouth 3 (three) times daily. 05/05/17   Lily Kocher, PA-C  metroNIDAZOLE (FLAGYL) 500 MG tablet Take 1 tablet (500 mg total) by mouth 2 (two) times daily. 06/25/17   Day Deery, PA-C  naproxen (NAPROSYN) 500 MG tablet Take 1 tablet (500 mg total) by mouth 2 (two) times daily as needed for moderate pain. 02/13/17   Evalee Jefferson, PA-C  Norethindrone-Ethinyl Estradiol-Fe Biphas (LO LOESTRIN FE) 1 MG-10 MCG / 10 MCG tablet Take 1 tablet by mouth daily. Take 1 daily by mouth 02/10/17   Estill Dooms, NP  norgestimate-ethinyl estradiol (ORTHO-CYCLEN,SPRINTEC,PREVIFEM) 0.25-35 MG-MCG tablet Take 1 tablet by mouth daily. 02/17/17   Cresenzo-Dishmon, Joaquim Lai, CNM  nystatin cream (MYCOSTATIN) Apply to affected area 2 times daily 08/04/17   Horton, Barbette Hair, MD    Family History Family History  Problem Relation Age of Onset  . Asthma Mother   . Diabetes Other   . Heart failure Other   . Cancer Other     Social History Social History   Tobacco Use  . Smoking status: Current Every Day Smoker    Packs/day: 0.50    Types: Cigarettes  . Smokeless tobacco: Never Used  Substance Use Topics  . Alcohol use: Yes    Alcohol/week: 0.6 oz    Types: 1 Cans of beer per week    Comment: drinks 8% beers, liquor on the weekends-  1 pt/day- hx alcoholism  . Drug use: Yes    Types: Marijuana    Comment: 2 days ago     Allergies   Patient has no known allergies.   Review of Systems Review of Systems  Constitutional: Negative for chills and fever.  Musculoskeletal: Negative for arthralgias, back pain and joint swelling.  Skin: Positive for wound.       Laceration left lower leg  Neurological: Negative for dizziness, weakness and numbness.  Hematological: Does not bruise/bleed easily.     Physical Exam Updated Vital Signs BP (!) 138/93 (BP Location: Right Arm)   Pulse (!) 106   Temp 98.5 F (36.9 C) (Oral)   Resp 18   Ht 5\' 5"  (1.651 m)   Wt 86.2 kg (190 lb)    LMP 09/03/2017   SpO2 95%   BMI 31.62 kg/m   Physical Exam  Constitutional: She is oriented to person, place, and time. She appears well-developed and well-nourished. No distress.  HENT:  Head: Normocephalic and atraumatic.  Cardiovascular: Normal rate, regular rhythm and intact distal pulses.  Pulmonary/Chest: Effort normal and breath sounds normal. No respiratory distress.  Musculoskeletal: Normal range of motion. She exhibits no edema or tenderness.  Pt has full ROM of left lower leg. Wound explored, no FB's, or injury to deep structures seen.  Neurological: She is alert and oriented to person, place, and time. No sensory deficit. She exhibits normal muscle tone. Coordination normal.  Skin: Skin is warm. Capillary refill takes less than 2 seconds. Laceration noted.  Laceration to the distal left lower leg.  Bleeding controlled.  No edema.    Nursing note and vitals reviewed.    ED Treatments / Results  Labs (all labs ordered are listed, but only abnormal results are displayed) Labs Reviewed - No data to display  EKG  EKG Interpretation None       Radiology No results found.  Procedures Procedures (including critical care time)  LACERATION REPAIR Performed by: Griff Badley L. Authorized by: Hale Bogus Consent: Verbal consent obtained. Risks and benefits: risks, benefits and alternatives were discussed Consent given by: patient Patient identity confirmed: provided demographic data Prepped and Draped in normal sterile fashion Wound explored  Laceration Location: left lower leg  Laceration Length: 3 cm  No Foreign Bodies seen or palpated  Anesthesia: local infiltration  Local anesthetic: lidocaine 2 % w/o epinephrine  Anesthetic total: 2 ml  Irrigation method: syringe Amount of cleaning: standard  Skin closure: staples  Number of staples:  4  Technique: stapling  Patient tolerance: Patient tolerated the procedure well with no immediate  complications.   Medications Ordered in ED Medications  Tdap (BOOSTRIX) injection 0.5 mL (not administered)  lidocaine (XYLOCAINE) 2 % injection 5 mL (not administered)  lidocaine (XYLOCAINE) 2 % injection (not administered)     Initial Impression / Assessment and Plan / ED Course  I have reviewed the triage vital signs and the nursing notes.  Pertinent labs & imaging results that were available during my care of the patient were reviewed by me and considered in my medical decision making (see chart for details).     Bleeding controlled prio to closure.  NV intact.  Wound care instructions discussed.  Td updated.  Staples out in 10 days  Final Clinical Impressions(s) / ED Diagnoses   Final diagnoses:  Laceration of left lower leg, initial encounter    ED Discharge Orders    None       Enrico Eaddy, PA-C  09/16/17 1255    Milton Ferguson, MD 09/20/17 1229

## 2017-09-13 NOTE — ED Triage Notes (Signed)
PT states a glass cup fell out of the trash can and caused a laceration to left lower leg. Bleeding controlled at this time.

## 2017-09-26 ENCOUNTER — Other Ambulatory Visit: Payer: Self-pay

## 2017-09-26 ENCOUNTER — Encounter (HOSPITAL_COMMUNITY): Payer: Self-pay

## 2017-09-26 ENCOUNTER — Emergency Department (HOSPITAL_COMMUNITY)
Admission: EM | Admit: 2017-09-26 | Discharge: 2017-09-26 | Disposition: A | Discharge status: Home / Self Care | Payer: Self-pay | Attending: Emergency Medicine | Admitting: Emergency Medicine

## 2017-09-26 DIAGNOSIS — Z7984 Long term (current) use of oral hypoglycemic drugs: Secondary | ICD-10-CM | POA: Insufficient documentation

## 2017-09-26 DIAGNOSIS — Z4802 Encounter for removal of sutures: Secondary | ICD-10-CM | POA: Insufficient documentation

## 2017-09-26 DIAGNOSIS — F17210 Nicotine dependence, cigarettes, uncomplicated: Secondary | ICD-10-CM | POA: Insufficient documentation

## 2017-09-26 DIAGNOSIS — Z79899 Other long term (current) drug therapy: Secondary | ICD-10-CM | POA: Insufficient documentation

## 2017-09-26 DIAGNOSIS — E119 Type 2 diabetes mellitus without complications: Secondary | ICD-10-CM | POA: Insufficient documentation

## 2017-09-26 DIAGNOSIS — I10 Essential (primary) hypertension: Secondary | ICD-10-CM | POA: Insufficient documentation

## 2017-09-26 MED ORDER — CEPHALEXIN 500 MG PO CAPS: 500.0000 mg | ORAL_CAPSULE | Freq: Four times a day (QID) | ORAL | 0 refills | Status: DC

## 2017-09-26 MED ORDER — BACITRACIN ZINC 500 UNIT/GM EX OINT
TOPICAL_OINTMENT | Freq: Once | CUTANEOUS | Status: AC
  Administered 2017-09-26: 1 via TOPICAL
  Filled 2017-09-26: qty 0.9

## 2017-09-26 NOTE — Discharge Instructions (Signed)
You can take Tylenol or Ibuprofen as directed for pain. You can alternate Tylenol and Ibuprofen every 4 hours. If you take Tylenol at 1pm, then you can take Ibuprofen at 5pm. Then you can take Tylenol again at 9pm.   Keep the area clean and dry.  He can apply bacitracin and Neosporin ointment 2 times a day.  Take the anti-biotics as directed.  Return to the emergency department for any fever, redness or swelling around the wound, draining from the wound or any other worsening or concerning symptoms.

## 2017-09-26 NOTE — ED Triage Notes (Signed)
Pt arrives by POV for removal of 4 staples from LLE. Pt denies pain at this time. Wound presents closed and healed.

## 2017-09-26 NOTE — ED Notes (Signed)
PA notified pt is asking about a possible yeast infection. Per PA advise pt to take OTC medication.

## 2017-09-26 NOTE — ED Provider Notes (Signed)
[facility] EMERGENCY DEPARTMENT Provider Note   CSN: [id number] Arrival date & time: 09/26/17  1129     History   Chief Complaint Chief Complaint  Patient presents with  . Suture / Staple Removal    HPI Mallory Mcdowell is a 31 y.o. female presents for staple removal.  Patient had a laceration to the left lower extremity on 09/16/17 was repaired with staples.  Patient comes in today for staple removal.  She denies any redness or warmth around the wound.  She has been keeping it clean and dry.  She denies any drainage or fevers noted.  The history is provided by the patient.    Past Medical History:  Diagnosis Date  . Diabetes mellitus without complication ([place])   . Hypertension     Patient Active Problem List   Diagnosis Date Noted  . Encounter for physical examination, contraception, and Papanicolaou smear 12/24/2016  . Family planning 12/24/2016  . Cigarette nicotine dependence without complication 09/73/5329  . Uncontrolled diabetes mellitus type 2 without complications ([place]) 92/42/6834  . Elevated platelet count 05/01/2016  . Alcohol abuse 05/01/2016    History reviewed. No pertinent surgical history.  OB History    Gravida Para Term Preterm AB Living   0 0 0 0 0 0   SAB TAB Ectopic Multiple Live Births   0 0 0 0         Home Medications    Prior to Admission medications   Medication Sig Start Date End Date Taking? Authorizing Provider  cephALEXin (KEFLEX) 500 MG capsule Take 1 capsule (500 mg total) 4 (four) times daily by mouth. 09/26/17   Volanda Napoleon, PA-C  HYDROcodone-acetaminophen (NORCO/VICODIN) 5-325 MG tablet Take 1 tablet by mouth every 4 (four) hours as needed. 02/13/17   Evalee Jefferson, PA-C  lovastatin (MEVACOR) 20 MG tablet Take 1 tablet (20 mg total) by mouth at bedtime. Patient not taking: Reported on 02/12/2017 12/08/16   Soyla Dryer, PA-C  metFORMIN (GLUCOPHAGE) 500 MG tablet Take 1 tablet (500 mg total) by mouth 2 (two) times daily  with a meal. Reported on 04/30/2016 12/08/16   Soyla Dryer, PA-C  methocarbamol (ROBAXIN) 500 MG tablet Take 1 tablet (500 mg total) by mouth 3 (three) times daily. 05/05/17   Lily Kocher, PA-C  metroNIDAZOLE (FLAGYL) 500 MG tablet Take 1 tablet (500 mg total) by mouth 2 (two) times daily. 06/25/17   Triplett, Tammy, PA-C  naproxen (NAPROSYN) 500 MG tablet Take 1 tablet (500 mg total) by mouth 2 (two) times daily as needed for moderate pain. 02/13/17   Evalee Jefferson, PA-C  Norethindrone-Ethinyl Estradiol-Fe Biphas (LO LOESTRIN FE) 1 MG-10 MCG / 10 MCG tablet Take 1 tablet by mouth daily. Take 1 daily by mouth 02/10/17   Estill Dooms, NP  norgestimate-ethinyl estradiol (ORTHO-CYCLEN,SPRINTEC,PREVIFEM) 0.25-35 MG-MCG tablet Take 1 tablet by mouth daily. 02/17/17   Cresenzo-Dishmon, Joaquim Lai, CNM  nystatin cream (MYCOSTATIN) Apply to affected area 2 times daily 08/04/17   Horton, Barbette Hair, MD    Family History Family History  Problem Relation Age of Onset  . Asthma Mother   . Diabetes Other   . Heart failure Other   . Cancer Other     Social History Social History   Tobacco Use  . Smoking status: Current Every Day Smoker    Packs/day: 0.50    Types: Cigarettes  . Smokeless tobacco: Never Used  Substance Use Topics  . Alcohol use: Yes    Alcohol/week: 0.6 oz  Types: 1 Cans of beer per week    Comment: drinks 8% beers, liquor on the weekends- 1 pt/day- hx alcoholism  . Drug use: Yes    Types: Marijuana    Comment: 2 days ago     Allergies   Patient has no known allergies.   Review of Systems Review of Systems  Constitutional: Negative for fever.  Skin: Positive for wound. Negative for color change.     Physical Exam Updated Vital Signs BP (!) 139/97 (BP Location: Left Arm)   Pulse 100   Temp 98.5 F (36.9 C) (Oral)   Resp 20   Ht 5\' 5"  (1.651 m)   Wt 86.2 kg (190 lb)   LMP 09/03/2017   SpO2 99%   BMI 31.62 kg/m   Physical Exam  Constitutional: She  appears well-developed and well-nourished.  HENT:  Head: Normocephalic and atraumatic.  Eyes: Conjunctivae and EOM are normal. Right eye exhibits no discharge. Left eye exhibits no discharge. No scleral icterus.  Pulmonary/Chest: Effort normal.  Neurological: She is alert.  Skin: Skin is warm and dry.  2 cm healing last lower extremity staples in place.  Minimal surrounding erythema.  No warmth noted.  Psychiatric: She has a normal mood and affect. Her speech is normal and behavior is normal.  Nursing note and vitals reviewed.    ED Treatments / Results  Labs (all labs ordered are listed, but only abnormal results are displayed) Labs Reviewed - No data to display  EKG  EKG Interpretation None       Radiology No results found.  Procedures Procedures (including critical care time)  Medications Ordered in ED Medications  bacitracin ointment (1 application Topical Given 09/26/17 1244)     Initial Impression / Assessment and Plan / ED Course  I have reviewed the triage vital signs and the nursing notes.  Pertinent labs & imaging results that were available during my care of the patient were reviewed by me and considered in my medical decision making (see chart for details).     31 y.o. F who presents for evaluation of suture removal left lower extremity laceration that occurred approximately 10 days ago.  Patient denies any warmth or erythema noted to the wound.  No fevers.  No purulent drainage noted. Patient is afebrile, non-toxic appearing, sitting comfortably on examination table. Vital signs reviewed and stable.  Slightly tachycardic.  We will plan to reassess.  Physical exam shows healing laceration noted to the anterior aspect of the left lower extremity with 4 staples in place.  There is some minimal surrounding erythema.  No surrounding warmth.   Staples removed as documented above.  There was some mild purulent drainage noted when the sutures were removed.  We will  plan to start patient on antibiotics. Patient with no known drug allergies.  Encouraged wound care directions with patient. Instructed patient to follow-up with PCP in 2 days. Strict return precautions discussed. Patient expresses understanding and agreement to plan.    Final Clinical Impressions(s) / ED Diagnoses   Final diagnoses:  Visit for suture removal    ED Discharge Orders        Ordered    cephALEXin (KEFLEX) 500 MG capsule  4 times daily     09/26/17 1249       Desma Mcgregor 09/26/17 1713    Isla Pence, MD 09/27/17 [phone]

## 2017-10-02 ENCOUNTER — Encounter (HOSPITAL_COMMUNITY): Payer: Self-pay | Admitting: Emergency Medicine

## 2017-10-02 ENCOUNTER — Other Ambulatory Visit: Payer: Self-pay

## 2017-10-02 ENCOUNTER — Emergency Department (HOSPITAL_COMMUNITY)
Admission: EM | Admit: 2017-10-02 | Discharge: 2017-10-03 | Disposition: A | Discharge status: Home / Self Care | Payer: Self-pay | Attending: Emergency Medicine | Admitting: Emergency Medicine

## 2017-10-02 DIAGNOSIS — F17210 Nicotine dependence, cigarettes, uncomplicated: Secondary | ICD-10-CM | POA: Insufficient documentation

## 2017-10-02 DIAGNOSIS — B9689 Other specified bacterial agents as the cause of diseases classified elsewhere: Secondary | ICD-10-CM | POA: Insufficient documentation

## 2017-10-02 DIAGNOSIS — N898 Other specified noninflammatory disorders of vagina: Secondary | ICD-10-CM

## 2017-10-02 DIAGNOSIS — I10 Essential (primary) hypertension: Secondary | ICD-10-CM | POA: Insufficient documentation

## 2017-10-02 DIAGNOSIS — N760 Acute vaginitis: Secondary | ICD-10-CM | POA: Insufficient documentation

## 2017-10-02 DIAGNOSIS — E119 Type 2 diabetes mellitus without complications: Secondary | ICD-10-CM | POA: Insufficient documentation

## 2017-10-02 LAB — URINALYSIS, ROUTINE W REFLEX MICROSCOPIC
BACTERIA UA: NONE SEEN
Bilirubin Urine: NEGATIVE
KETONES UR: 5 mg/dL — AB
Leukocytes, UA: NEGATIVE
NITRITE: NEGATIVE
PROTEIN: 30 mg/dL — AB
Specific Gravity, Urine: 1.028 (ref 1.005–1.030)
pH: 5 (ref 5.0–8.0)

## 2017-10-02 LAB — WET PREP, GENITAL
Sperm: NONE SEEN
Trich, Wet Prep: NONE SEEN
Yeast Wet Prep HPF POC: NONE SEEN

## 2017-10-02 LAB — PREGNANCY, URINE: PREG TEST UR: NEGATIVE

## 2017-10-02 NOTE — ED Triage Notes (Signed)
Vaginal itching, discharge and burning with urination x over a week

## 2017-10-02 NOTE — ED Provider Notes (Signed)
[facility] EMERGENCY DEPARTMENT Provider Note   CSN: [id number] Arrival date & time: 10/02/17  2002     History   Chief Complaint No chief complaint on file.   HPI Mallory Mcdowell is a 31 y.o. female.  HPI   Mallory Mcdowell is a 31 y.o. female who presents to the Emergency Department complaining of vaginal itching, discharge, burning with urination.  Symptoms have been present for more than 1 week.  This is a recurrent problem.  She was seen here in September for same.  She states the symptoms slightly improved, but never resolved.  She was given a prescription for Diflucan, but lost the prescription.  Denies recent unprotected or new sexual partner.  She also denies fever, chills, abdominal or pelvic pain, nausea or vomiting.  She is concerned that she may have a urinary tract infection.  Began menstrual period today.  She has been using over-the-counter Vagisil cream without relief.   Past Medical History:  Diagnosis Date  . Diabetes mellitus without complication ([place])   . Hypertension     Patient Active Problem List   Diagnosis Date Noted  . Encounter for physical examination, contraception, and Papanicolaou smear 12/24/2016  . Family planning 12/24/2016  . Cigarette nicotine dependence without complication 69/67/8938  . Uncontrolled diabetes mellitus type 2 without complications ([place]) 08/25/5101  . Elevated platelet count 05/01/2016  . Alcohol abuse 05/01/2016    History reviewed. No pertinent surgical history.  OB History    Gravida Para Term Preterm AB Living   0 0 0 0 0 0   SAB TAB Ectopic Multiple Live Births   0 0 0 0         Home Medications    Prior to Admission medications   Medication Sig Start Date End Date Taking? Authorizing Provider  cephALEXin (KEFLEX) 500 MG capsule Take 1 capsule (500 mg total) 4 (four) times daily by mouth. Patient not taking: Reported on 10/02/2017 09/26/17   [place] Lanius A, PA-C  metroNIDAZOLE (FLAGYL) 500 MG  tablet Take 1 tablet (500 mg total) by mouth 2 (two) times daily. Patient not taking: Reported on 10/02/2017 06/25/17   Kem Parkinson, PA-C    Family History Family History  Problem Relation Age of Onset  . Asthma Mother   . Diabetes Other   . Heart failure Other   . Cancer Other     Social History Social History   Tobacco Use  . Smoking status: Current Every Day Smoker    Packs/day: 0.50    Types: Cigarettes  . Smokeless tobacco: Never Used  Substance Use Topics  . Alcohol use: Yes    Alcohol/week: 0.6 oz    Types: 1 Cans of beer per week    Comment: drinks 8% beers, liquor on the weekends- 1 pt/day- hx alcoholism  . Drug use: Yes    Comment: 2 days ago     Allergies   Patient has no known allergies.   Review of Systems Review of Systems  Constitutional: Negative for chills and fever.  Respiratory: Negative for chest tightness and shortness of breath.   Cardiovascular: Negative for chest pain.  Gastrointestinal: Negative for abdominal pain, diarrhea, nausea and vomiting.  Genitourinary: Positive for dysuria and vaginal discharge. Negative for difficulty urinating, flank pain, frequency, genital sores, hematuria and vaginal bleeding.       Vaginal itching  Musculoskeletal: Negative for arthralgias.  Skin: Negative for rash.  Neurological: Negative for dizziness, weakness, numbness and headaches.  Physical Exam Updated Vital Signs BP (!) 141/91   Pulse 98   Temp 98.5 F (36.9 C) (Oral)   Resp 18   Ht 5\' 5"  (1.651 m)   Wt 86.2 kg (190 lb)   LMP 10/02/2017   SpO2 98%   BMI 31.62 kg/m   Physical Exam  Constitutional: She is oriented to person, place, and time. She appears well-developed and well-nourished. No distress.  HENT:  Head: Normocephalic and atraumatic.  Mouth/Throat: Oropharynx is clear and moist.  Cardiovascular: Normal rate and normal heart sounds.  Pulmonary/Chest: Effort normal and breath sounds normal.  Abdominal: Soft. She exhibits  no distension. There is no tenderness. There is no rebound and no guarding.  Genitourinary: Uterus normal. Cervix exhibits no motion tenderness and no friability. Right adnexum displays no mass and no tenderness. Left adnexum displays no mass and no tenderness.  Genitourinary Comments: Vaginal exam performed by me, assisted by nursing staff. Excoriations to the labia majora.  No rash or genital lesions noted.  Pt is menstruating.  No vaginal discharge seen on exam  Musculoskeletal: Normal range of motion. She exhibits no tenderness.  Neurological: She is alert and oriented to person, place, and time.  Skin: Skin is warm and dry.  Psychiatric: She has a normal mood and affect. Judgment normal.  Nursing note and vitals reviewed.    ED Treatments / Results  Labs (all labs ordered are listed, but only abnormal results are displayed) Labs Reviewed  WET PREP, GENITAL  URINALYSIS, ROUTINE W REFLEX MICROSCOPIC  PREGNANCY, URINE  GC/CHLAMYDIA PROBE AMP ([place]) NOT AT [facility]    EKG  EKG Interpretation None       Radiology No results found.  Procedures Procedures (including critical care time)  Medications Ordered in ED Medications - No data to display   Initial Impression / Assessment and Plan / ED Course  I have reviewed the triage vital signs and the nursing notes.  Pertinent labs & imaging results that were available during my care of the patient were reviewed by me and considered in my medical decision making (see chart for details).     Pt with recurrent vaginal itching.  No abdominal or pelvic pain.  Did not get previous rx for Diflucan filled, no yeast on wet prep tonight.  No d/c on exam, mild edema and excoriations to labia.  Will tx with flagyl and agrees to use OTC hydrocortisone externally,  Advised to change soap  Final Clinical Impressions(s) / ED Diagnoses   Final diagnoses:  Vaginal itching  BV (bacterial vaginosis)    ED Discharge Orders    None        Kem Parkinson, PA-C 88/28/00 3491    Delora Fuel, MD 79/15/05 [phone]

## 2017-10-03 MED ORDER — METRONIDAZOLE 500 MG PO TABS: 500.0000 mg | ORAL_TABLET | Freq: Two times a day (BID) | ORAL | 0 refills | Status: DC

## 2017-10-03 NOTE — Discharge Instructions (Signed)
Try using a mild soap such as Dove or Caress.  You can also try a small amt of 1% hydrocortisone 2 times a day to the external vagina.  Call [facility] to arrange a follow-up appt if needed

## 2017-10-04 LAB — GC/CHLAMYDIA PROBE AMP (~~LOC~~) NOT AT ARMC
CHLAMYDIA, DNA PROBE: NEGATIVE
Neisseria Gonorrhea: NEGATIVE

## 2017-10-05 LAB — URINE CULTURE

## 2018-02-15 ENCOUNTER — Encounter (HOSPITAL_COMMUNITY): Payer: Self-pay | Admitting: *Deleted

## 2018-02-15 ENCOUNTER — Emergency Department (HOSPITAL_COMMUNITY)
Admission: EM | Admit: 2018-02-15 | Discharge: 2018-02-15 | Disposition: A | Discharge status: Home / Self Care | Payer: Medicaid Other | Attending: Emergency Medicine | Admitting: Emergency Medicine

## 2018-02-15 ENCOUNTER — Other Ambulatory Visit: Payer: Self-pay

## 2018-02-15 DIAGNOSIS — I10 Essential (primary) hypertension: Secondary | ICD-10-CM | POA: Insufficient documentation

## 2018-02-15 DIAGNOSIS — F17210 Nicotine dependence, cigarettes, uncomplicated: Secondary | ICD-10-CM | POA: Insufficient documentation

## 2018-02-15 DIAGNOSIS — E119 Type 2 diabetes mellitus without complications: Secondary | ICD-10-CM | POA: Insufficient documentation

## 2018-02-15 LAB — WET PREP, GENITAL
CLUE CELLS WET PREP: NONE SEEN
Sperm: NONE SEEN
TRICH WET PREP: NONE SEEN
YEAST WET PREP: NONE SEEN

## 2018-02-15 LAB — URINALYSIS, ROUTINE W REFLEX MICROSCOPIC
BACTERIA UA: NONE SEEN
BILIRUBIN URINE: NEGATIVE
Glucose, UA: >=500 mg/dL — AB
Hgb urine dipstick: NEGATIVE
KETONES UR: NEGATIVE mg/dL
NITRITE: NEGATIVE
PH: 6 (ref 5.0–8.0)
Protein, ur: NEGATIVE mg/dL
Specific Gravity, Urine: 1.027 (ref 1.005–1.030)

## 2018-02-15 LAB — CBG MONITORING, ED: GLUCOSE-CAPILLARY: 251 mg/dL — AB (ref 65–99)

## 2018-02-15 LAB — PREGNANCY, URINE: PREG TEST UR: NEGATIVE

## 2018-02-15 MED ORDER — METFORMIN HCL 500 MG PO TABS: 500.0000 mg | ORAL_TABLET | Freq: Two times a day (BID) | ORAL | 0 refills | Status: DC

## 2018-02-15 NOTE — ED Triage Notes (Signed)
Pt c/o itching and vaginal discharge, yellow in color x 3 days ago.

## 2018-02-15 NOTE — ED Notes (Signed)
Called back to [facility] room x 1 with no answer

## 2018-02-15 NOTE — Discharge Instructions (Addendum)
Your blood sugar is elevated.  This may be related to the symptoms that you are currently having.  Take the metformin as directed.  Call one of the clinics listed to establish primary care and to have your blood sugar rechecked.

## 2018-02-17 LAB — GC/CHLAMYDIA PROBE AMP (~~LOC~~) NOT AT ARMC
Chlamydia: NEGATIVE
NEISSERIA GONORRHEA: NEGATIVE

## 2018-02-17 NOTE — ED Provider Notes (Signed)
[facility] EMERGENCY DEPARTMENT Provider Note   CSN: [id number] Arrival date & time: 02/15/18  1509     History   Chief Complaint Chief Complaint  Patient presents with  . Vaginal Discharge    HPI Mallory Mcdowell is a 32 y.o. female.  HPI  Mallory Mcdowell is a 32 y.o. female who presents to the Emergency Department complaining of recurrent vaginal discharge and itching.  Symptoms have been present for 3 days.  Describes a slight yellow discharge, but having constant itching.  Denies bleeding, abdominal or pelvic pain, dysuria, rash and fever.  Symptoms are recurrent and same as previous visit here.   Past Medical History:  Diagnosis Date  . Diabetes mellitus without complication ([place])   . Hypertension     Patient Active Problem List   Diagnosis Date Noted  . Encounter for physical examination, contraception, and Papanicolaou smear 12/24/2016  . Family planning 12/24/2016  . Cigarette nicotine dependence without complication 41/93/7902  . Uncontrolled diabetes mellitus type 2 without complications ([place]) 40/97/3532  . Elevated platelet count 05/01/2016  . Alcohol abuse 05/01/2016    History reviewed. No pertinent surgical history.   OB History    Gravida  0   Para  0   Term  0   Preterm  0   AB  0   Living  0     SAB  0   TAB  0   Ectopic  0   Multiple  0   Live Births               Home Medications    Prior to Admission medications   Medication Sig Start Date End Date Taking? Authorizing Provider  metFORMIN (GLUCOPHAGE) 500 MG tablet Take 1 tablet (500 mg total) by mouth 2 (two) times daily with a meal. 02/15/18   Aedin Jeansonne, PA-C    Family History Family History  Problem Relation Age of Onset  . Asthma Mother   . Diabetes Other   . Heart failure Other   . Cancer Other     Social History Social History   Tobacco Use  . Smoking status: Current Every Day Smoker    Packs/day: 0.50    Types: Cigarettes  . Smokeless  tobacco: Never Used  Substance Use Topics  . Alcohol use: Yes    Alcohol/week: 0.6 oz    Types: 1 Cans of beer per week    Comment: sometimes daily, but not always  . Drug use: Yes    Types: Marijuana    Comment: marijuana last used 02/14/18     Allergies   Patient has no known allergies.   Review of Systems Review of Systems  Constitutional: Negative for activity change, appetite change, chills and fever.  Respiratory: Negative for chest tightness and shortness of breath.   Gastrointestinal: Negative for abdominal pain, nausea and vomiting.  Endocrine: Positive for polydipsia.  Genitourinary: Positive for vaginal discharge. Negative for decreased urine volume, difficulty urinating, dysuria, flank pain, frequency, hematuria, urgency and vaginal bleeding.  Musculoskeletal: Negative for back pain.  Skin: Negative for rash.  Neurological: Negative for dizziness, weakness and numbness.  Hematological: Negative for adenopathy.  Psychiatric/Behavioral: Negative for confusion.  All other systems reviewed and are negative.    Physical Exam Updated Vital Signs BP 132/88 (BP Location: Right Arm)   Pulse 72   Temp 98.4 F (36.9 C) (Oral)   Resp 17   Ht 5\' 5"  (1.651 m)   Wt 86.2 kg (190  lb)   LMP  (Within Weeks)   SpO2 99%   BMI 31.62 kg/m   Physical Exam  Constitutional: She is oriented to person, place, and time. She appears well-developed and well-nourished. No distress.  HENT:  Head: Atraumatic.  Mouth/Throat: Oropharynx is clear and moist.  Eyes: Pupils are equal, round, and reactive to light. EOM are normal.  Neck: Normal range of motion.  Cardiovascular: Normal rate and regular rhythm.  No murmur heard. Pulmonary/Chest: Effort normal and breath sounds normal.  Abdominal: Soft. She exhibits no distension and no mass. There is no tenderness. There is no guarding.  Genitourinary: Uterus normal. There is no rash or lesion on the right labia. There is no rash or lesion on  the left labia. Cervix exhibits no motion tenderness. Right adnexum displays no mass and no tenderness. Left adnexum displays no mass and no tenderness. No erythema or bleeding in the vagina. Vaginal discharge found.  Genitourinary Comments: Pelvic exam assisted by nursing.  Mild thin white vaginal discharge, no bleeding.  No CMT, no adnexal masses or tenderness.    Musculoskeletal: Normal range of motion.  Neurological: She is alert and oriented to person, place, and time. No sensory deficit.  Skin: Skin is warm. Capillary refill takes less than 2 seconds. No rash noted.  Nursing note and vitals reviewed.    ED Treatments / Results  Labs (all labs ordered are listed, but only abnormal results are displayed) Labs Reviewed  WET PREP, GENITAL - Abnormal; Notable for the following components:      Result Value   WBC, Wet Prep HPF POC RARE (*)    All other components within normal limits  URINALYSIS, ROUTINE W REFLEX MICROSCOPIC - Abnormal; Notable for the following components:   Color, Urine STRAW (*)    Glucose, UA >=500 (*)    Leukocytes, UA TRACE (*)    Squamous Epithelial / LPF 0-5 (*)    All other components within normal limits  CBG MONITORING, ED - Abnormal; Notable for the following components:   Glucose-Capillary 251 (*)    All other components within normal limits  PREGNANCY, URINE  GC/CHLAMYDIA PROBE AMP ([place]) NOT AT [facility]    EKG None  Radiology No results found.  Procedures Procedures (including critical care time)  Medications Ordered in ED Medications - No data to display   Initial Impression / Assessment and Plan / ED Course  I have reviewed the triage vital signs and the nursing notes.  Pertinent labs & imaging results that were available during my care of the patient were reviewed by me and considered in my medical decision making (see chart for details).     Pt sx's felt to be secondary to uncontrolled DM.  Pelvic exam without concerning  findings.  Cultures pending.  Discussed finding with pt.  She stopped taking her metformin due to loss of her PCP.  No concerning sx's for DKA.  Will refill her medication and [facility].  Appears safe for d/c home, agrees to plan.  Return precautions discussed.  Final Clinical Impressions(s) / ED Diagnoses   Final diagnoses:  Type 2 diabetes mellitus not at goal [facility])    ED Discharge Orders        Ordered    metFORMIN (GLUCOPHAGE) 500 MG tablet  2 times daily with meals     02/15/18 2113       Kem Parkinson, PA-C 02/17/18 2159    Orlie Dakin, MD 02/18/18 1356

## 2018-08-19 ENCOUNTER — Other Ambulatory Visit (HOSPITAL_COMMUNITY): Payer: Self-pay | Admitting: Nurse Practitioner

## 2018-08-19 DIAGNOSIS — N83202 Unspecified ovarian cyst, left side: Secondary | ICD-10-CM

## 2018-08-19 DIAGNOSIS — D259 Leiomyoma of uterus, unspecified: Secondary | ICD-10-CM

## 2018-08-24 ENCOUNTER — Ambulatory Visit (HOSPITAL_COMMUNITY)
Admission: RE | Admit: 2018-08-24 | Discharge: 2018-08-24 | Disposition: A | Discharge status: Home / Self Care | Payer: Self-pay | Source: Ambulatory Visit | Attending: Nurse Practitioner | Admitting: Nurse Practitioner

## 2018-08-24 DIAGNOSIS — D259 Leiomyoma of uterus, unspecified: Secondary | ICD-10-CM | POA: Insufficient documentation

## 2018-08-24 DIAGNOSIS — N83202 Unspecified ovarian cyst, left side: Secondary | ICD-10-CM | POA: Insufficient documentation

## 2018-11-29 ENCOUNTER — Other Ambulatory Visit: Payer: Self-pay

## 2018-11-29 ENCOUNTER — Emergency Department (HOSPITAL_COMMUNITY): Payer: Self-pay

## 2018-11-29 ENCOUNTER — Emergency Department (HOSPITAL_COMMUNITY)
Admission: EM | Admit: 2018-11-29 | Discharge: 2018-11-29 | Disposition: A | Discharge status: Home / Self Care | Payer: Self-pay | Attending: Emergency Medicine | Admitting: Emergency Medicine

## 2018-11-29 ENCOUNTER — Encounter (HOSPITAL_COMMUNITY): Payer: Self-pay | Admitting: Emergency Medicine

## 2018-11-29 DIAGNOSIS — Z7984 Long term (current) use of oral hypoglycemic drugs: Secondary | ICD-10-CM | POA: Insufficient documentation

## 2018-11-29 DIAGNOSIS — J209 Acute bronchitis, unspecified: Secondary | ICD-10-CM | POA: Insufficient documentation

## 2018-11-29 DIAGNOSIS — F17210 Nicotine dependence, cigarettes, uncomplicated: Secondary | ICD-10-CM | POA: Insufficient documentation

## 2018-11-29 DIAGNOSIS — Z720 Tobacco use: Secondary | ICD-10-CM

## 2018-11-29 DIAGNOSIS — I10 Essential (primary) hypertension: Secondary | ICD-10-CM | POA: Insufficient documentation

## 2018-11-29 DIAGNOSIS — E119 Type 2 diabetes mellitus without complications: Secondary | ICD-10-CM | POA: Insufficient documentation

## 2018-11-29 LAB — BASIC METABOLIC PANEL
Anion gap: 9 (ref 5–15)
BUN: 13 mg/dL (ref 6–20)
CALCIUM: 8.5 mg/dL — AB (ref 8.9–10.3)
CO2: 24 mmol/L (ref 22–32)
Chloride: 103 mmol/L (ref 98–111)
Creatinine, Ser: 0.62 mg/dL (ref 0.44–1.00)
GLUCOSE: 146 mg/dL — AB (ref 70–99)
Potassium: 3.4 mmol/L — ABNORMAL LOW (ref 3.5–5.1)
SODIUM: 136 mmol/L (ref 135–145)

## 2018-11-29 LAB — CBC
HEMATOCRIT: 37.4 % (ref 36.0–46.0)
HEMOGLOBIN: 12 g/dL (ref 12.0–15.0)
MCH: 27.8 pg (ref 26.0–34.0)
MCHC: 32.1 g/dL (ref 30.0–36.0)
MCV: 86.8 fL (ref 80.0–100.0)
NRBC: 0 % (ref 0.0–0.2)
Platelets: 393 10*3/uL (ref 150–400)
RBC: 4.31 MIL/uL (ref 3.87–5.11)
RDW: 13.3 % (ref 11.5–15.5)
WBC: 7.7 10*3/uL (ref 4.0–10.5)

## 2018-11-29 LAB — TROPONIN I: Troponin I: <0.03 ng/mL (ref <0.03)

## 2018-11-29 MED ORDER — DEXAMETHASONE SODIUM PHOSPHATE 10 MG/ML IJ SOLN
10.0000 mg | Freq: Once | INTRAMUSCULAR | Status: AC
  Administered 2018-11-29: 10 mg via INTRAMUSCULAR
  Filled 2018-11-29: qty 1

## 2018-11-29 MED ORDER — KETOROLAC TROMETHAMINE 30 MG/ML IJ SOLN
30.0000 mg | Freq: Once | INTRAMUSCULAR | Status: AC
  Administered 2018-11-29: 30 mg via INTRAMUSCULAR
  Filled 2018-11-29: qty 1

## 2018-11-29 MED ORDER — AEROCHAMBER PLUS FLO-VU MEDIUM MISC
1.0000 | Freq: Once | Status: AC
  Administered 2018-11-29: 1

## 2018-11-29 MED ORDER — AZITHROMYCIN 250 MG PO TABS
500.0000 mg | ORAL_TABLET | Freq: Once | ORAL | Status: AC
  Administered 2018-11-29: 500 mg via ORAL
  Filled 2018-11-29: qty 2

## 2018-11-29 MED ORDER — AZITHROMYCIN 250 MG PO TABS: ORAL_TABLET | ORAL | 0 refills | Status: DC

## 2018-11-29 MED ORDER — OXYMETAZOLINE HCL 0.05 % NA SOLN
1.0000 | Freq: Once | NASAL | Status: AC
  Administered 2018-11-29: 1 via NASAL
  Filled 2018-11-29: qty 15

## 2018-11-29 MED ORDER — ALBUTEROL SULFATE HFA 108 (90 BASE) MCG/ACT IN AERS
1.0000 | INHALATION_SPRAY | RESPIRATORY_TRACT | Status: DC | PRN
  Administered 2018-11-29: 2 via RESPIRATORY_TRACT
  Filled 2018-11-29: qty 6.7

## 2018-11-29 MED ORDER — IBUPROFEN 600 MG PO TABS: 600.0000 mg | ORAL_TABLET | Freq: Four times a day (QID) | ORAL | 0 refills | Status: DC | PRN

## 2018-11-29 NOTE — ED Provider Notes (Signed)
[facility] EMERGENCY DEPARTMENT Provider Note   CSN: [id number] Arrival date & time: 11/29/18  1819     History   Chief Complaint Chief Complaint  Patient presents with  . Cough    HPI Mallory Mcdowell is a 33 y.o. female.  Pt presents to the ED today with a 3 week hx of cough.  The pt also c/o sinus congestion.  She's taken several otc cough meds without improvement in sx.  She does smoke.     Past Medical History:  Diagnosis Date  . Diabetes mellitus without complication ([place])   . Hypertension     Patient Active Problem List   Diagnosis Date Noted  . Encounter for physical examination, contraception, and Papanicolaou smear 12/24/2016  . Family planning 12/24/2016  . Cigarette nicotine dependence without complication 40/98/1191  . Uncontrolled diabetes mellitus type 2 without complications ([place]) 47/82/9562  . Elevated platelet count 05/01/2016  . Alcohol abuse 05/01/2016    History reviewed. No pertinent surgical history.   OB History    Gravida  0   Para  0   Term  0   Preterm  0   AB  0   Living  0     SAB  0   TAB  0   Ectopic  0   Multiple  0   Live Births               Home Medications    Prior to Admission medications   Medication Sig Start Date End Date Taking? Authorizing Provider  azithromycin (ZITHROMAX) 250 MG tablet Take 1 every day until finished. 11/30/18   Isla Pence, MD  ibuprofen (ADVIL,MOTRIN) 600 MG tablet Take 1 tablet (600 mg total) by mouth every 6 (six) hours as needed. 11/29/18   Isla Pence, MD  metFORMIN (GLUCOPHAGE) 500 MG tablet Take 1 tablet (500 mg total) by mouth 2 (two) times daily with a meal. 02/15/18   Triplett, Tammy, PA-C    Family History Family History  Problem Relation Age of Onset  . Asthma Mother   . Diabetes Other   . Heart failure Other   . Cancer Other     Social History Social History   Tobacco Use  . Smoking status: Current Every Day Smoker    Packs/day: 0.50   Types: Cigarettes  . Smokeless tobacco: Never Used  Substance Use Topics  . Alcohol use: Yes    Alcohol/week: 1.0 standard drinks    Types: 1 Cans of beer per week    Comment: sometimes daily, but not always  . Drug use: Yes    Types: Marijuana    Comment: marijuana last used 02/14/18     Allergies   Patient has no known allergies.   Review of Systems Review of Systems  HENT: Positive for congestion.   Respiratory: Positive for cough.   All other systems reviewed and are negative.    Physical Exam Updated Vital Signs BP (!) 149/97 (BP Location: Right Arm)   Pulse (!) 103   Temp 98.1 F (36.7 C) (Oral)   Resp 20   Ht 5\' 4"  (1.626 m)   Wt 81.6 kg   SpO2 100%   BMI 30.90 kg/m   Physical Exam Vitals signs and nursing note reviewed.  Constitutional:      Appearance: Normal appearance.  HENT:     Head: Normocephalic and atraumatic.     Right Ear: External ear normal.     Left Ear: External ear normal.  Nose: Congestion present.     Mouth/Throat:     Mouth: Mucous membranes are moist.     Pharynx: Oropharynx is clear.  Eyes:     Extraocular Movements: Extraocular movements intact.     Conjunctiva/sclera: Conjunctivae normal.     Pupils: Pupils are equal, round, and reactive to light.  Neck:     Musculoskeletal: Normal range of motion.  Cardiovascular:     Rate and Rhythm: Normal rate and regular rhythm.  Pulmonary:     Breath sounds: Wheezing present.  Abdominal:     General: Abdomen is flat. Bowel sounds are normal.     Palpations: Abdomen is soft.  Musculoskeletal: Normal range of motion.  Skin:    General: Skin is warm and dry.     Capillary Refill: Capillary refill takes less than 2 seconds.  Neurological:     General: No focal deficit present.     Mental Status: She is alert and oriented to person, place, and time.  Psychiatric:        Mood and Affect: Mood normal.        Behavior: Behavior normal.        Thought Content: Thought content normal.         Judgment: Judgment normal.      ED Treatments / Results  Labs (all labs ordered are listed, but only abnormal results are displayed) Labs Reviewed  BASIC METABOLIC PANEL - Abnormal; Notable for the following components:      Result Value   Potassium 3.4 (*)    Glucose, Bld 146 (*)    Calcium 8.5 (*)    All other components within normal limits  CBC  TROPONIN I    EKG EKG Interpretation  Date/Time:  Tuesday November 29 2018 19:03:57 EST Ventricular Rate:  100 PR Interval:  124 QRS Duration: 70 QT Interval:  334 QTC Calculation: 430 R Axis:   49 Text Interpretation:  Normal sinus rhythm Normal ECG Confirmed by Isla Pence [phone]) on 11/29/2018 8:45:58 PM   Radiology Dg Chest 2 View  Result Date: 11/29/2018 CLINICAL DATA:  33 year old female with chest pain. EXAM: CHEST - 2 VIEW COMPARISON:  Chest radiograph dated 05/05/2017 FINDINGS: The heart size and mediastinal contours are within normal limits. Both lungs are clear. The visualized skeletal structures are unremarkable. IMPRESSION: No active cardiopulmonary disease. Electronically Signed   By: Anner Crete M.D.   On: 11/29/2018 19:37    Procedures Procedures (including critical care time)  Medications Ordered in ED Medications  dexamethasone (DECADRON) injection 10 mg (has no administration in time range)  ketorolac (TORADOL) 30 MG/ML injection 30 mg (has no administration in time range)  albuterol (PROVENTIL HFA;VENTOLIN HFA) 108 (90 Base) MCG/ACT inhaler 1-2 puff (has no administration in time range)  AEROCHAMBER PLUS FLO-VU MEDIUM MISC 1 each (has no administration in time range)  azithromycin (ZITHROMAX) tablet 500 mg (has no administration in time range)  oxymetazoline (AFRIN) 0.05 % nasal spray 1 spray (has no administration in time range)     Initial Impression / Assessment and Plan / ED Course  I have reviewed the triage vital signs and the nursing notes.  Pertinent labs & imaging results  that were available during my care of the patient were reviewed by me and considered in my medical decision making (see chart for details).  Pt does have some wheezing, but O2 sat is 100%.  She is given an albuterol inhaler and spacer prior to d/c.  She is also given  an IM dose of toradol and decadron.  She will be started on zithromax as sx have been going on for 3 weeks.  She is encouraged to stop smoking.  Return if worse.  F/u with pcp.  Final Clinical Impressions(s) / ED Diagnoses   Final diagnoses:  Acute bronchitis, unspecified organism  Tobacco abuse    ED Discharge Orders         Ordered    azithromycin (ZITHROMAX) 250 MG tablet     11/29/18 2056    ibuprofen (ADVIL,MOTRIN) 600 MG tablet  Every 6 hours PRN     11/29/18 2056           Isla Pence, MD 11/29/18 2059

## 2018-11-29 NOTE — ED Notes (Signed)
Pt called twice no answer

## 2018-11-29 NOTE — ED Triage Notes (Signed)
Pt c/o of chest pain x 3 days with dry cough and nasal drainage.

## 2018-11-29 NOTE — Discharge Instructions (Signed)
Try to stop smoking. °

## 2018-12-03 ENCOUNTER — Other Ambulatory Visit: Payer: Self-pay

## 2018-12-03 ENCOUNTER — Emergency Department (HOSPITAL_COMMUNITY)
Admission: EM | Admit: 2018-12-03 | Discharge: 2018-12-03 | Disposition: A | Discharge status: Home / Self Care | Payer: Self-pay | Attending: Emergency Medicine | Admitting: Emergency Medicine

## 2018-12-03 ENCOUNTER — Encounter (HOSPITAL_COMMUNITY): Payer: Self-pay | Admitting: *Deleted

## 2018-12-03 DIAGNOSIS — I10 Essential (primary) hypertension: Secondary | ICD-10-CM

## 2018-12-03 DIAGNOSIS — F17210 Nicotine dependence, cigarettes, uncomplicated: Secondary | ICD-10-CM | POA: Insufficient documentation

## 2018-12-03 DIAGNOSIS — Z9114 Patient's other noncompliance with medication regimen: Secondary | ICD-10-CM

## 2018-12-03 DIAGNOSIS — E119 Type 2 diabetes mellitus without complications: Secondary | ICD-10-CM | POA: Insufficient documentation

## 2018-12-03 DIAGNOSIS — M545 Low back pain: Secondary | ICD-10-CM | POA: Insufficient documentation

## 2018-12-03 DIAGNOSIS — Z91148 Patient's other noncompliance with medication regimen for other reason: Secondary | ICD-10-CM

## 2018-12-03 DIAGNOSIS — M549 Dorsalgia, unspecified: Secondary | ICD-10-CM

## 2018-12-03 DIAGNOSIS — Z79899 Other long term (current) drug therapy: Secondary | ICD-10-CM | POA: Insufficient documentation

## 2018-12-03 LAB — URINALYSIS, ROUTINE W REFLEX MICROSCOPIC
BILIRUBIN URINE: NEGATIVE
GLUCOSE, UA: 50 mg/dL — AB
Hgb urine dipstick: NEGATIVE
KETONES UR: NEGATIVE mg/dL
Leukocytes, UA: NEGATIVE
NITRITE: NEGATIVE
PROTEIN: NEGATIVE mg/dL
Specific Gravity, Urine: 1.023 (ref 1.005–1.030)
pH: 5 (ref 5.0–8.0)

## 2018-12-03 LAB — CBG MONITORING, ED: Glucose-Capillary: 185 mg/dL — ABNORMAL HIGH (ref 70–99)

## 2018-12-03 LAB — PREGNANCY, URINE: Preg Test, Ur: NEGATIVE

## 2018-12-03 MED ORDER — CYCLOBENZAPRINE HCL 10 MG PO TABS: 10.0000 mg | ORAL_TABLET | Freq: Two times a day (BID) | ORAL | 0 refills | Status: DC | PRN

## 2018-12-03 MED ORDER — CYCLOBENZAPRINE HCL 10 MG PO TABS
5.0000 mg | ORAL_TABLET | Freq: Once | ORAL | Status: AC
  Administered 2018-12-03: 5 mg via ORAL
  Filled 2018-12-03: qty 1

## 2018-12-03 MED ORDER — OXYCODONE-ACETAMINOPHEN 5-325 MG PO TABS
2.0000 | ORAL_TABLET | Freq: Once | ORAL | Status: AC
  Administered 2018-12-03: 2 via ORAL
  Filled 2018-12-03: qty 2

## 2018-12-03 MED ORDER — HYDROCHLOROTHIAZIDE 25 MG PO TABS: 25.0000 mg | ORAL_TABLET | Freq: Every day | ORAL | 0 refills | Status: DC

## 2018-12-03 NOTE — ED Notes (Addendum)
Pt ambulated to bathroom independently to provide UA.

## 2018-12-03 NOTE — ED Notes (Signed)
ED Provider at bedside. 

## 2018-12-03 NOTE — ED Provider Notes (Signed)
[facility] EMERGENCY DEPARTMENT Provider Note   CSN: [id number] Arrival date & time: 12/03/18  1537     History   Chief Complaint Chief Complaint  Patient presents with  . Back Pain    HPI Mallory Mcdowell is a 33 y.o. female.  HPI  33 year old female presents today complaining of left low back pain.  She states that it began during the night.  It woke her up and is sharp and increases with movement.  She has no known trauma to the area.  She has recently been seen and diagnosed with bronchitis.  States she has been having cough for the past month however she is currently not dyspneic.  Was told to stop smoking and states that she quit smoking cigarettes but has continued to smoke weed.  She denies fever, chills, headache, head injury.  She said when the pain began that it worsened when she urinated.  However it is much more severe when she moves around.  She denies any history of kidney stones.  She has had some urinary frequency.  She is a type II diabetic but does not follow her blood sugars regularly.  She also hypertension but has not been taking her antihypertensives.  She states that she has never been pregnant is not currently using birth control.  Her last menstrual cycle was January 7 and was normal.  A normal time.  Past Medical History:  Diagnosis Date  . Diabetes mellitus without complication ([place])   . Hypertension     Patient Active Problem List   Diagnosis Date Noted  . Encounter for physical examination, contraception, and Papanicolaou smear 12/24/2016  . Family planning 12/24/2016  . Cigarette nicotine dependence without complication 79/12/4095  . Uncontrolled diabetes mellitus type 2 without complications ([place]) 35/32/9924  . Elevated platelet count 05/01/2016  . Alcohol abuse 05/01/2016    History reviewed. No pertinent surgical history.   OB History    Gravida  0   Para  0   Term  0   Preterm  0   AB  0   Living  0     SAB    0   TAB  0   Ectopic  0   Multiple  0   Live Births               Home Medications    Prior to Admission medications   Medication Sig Start Date End Date Taking? Authorizing Provider  azithromycin (ZITHROMAX) 250 MG tablet Take 1 every day until finished. 11/30/18   Isla Pence, MD  ibuprofen (ADVIL,MOTRIN) 600 MG tablet Take 1 tablet (600 mg total) by mouth every 6 (six) hours as needed. 11/29/18   Isla Pence, MD  metFORMIN (GLUCOPHAGE) 500 MG tablet Take 1 tablet (500 mg total) by mouth 2 (two) times daily with a meal. 02/15/18   Triplett, Tammy, PA-C    Family History Family History  Problem Relation Age of Onset  . Asthma Mother   . Diabetes Other   . Heart failure Other   . Cancer Other     Social History Social History   Tobacco Use  . Smoking status: Current Every Day Smoker    Packs/day: 0.50    Types: Cigarettes  . Smokeless tobacco: Never Used  Substance Use Topics  . Alcohol use: Yes    Alcohol/week: 1.0 standard drinks    Types: 1 Cans of beer per week    Comment: sometimes daily, but not always  .  Drug use: Yes    Types: Marijuana    Comment: marijuana last used 02/14/18     Allergies   Patient has no known allergies.   Review of Systems Review of Systems  All other systems reviewed and are negative.    Physical Exam Updated Vital Signs BP (!) 162/110 (BP Location: Right Arm)   Pulse (!) 101   Temp 98.4 F (36.9 C) (Oral)   Resp 17   Ht 1.626 m (5\' 4" )   Wt 81.6 kg   SpO2 100%   BMI 30.90 kg/m   Physical Exam Vitals signs and nursing note reviewed.  Constitutional:      General: She is not in acute distress.    Appearance: Normal appearance. She is normal weight. She is not ill-appearing or toxic-appearing.  HENT:     Head: Normocephalic and atraumatic.     Right Ear: Tympanic membrane normal.     Left Ear: Tympanic membrane normal.     Nose: Nose normal.     Mouth/Throat:     Mouth: Mucous membranes are moist.      Pharynx: Oropharynx is clear.  Eyes:     Pupils: Pupils are equal, round, and reactive to light.  Neck:     Musculoskeletal: Normal range of motion.  Cardiovascular:     Rate and Rhythm: Normal rate and regular rhythm.     Pulses: Normal pulses.     Heart sounds: Normal heart sounds.  Pulmonary:     Effort: Pulmonary effort is normal.     Breath sounds: Normal breath sounds.  Abdominal:     General: Abdomen is flat.     Palpations: Abdomen is soft.  Musculoskeletal:     Comments: No deformity or abnormality of extremities Back examined with no signs of trauma There is some tenderness over the left superior gluteal region.  This is where the patient identifies her pain from.  Skin:    General: Skin is warm and dry.     Capillary Refill: Capillary refill takes less than 2 seconds.  Neurological:     General: No focal deficit present.     Mental Status: She is alert and oriented to person, place, and time. Mental status is at baseline.     Cranial Nerves: No cranial nerve deficit.     Sensory: No sensory deficit.     Motor: No weakness.     Coordination: Coordination normal.     Gait: Gait normal.     Deep Tendon Reflexes: Reflexes normal.  Psychiatric:        Mood and Affect: Mood normal.      ED Treatments / Results  Labs (all labs ordered are listed, but only abnormal results are displayed) Labs Reviewed - No data to display  EKG None  Radiology No results found.  Procedures Procedures (including critical care time)  Medications Ordered in ED Medications - No data to display   Initial Impression / Assessment and Plan / ED Course  I have reviewed the triage vital signs and the nursing notes.  Pertinent labs & imaging results that were available during my care of the patient were reviewed by me and considered in my medical decision making (see chart for details).      This is a 33 year old type II diabetic who presents today with some gluteal pain that  seems musculoskeletal.  She had urine checked that she also complained of some frequency and pain with urination.  Her urine is clear with  no hematuria or evidence of infection.  Her pregnancy test is negative.  Her blood sugar is 185.  She is given Percocet and Flexeril here for pain.  She is advised to nonsteroidal anti-inflammatories at home and is given prescription for Flexeril.  Has not been taking her blood pressure medicine and is quite hypertensive.  She will be given a prescription for her blood pressure medication and is advised regarding need for follow-up.  We have discussed return precautions and need for outpatient follow-up for visit today.  Smoking cessation of all inhaled products discussed  Final Clinical Impressions(s) / ED Diagnoses   Final diagnoses:  Musculoskeletal back pain  Hypertension, unspecified type  Noncompliance with medication regimen    ED Discharge Orders    None       Pattricia Boss, MD 12/03/18 1655

## 2018-12-03 NOTE — ED Triage Notes (Signed)
PT reports low back pain and reports she has a Kidney infection. Pt denies any dysuria

## 2018-12-03 NOTE — Discharge Instructions (Addendum)
Please begin taking hydrochlorothiazide for your high blood pressure and have your high blood pressure rechecked next week Your back pain appears musculoskeletal.  Urinalysis showed no evidence of infection or red blood cells.  You are given a prescription for Flexeril for pain.

## 2019-04-12 ENCOUNTER — Other Ambulatory Visit: Payer: Self-pay

## 2019-04-12 ENCOUNTER — Encounter (HOSPITAL_COMMUNITY): Payer: Self-pay | Admitting: Emergency Medicine

## 2019-04-12 ENCOUNTER — Emergency Department (HOSPITAL_COMMUNITY)
Admission: EM | Admit: 2019-04-12 | Discharge: 2019-04-12 | Disposition: A | Discharge status: Home / Self Care | Payer: Medicaid Other | Attending: Emergency Medicine | Admitting: Emergency Medicine

## 2019-04-12 DIAGNOSIS — N760 Acute vaginitis: Secondary | ICD-10-CM | POA: Insufficient documentation

## 2019-04-12 DIAGNOSIS — R300 Dysuria: Secondary | ICD-10-CM | POA: Insufficient documentation

## 2019-04-12 DIAGNOSIS — I10 Essential (primary) hypertension: Secondary | ICD-10-CM

## 2019-04-12 DIAGNOSIS — Z79899 Other long term (current) drug therapy: Secondary | ICD-10-CM | POA: Insufficient documentation

## 2019-04-12 DIAGNOSIS — E119 Type 2 diabetes mellitus without complications: Secondary | ICD-10-CM | POA: Insufficient documentation

## 2019-04-12 DIAGNOSIS — Z202 Contact with and (suspected) exposure to infections with a predominantly sexual mode of transmission: Secondary | ICD-10-CM | POA: Insufficient documentation

## 2019-04-12 DIAGNOSIS — B9689 Other specified bacterial agents as the cause of diseases classified elsewhere: Secondary | ICD-10-CM

## 2019-04-12 DIAGNOSIS — F17210 Nicotine dependence, cigarettes, uncomplicated: Secondary | ICD-10-CM | POA: Insufficient documentation

## 2019-04-12 DIAGNOSIS — N898 Other specified noninflammatory disorders of vagina: Secondary | ICD-10-CM | POA: Insufficient documentation

## 2019-04-12 LAB — URINALYSIS, ROUTINE W REFLEX MICROSCOPIC
Bilirubin Urine: NEGATIVE
Glucose, UA: NEGATIVE mg/dL
Hgb urine dipstick: NEGATIVE
Ketones, ur: NEGATIVE mg/dL
Leukocytes,Ua: NEGATIVE
Nitrite: NEGATIVE
Protein, ur: NEGATIVE mg/dL
Specific Gravity, Urine: 1.016 (ref 1.005–1.030)
pH: 5 (ref 5.0–8.0)

## 2019-04-12 LAB — WET PREP, GENITAL
Sperm: NONE SEEN
Trich, Wet Prep: NONE SEEN
Yeast Wet Prep HPF POC: NONE SEEN

## 2019-04-12 LAB — PREGNANCY, URINE: Preg Test, Ur: NEGATIVE

## 2019-04-12 LAB — CBG MONITORING, ED: Glucose-Capillary: 171 mg/dL — ABNORMAL HIGH (ref 70–99)

## 2019-04-12 MED ORDER — CEFTRIAXONE SODIUM 250 MG IJ SOLR
250.0000 mg | Freq: Once | INTRAMUSCULAR | Status: DC
  Filled 2019-04-12: qty 250

## 2019-04-12 MED ORDER — HYDROCHLOROTHIAZIDE 25 MG PO TABS: 25.0000 mg | ORAL_TABLET | Freq: Every day | ORAL | 0 refills | Status: DC

## 2019-04-12 MED ORDER — ONDANSETRON 4 MG PO TBDP
4.0000 mg | ORAL_TABLET | Freq: Once | ORAL | Status: DC
  Filled 2019-04-12: qty 1

## 2019-04-12 MED ORDER — HYDROCHLOROTHIAZIDE 25 MG PO TABS
25.0000 mg | ORAL_TABLET | Freq: Every day | ORAL | Status: DC
  Administered 2019-04-12: 25 mg via ORAL
  Filled 2019-04-12: qty 1

## 2019-04-12 MED ORDER — AZITHROMYCIN 250 MG PO TABS
1000.0000 mg | ORAL_TABLET | Freq: Once | ORAL | Status: DC
  Filled 2019-04-12: qty 4

## 2019-04-12 MED ORDER — METRONIDAZOLE 500 MG PO TABS: 500.0000 mg | ORAL_TABLET | Freq: Two times a day (BID) | ORAL | 0 refills | Status: DC

## 2019-04-12 NOTE — ED Provider Notes (Signed)
[facility] EMERGENCY DEPARTMENT Provider Note   CSN: [id number] Arrival date & time: 04/12/19  1710    History   Chief Complaint Chief Complaint  Patient presents with  . Urinary Tract Infection    HPI Mallory Mcdowell is a 33 y.o. female with a PMH of Diabetes and HTN presenting with dysuria, urinary frequency, and pruritus onset 3 days ago. Patient reports she has not tried any treatments. Patient states nothing makes symptoms better or worse. Patient reports vaginal discharge. Patient states she is sexually active and does not use protection. LMP on 04/03/2019. Patient denies fever, chills, nausea, vomiting, abdominal pain, diarrhea, chest pain, shortness of breath, headache, dizziness, or vision changes. Patient is requesting to be tested for STDs. Patient reports she has not been compliant with her medications and needs a refill for her blood pressure medications.      HPI  Past Medical History:  Diagnosis Date  . Diabetes mellitus without complication ([place])   . Hypertension     Patient Active Problem List   Diagnosis Date Noted  . Encounter for physical examination, contraception, and Papanicolaou smear 12/24/2016  . Family planning 12/24/2016  . Cigarette nicotine dependence without complication 46/56/8127  . Uncontrolled diabetes mellitus type 2 without complications ([place]) 51/70/0174  . Elevated platelet count 05/01/2016  . Alcohol abuse 05/01/2016    History reviewed. No pertinent surgical history.   OB History    Gravida  0   Para  0   Term  0   Preterm  0   AB  0   Living  0     SAB  0   TAB  0   Ectopic  0   Multiple  0   Live Births               Home Medications    Prior to Admission medications   Medication Sig Start Date End Date Taking? Authorizing Provider  cyclobenzaprine (FLEXERIL) 10 MG tablet Take 1 tablet (10 mg total) by mouth 2 (two) times daily as needed for muscle spasms. 12/03/18  Yes Pattricia Boss, MD  ibuprofen  (ADVIL,MOTRIN) 600 MG tablet Take 1 tablet (600 mg total) by mouth every 6 (six) hours as needed. 11/29/18  Yes Isla Pence, MD  hydrochlorothiazide (HYDRODIURIL) 25 MG tablet Take 1 tablet (25 mg total) by mouth daily. 04/12/19   Darlin Drop P, PA-C  metFORMIN (GLUCOPHAGE) 500 MG tablet Take 1 tablet (500 mg total) by mouth 2 (two) times daily with a meal. Patient not taking: Reported on 04/12/2019 02/15/18   Triplett, Tammy, PA-C  metroNIDAZOLE (FLAGYL) 500 MG tablet Take 1 tablet (500 mg total) by mouth 2 (two) times daily. 04/12/19   Arville Lime, PA-C    Family History Family History  Problem Relation Age of Onset  . Asthma Mother   . Diabetes Other   . Heart failure Other   . Cancer Other     Social History Social History   Tobacco Use  . Smoking status: Current Every Day Smoker    Packs/day: 0.50    Types: Cigarettes  . Smokeless tobacco: Never Used  Substance Use Topics  . Alcohol use: Yes    Alcohol/week: 1.0 standard drinks    Types: 1 Cans of beer per week    Comment: sometimes daily, but not always  . Drug use: Yes    Types: Marijuana    Comment: marijuana last used 02/14/18     Allergies   Patient has  no known allergies.   Review of Systems Review of Systems  Constitutional: Negative for activity change, appetite change, chills and fever.  Eyes: Negative for visual disturbance.  Respiratory: Negative for cough and shortness of breath.   Cardiovascular: Negative for chest pain.  Gastrointestinal: Negative for abdominal pain, nausea and vomiting.  Genitourinary: Positive for dysuria, frequency and vaginal discharge. Negative for flank pain, genital sores, hematuria, pelvic pain, vaginal bleeding and vaginal pain.  Musculoskeletal: Negative for arthralgias.  Skin: Negative for rash and wound.  Neurological: Negative for dizziness, syncope, weakness, numbness and headaches.     Physical Exam Updated Vital Signs BP (!) 156/111 (BP Location: Left Arm)    Pulse 87   Temp 98.2 F (36.8 C) (Oral)   Resp 16   Ht 5\' 5"  (1.651 m)   Wt 81.6 kg   SpO2 98%   BMI 29.95 kg/m   Physical Exam Vitals signs and nursing note reviewed. Exam conducted with a chaperone present.  Constitutional:      General: She is not in acute distress.    Appearance: She is well-developed. She is not diaphoretic.  HENT:     Head: Normocephalic and atraumatic.  Neck:     Musculoskeletal: Normal range of motion.  Cardiovascular:     Rate and Rhythm: Normal rate and regular rhythm.     Heart sounds: Normal heart sounds. No murmur. No friction rub. No gallop.   Pulmonary:     Effort: Pulmonary effort is normal. No respiratory distress.     Breath sounds: Normal breath sounds. No wheezing or rales.  Abdominal:     Palpations: Abdomen is soft.     Tenderness: There is no abdominal tenderness.  Genitourinary:    Pubic Area: No rash.      Labia:        Right: No rash, tenderness or lesion.        Left: No rash, tenderness or lesion.      Vagina: No signs of injury. Vaginal discharge present. No erythema, tenderness or bleeding.     Cervix: No cervical motion tenderness, discharge, lesion, erythema or cervical bleeding.     Uterus: Normal.      Adnexa: Right adnexa normal and left adnexa normal.       Right: No mass or tenderness.         Left: No mass or tenderness.       Comments: Small amount of thick white discharge noted on vaginal wall. No bleeding, erythema, or lesions noted. No CMT. No rashes or signs of injury noted. Musculoskeletal: Normal range of motion.  Skin:    General: Skin is warm.     Findings: No erythema or rash.  Neurological:     Mental Status: She is alert.      ED Treatments / Results  Labs (all labs ordered are listed, but only abnormal results are displayed) Labs Reviewed  WET PREP, GENITAL - Abnormal; Notable for the following components:      Result Value   Clue Cells Wet Prep HPF POC PRESENT (*)    WBC, Wet Prep HPF POC  MODERATE (*)    All other components within normal limits  CBG MONITORING, ED - Abnormal; Notable for the following components:   Glucose-Capillary 171 (*)    All other components within normal limits  URINALYSIS, ROUTINE W REFLEX MICROSCOPIC  PREGNANCY, URINE  RPR  HIV ANTIBODY (ROUTINE TESTING W REFLEX)  GC/CHLAMYDIA PROBE AMP ([place]) NOT AT [facility]  EKG None  Radiology No results found.  Procedures Procedures (including critical care time)  Medications Ordered in ED Medications  hydrochlorothiazide (HYDRODIURIL) tablet 25 mg (25 mg Oral Given 04/12/19 1839)  cefTRIAXone (ROCEPHIN) injection 250 mg (250 mg Intramuscular Refused 04/12/19 1930)  azithromycin (ZITHROMAX) tablet 1,000 mg (1,000 mg Oral Refused 04/12/19 1930)  ondansetron (ZOFRAN-ODT) disintegrating tablet 4 mg (4 mg Oral Refused 04/12/19 1930)     Initial Impression / Assessment and Plan / ED Course  I have reviewed the triage vital signs and the nursing notes.  Pertinent labs & imaging results that were available during my care of the patient were reviewed by me and considered in my medical decision making (see chart for details).  Clinical Course as of Apr 11 1933  Wed Apr 12, 2019  1855 Moderate WBCs and Clue cells present on wet prep.   WBC, Wet Prep HPF POC(!): MODERATE [AH]  1902 UA is negative for nitrite, leukocytes, glucose, or ketones.  Urinalysis, Routine w reflex microscopic [AH]    Clinical Course User Index [AH] Arville Lime, PA-C      Patient presents with dysuria, frequency, and vaginal discharge. UA is negative for signs of UTI. Pelvic exam reveals vaginal discharge. Pt presents with concerns for possible STD.  Pt understands that they have GC/Chlamydia cultures pending and that they will need to inform all sexual partners if results return positive. Ordered antibiotics per patient's request to be treated prophylactically with azithromycin and Rocephin due to pts history, pelvic exam,  and wet prep with increased WBCs. Pt not concerning for PID because hemodynamically stable and no cervical motion tenderness on pelvic exam. Pt has also prescribed Flagyl for Bacterial Vaginosis. Pt has been advised to not drink alcohol while on this medication.   Discussed elevated BP during today's visit. Provided home medications in the ER. Patient denies any symptoms of hypertensive crisis. Prescribed a refill blood pressure medications. Advised patient to follow up with PCP.   Patient eloped without notifying nurse, provider, or any other staff prior to receiving medications, prescriptions, and documents.  Final Clinical Impressions(s) / ED Diagnoses   Final diagnoses:  Dysuria  BV (bacterial vaginosis)  Essential hypertension  Possible exposure to STD    ED Discharge Orders         Ordered    metroNIDAZOLE (FLAGYL) 500 MG tablet  2 times daily     04/12/19 1926    hydrochlorothiazide (HYDRODIURIL) 25 MG tablet  Daily     04/12/19 1927           Arville Lime, [place] 04/12/19 [place] Octave, MD 04/13/19 1232

## 2019-04-12 NOTE — ED Triage Notes (Signed)
Pt c/o of burning with urination, itching and increased frequency x 3 days

## 2019-04-12 NOTE — ED Notes (Signed)
Pt asking about discharge papers.. RN informed pt that that edp ordered meds and RN will be in shortly

## 2019-04-12 NOTE — Discharge Instructions (Addendum)
You have been seen today for pain with urination. Please read and follow all provided instructions.   1. Medications: Hydrochlorothiazide (for blood pressure), metronidazole (antibiotic for bacterial vaginosis - do not drink alcohol while taking this antibiotic as it can make you very sick), usual home medications 2. Treatment: rest, drink plenty of fluids 3. Follow Up: Please follow up with your primary doctor in 2 days for discussion of your diagnoses and further evaluation after today's visit; if you do not have a primary care doctor use the resource guide provided to find one; Please return to the ER for any new or worsening symptoms. Please obtain all of your results from medical records or have your doctors office obtain the results - share them with your doctor - you should be seen at your doctors office. Call today to arrange your follow up.   Take medications as prescribed. Please review all of the medicines and only take them if you do not have an allergy to them. Return to the emergency room for worsening condition or new concerning symptoms. Follow up with your regular doctor. If you don't have a regular doctor use one of the numbers below to establish a primary care doctor.  Please be aware that if you are taking birth control pills, taking other prescriptions, ESPECIALLY ANTIBIOTICS may make the birth control ineffective - if this is the case, either do not engage in sexual activity or use alternative methods of birth control such as condoms until you have finished the medicine and your family doctor says it is OK to restart them. If you are on a blood thinner such as COUMADIN, be aware that any other medicine that you take may cause the coumadin to either work too much, or not enough - you should have your coumadin level rechecked in next 7 days if this is the case.  ?  It is also a possibility that you have an allergic reaction to any of the medicines that you have been prescribed -  Everybody reacts differently to medications and while MOST people have no trouble with most medicines, you may have a reaction such as nausea, vomiting, rash, swelling, shortness of breath. If this is the case, please stop taking the medicine immediately and contact your physician.  ?  You should return to the ER if you develop severe or worsening symptoms.   Emergency Department Resource Guide 1) Find a Doctor and Pay Out of Pocket Although you won't have to find out who is covered by your insurance plan, it is a good idea to ask around and get recommendations. You will then need to call the office and see if the doctor you have chosen will accept you as a new patient and what types of options they offer for patients who are self-pay. Some doctors offer discounts or will set up payment plans for their patients who do not have insurance, but you will need to ask so you aren't surprised when you get to your appointment.  2) Contact Your Local Health Department Not all health departments have doctors that can see patients for sick visits, but many do, so it is worth a call to see if yours does. If you don't know where your local health department is, you can check in your phone book. The CDC also has a tool to help you locate your state's health department, and many state websites also have listings of all of their local health departments.  3) Find a [facility] If  your illness is not likely to be very severe or complicated, you may want to try a walk in clinic. These are popping up all over the country in pharmacies, drugstores, and shopping centers. They're usually staffed by nurse practitioners or physician assistants that have been trained to treat common illnesses and complaints. They're usually fairly quick and inexpensive. However, if you have serious medical issues or chronic medical problems, these are probably not your best option.  No Primary Care Doctor: Call Health Connect at  [phone]   - they can help you locate a primary care doctor that  accepts your insurance, provides certain services, etc. Physician Referral Service- [phone]  Chronic Pain Problems: Organization         Address  Phone   Notes  [facility]  [phone] Patients need to be referred by their primary care doctor.   Medication Assistance: Organization         Address  Phone   Notes  [facility] [address] [place], [place] [address] [phone] --Must be a resident of [facility] -- Must have NO insurance coverage whatsoever (no Medicaid/ Medicare, etc.) -- The pt. MUST have a primary care doctor that directs their care regularly and follows them in the community   [facility]  [phone]   [employer]  [phone]    Agencies that provide inexpensive medical care: Organization         Address  Phone   Notes  [place]  [phone]   [facility]    [phone]   [facility] [place], [place] [address] [phone]   [place] [address] [place] [phone]   [facility]    [phone]   [facility]    [phone]   [place] [address], [place] Phone:  [phone], Fax:  [phone] Hours of Operation:  9 am - 6 pm, M-F.  Also accepts Medicaid/Medicare and self-pay.  [facility] for [address], [address], [place] Phone: [phone], Fax: [phone]. Hours of Operation:  8:30 am - 5:30 pm, M-F.  Also accepts Medicaid and self-pay.  [facility] [address], [place] Phone: [phone]   [place], [place], [place] [phone], Ext. [phone] Mondays & Thursdays: 7-9 AM.  First 15 patients are seen on a first come, first serve basis.    [place] Providers:  Organization         Address  Phone   Notes  [facility] [address],  [phone] Also accepts self-pay patients.  [facility] [address], [place]  [phone]   [place], [address], [place] [phone]   [facility] [address], [place] [phone]   Lucianne Lei [address], [place]   [phone] Only accepts [place] [facility] [place] patients after they have their name applied to their card.   Self-Pay (no insurance) in [facility]:  Organization         Address  Phone   Notes  Sickle Cell Patients, [facility] [place], [place] [phone]  [facility] [place] [phone]   [facility] [place]  [place], [address], [place] [phone]   [facility]/Dr. Osei-Bonsu  [address], [place] or [address], [place] [phone] Phone number for both [place] and [place] locations is the same.  [facility] and [facility] [address], [place] [phone]   [facility] [address], [place] or [address] [phone]   [facility] [address] [place] [phone], phone; [phone], fax Sees patients 1st and 3rd Saturday of every month.  Must not qualify for public or private insurance (i.e. Medicaid, Medicare, [place] Health Choice, Veterans' Benefits)  Household income should be no more than 200% of the poverty level The clinic cannot treat you if you are pregnant or think you are pregnant  Sexually transmitted diseases are not treated at the clinic.

## 2019-04-12 NOTE — ED Notes (Signed)
Pt left before RN could give meds and edp to do discharge papers. Did not notify staff.

## 2019-04-13 LAB — RPR: RPR Ser Ql: NONREACTIVE

## 2019-04-13 LAB — HIV ANTIBODY (ROUTINE TESTING W REFLEX): HIV Screen 4th Generation wRfx: NONREACTIVE

## 2019-04-14 LAB — GC/CHLAMYDIA PROBE AMP (~~LOC~~) NOT AT ARMC
Chlamydia: NEGATIVE
Neisseria Gonorrhea: NEGATIVE

## 2019-06-20 ENCOUNTER — Emergency Department (HOSPITAL_COMMUNITY)
Admission: EM | Admit: 2019-06-20 | Discharge: 2019-06-20 | Disposition: A | Discharge status: Home / Self Care | Payer: Self-pay | Attending: Emergency Medicine | Admitting: Emergency Medicine

## 2019-06-20 ENCOUNTER — Other Ambulatory Visit: Payer: Self-pay

## 2019-06-20 ENCOUNTER — Emergency Department (HOSPITAL_COMMUNITY): Payer: Self-pay

## 2019-06-20 ENCOUNTER — Encounter (HOSPITAL_COMMUNITY): Payer: Self-pay

## 2019-06-20 DIAGNOSIS — I10 Essential (primary) hypertension: Secondary | ICD-10-CM | POA: Insufficient documentation

## 2019-06-20 DIAGNOSIS — W06XXXA Fall from bed, initial encounter: Secondary | ICD-10-CM | POA: Insufficient documentation

## 2019-06-20 DIAGNOSIS — Z7984 Long term (current) use of oral hypoglycemic drugs: Secondary | ICD-10-CM | POA: Insufficient documentation

## 2019-06-20 DIAGNOSIS — Y939 Activity, unspecified: Secondary | ICD-10-CM | POA: Insufficient documentation

## 2019-06-20 DIAGNOSIS — S99911A Unspecified injury of right ankle, initial encounter: Secondary | ICD-10-CM | POA: Insufficient documentation

## 2019-06-20 DIAGNOSIS — S99919A Unspecified injury of unspecified ankle, initial encounter: Secondary | ICD-10-CM

## 2019-06-20 DIAGNOSIS — E119 Type 2 diabetes mellitus without complications: Secondary | ICD-10-CM | POA: Insufficient documentation

## 2019-06-20 DIAGNOSIS — S99912A Unspecified injury of left ankle, initial encounter: Secondary | ICD-10-CM | POA: Insufficient documentation

## 2019-06-20 DIAGNOSIS — Y929 Unspecified place or not applicable: Secondary | ICD-10-CM | POA: Insufficient documentation

## 2019-06-20 DIAGNOSIS — F17210 Nicotine dependence, cigarettes, uncomplicated: Secondary | ICD-10-CM | POA: Insufficient documentation

## 2019-06-20 DIAGNOSIS — X500XXA Overexertion from strenuous movement or load, initial encounter: Secondary | ICD-10-CM | POA: Insufficient documentation

## 2019-06-20 DIAGNOSIS — Y999 Unspecified external cause status: Secondary | ICD-10-CM | POA: Insufficient documentation

## 2019-06-20 NOTE — ED Notes (Signed)
Pt stated that she can bear weight on both of her ankles, however, it hurts & she cant walk very fast.

## 2019-06-20 NOTE — Discharge Instructions (Signed)
You were seen in the ED today for bilateral ankle pain after twisting and falling off of a bed Your xrays were negative for any breaks today.  Please take 600 mg Ibuprofen every 6-8 hours as needed for your pain Please rest, ice, and elevate your ankles to help reduce the swelling Follow up with PCP. If you do not have one you can follow up with [facility] for your primary care needs

## 2019-06-20 NOTE — ED Provider Notes (Signed)
[facility] EMERGENCY DEPARTMENT Provider Note   CSN: [id number] Arrival date & time: 06/20/19  1454    History   Chief Complaint No chief complaint on file.   HPI Mallory Mcdowell is a 33 y.o. female with PMHx HTN and diabetes who presents to the ED today complaining of sudden onset, constant, achy, bilateral ankle pain that began last night around 2 AM. Pt reports she was "running across a bed" when she fell and twisted both of her ankles. No head injury or LOC. Pt reports she heard a pop to both of her ankles and they are swollen. She has not taken anything for the pain. No other complaints at this time.        Past Medical History:  Diagnosis Date  . Diabetes mellitus without complication ([place])   . Hypertension     Patient Active Problem List   Diagnosis Date Noted  . Encounter for physical examination, contraception, and Papanicolaou smear 12/24/2016  . Family planning 12/24/2016  . Cigarette nicotine dependence without complication 02/58/5277  . Uncontrolled diabetes mellitus type 2 without complications ([place]) 82/42/3536  . Elevated platelet count 05/01/2016  . Alcohol abuse 05/01/2016    History reviewed. No pertinent surgical history.   OB History    Gravida  0   Para  0   Term  0   Preterm  0   AB  0   Living  0     SAB  0   TAB  0   Ectopic  0   Multiple  0   Live Births               Home Medications    Prior to Admission medications   Medication Sig Start Date End Date Taking? Authorizing Provider  cyclobenzaprine (FLEXERIL) 10 MG tablet Take 1 tablet (10 mg total) by mouth 2 (two) times daily as needed for muscle spasms. 12/03/18   Pattricia Boss, MD  hydrochlorothiazide (HYDRODIURIL) 25 MG tablet Take 1 tablet (25 mg total) by mouth daily. 04/12/19   Darlin Drop P, PA-C  ibuprofen (ADVIL,MOTRIN) 600 MG tablet Take 1 tablet (600 mg total) by mouth every 6 (six) hours as needed. 11/29/18   Isla Pence, MD   metFORMIN (GLUCOPHAGE) 500 MG tablet Take 1 tablet (500 mg total) by mouth 2 (two) times daily with a meal. Patient not taking: Reported on 04/12/2019 02/15/18   Triplett, Tammy, PA-C  metroNIDAZOLE (FLAGYL) 500 MG tablet Take 1 tablet (500 mg total) by mouth 2 (two) times daily. 04/12/19   Arville Lime, PA-C    Family History Family History  Problem Relation Age of Onset  . Asthma Mother   . Diabetes Other   . Heart failure Other   . Cancer Other     Social History Social History   Tobacco Use  . Smoking status: Current Every Day Smoker    Packs/day: 0.50    Types: Cigarettes  . Smokeless tobacco: Never Used  Substance Use Topics  . Alcohol use: Yes    Alcohol/week: 1.0 standard drinks    Types: 1 Cans of beer per week    Comment: sometimes daily, but not always  . Drug use: Yes    Types: Marijuana    Comment: marijuana last used 02/14/18     Allergies   Patient has no known allergies.   Review of Systems Review of Systems  Constitutional: Negative for chills and fever.  Musculoskeletal: Positive for arthralgias and joint  swelling.  Skin: Negative for color change.  Neurological: Negative for weakness and numbness.     Physical Exam Updated Vital Signs BP (!) 155/99 (BP Location: Right Arm)   Pulse (!) 108   Temp 98.4 F (36.9 C) (Oral)   Resp 18   Ht 5\' 5"  (1.651 m)   Wt 81.6 kg   SpO2 99%   BMI 29.95 kg/m   Physical Exam Vitals signs and nursing note reviewed.  Constitutional:      Appearance: She is not ill-appearing.  HENT:     Head: Normocephalic and atraumatic.  Eyes:     Conjunctiva/sclera: Conjunctivae normal.  Cardiovascular:     Rate and Rhythm: Normal rate and regular rhythm.  Pulmonary:     Effort: Pulmonary effort is normal.     Breath sounds: Normal breath sounds.  Musculoskeletal:     Comments: Mild swelling noted to bilateral ankles with diffuse tenderness to palpation. No tenderness to 5th metatarsal bilaterally. ROM intact  throughout ankle. Able to wiggle toes. Strength 5/5. Sensation intact throughout. 2+ distal pulses.   Skin:    General: Skin is warm and dry.     Coloration: Skin is not jaundiced.  Neurological:     Mental Status: She is alert.      ED Treatments / Results  Labs (all labs ordered are listed, but only abnormal results are displayed) Labs Reviewed - No data to display  EKG None  Radiology Dg Ankle Complete Left  Result Date: 06/20/2019 CLINICAL DATA:  Twisting injury with ankle pain, initial encounter EXAM: LEFT ANKLE COMPLETE - 3+ VIEW COMPARISON:  None. FINDINGS: Very mild soft tissue swelling is noted about the ankle joint. No acute fracture or dislocation is seen. IMPRESSION: Mild soft tissue swelling without acute bony abnormality. Electronically Signed   By: Inez Catalina M.D.   On: 06/20/2019 16:50   Dg Ankle Complete Right  Result Date: 06/20/2019 CLINICAL DATA:  Twisting injury with ankle pain, initial encounter EXAM: RIGHT ANKLE - COMPLETE 3+ VIEW COMPARISON:  None. FINDINGS: There is no evidence of fracture, dislocation, or joint effusion. There is no evidence of arthropathy or other focal bone abnormality. Soft tissues are unremarkable. IMPRESSION: No acute abnormality noted. Electronically Signed   By: Inez Catalina M.D.   On: 06/20/2019 16:51    Procedures Procedures (including critical care time)  Medications Ordered in ED Medications - No data to display   Initial Impression / Assessment and Plan / ED Course  I have reviewed the triage vital signs and the nursing notes.  Pertinent labs & imaging results that were available during my care of the patient were reviewed by me and considered in my medical decision making (see chart for details).    33 year old female presenting to the ED with bilateral ankle pain and swelling after falling off a bed last night. Pt able to ambulate but and can bear weight but reports pain with it. Has not taken anything for the pain.  On exam ankles have mild swelling with TTP diffusely. No tenderness to 5th metatarsals to suggest jones fractures. Xrays obtained; both were negative for acute fractures. Will discharge patient home at this time. Advised to take Ibuprofen PRN for pain and to rest, ice, and elevate. Pt requesting work note tonight; will oblige. Feel she can go back to work tomorrow. Pt is in agreement with plan at this time and stable for discharge home.        Final Clinical Impressions(s) /  ED Diagnoses   Final diagnoses:  Ankle injury, initial encounter    ED Discharge Orders    None       Eustaquio Maize, Hershal Coria 06/20/19 2122    Charlesetta Shanks, MD 06/26/19 1153

## 2019-06-20 NOTE — ED Triage Notes (Signed)
Pt was walking across a bed & ended up falling off of it, she ended up hurting both of her ankles saying she heard a pop on both sides. Bilateral ankle are swollen upon arrival.

## 2019-12-20 ENCOUNTER — Other Ambulatory Visit: Payer: Self-pay

## 2019-12-20 ENCOUNTER — Emergency Department (HOSPITAL_COMMUNITY)
Admission: EM | Admit: 2019-12-20 | Discharge: 2019-12-21 | Disposition: A | Discharge status: Home / Self Care | Payer: Medicaid Other | Attending: Emergency Medicine | Admitting: Emergency Medicine

## 2019-12-20 ENCOUNTER — Encounter (HOSPITAL_COMMUNITY): Payer: Self-pay | Admitting: *Deleted

## 2019-12-20 DIAGNOSIS — Z7984 Long term (current) use of oral hypoglycemic drugs: Secondary | ICD-10-CM | POA: Insufficient documentation

## 2019-12-20 DIAGNOSIS — E119 Type 2 diabetes mellitus without complications: Secondary | ICD-10-CM | POA: Insufficient documentation

## 2019-12-20 DIAGNOSIS — Z87891 Personal history of nicotine dependence: Secondary | ICD-10-CM | POA: Insufficient documentation

## 2019-12-20 DIAGNOSIS — N760 Acute vaginitis: Secondary | ICD-10-CM | POA: Insufficient documentation

## 2019-12-20 DIAGNOSIS — I10 Essential (primary) hypertension: Secondary | ICD-10-CM | POA: Insufficient documentation

## 2019-12-20 LAB — CBG MONITORING, ED: Glucose-Capillary: 204 mg/dL — ABNORMAL HIGH (ref 70–99)

## 2019-12-20 LAB — WET PREP, GENITAL
Clue Cells Wet Prep HPF POC: NONE SEEN
Sperm: NONE SEEN
Trich, Wet Prep: NONE SEEN
Yeast Wet Prep HPF POC: NONE SEEN

## 2019-12-20 NOTE — ED Triage Notes (Signed)
Pt c/o itching to her vagina

## 2019-12-21 MED ORDER — FLUCONAZOLE 150 MG PO TABS: ORAL_TABLET | ORAL | 0 refills | Status: DC

## 2019-12-21 NOTE — ED Provider Notes (Signed)
[facility] EMERGENCY DEPARTMENT Provider Note   CSN: [id number] Arrival date & time: 12/20/19  2145     History Chief Complaint  Patient presents with  . Vaginitis    Mallory Mcdowell is a 34 y.o. female.  The history is provided by the patient. No language interpreter was used.  Vaginal Itching This is a new problem. The problem occurs constantly. The problem has been gradually worsening. Pertinent negatives include no chest pain and no abdominal pain. Nothing aggravates the symptoms. She has tried nothing for the symptoms. The treatment provided no relief.  Pt reports her glucose has been elevated.  Pt is taking metformin but glucose has been in 200 range.  Pt reports she has vaginal swelling and itching      Past Medical History:  Diagnosis Date  . Diabetes mellitus without complication ([place])   . Hypertension     Patient Active Problem List   Diagnosis Date Noted  . Encounter for physical examination, contraception, and Papanicolaou smear 12/24/2016  . Family planning 12/24/2016  . Cigarette nicotine dependence without complication [id number]  . Uncontrolled diabetes mellitus type 2 without complications [id number]  . Elevated platelet count 05/01/2016  . Alcohol abuse 05/01/2016    History reviewed. No pertinent surgical history.   OB History    Gravida  0   Para  0   Term  0   Preterm  0   AB  0   Living  0     SAB  0   TAB  0   Ectopic  0   Multiple  0   Live Births              Family History  Problem Relation Age of Onset  . Asthma Mother   . Diabetes Other   . Heart failure Other   . Cancer Other     Social History   Tobacco Use  . Smoking status: Former Smoker    Packs/day: 0.50    Types: Cigarettes  . Smokeless tobacco: Never Used  Substance Use Topics  . Alcohol use: Yes    Alcohol/week: 1.0 standard drinks    Types: 1 Cans of beer per week    Comment: sometimes daily, but not always  . Drug use: Yes    Types:  Marijuana    Comment: marijuana last used 02/14/18    Home Medications Prior to Admission medications   Medication Sig Start Date End Date Taking? Authorizing Provider  lisinopril-hydrochlorothiazide (ZESTORETIC) 20-12.5 MG tablet Take 1 tablet by mouth daily. 12/11/19  Yes [provider]  metFORMIN (GLUCOPHAGE) 500 MG tablet Take 1 tablet (500 mg total) by mouth 2 (two) times daily with a meal. 02/15/18  Yes Triplett, Tammy, PA-C    Allergies    Amoxicillin  Review of Systems   Review of Systems  Cardiovascular: Negative for chest pain.  Gastrointestinal: Negative for abdominal pain.  All other systems reviewed and are negative.   Physical Exam Updated Vital Signs BP 115/71 (BP Location: Right Arm)   Pulse (!) 125   Temp 98.2 F (36.8 C) (Oral)   Resp 18   Ht 5\' 5"  (1.651 m)   Wt 81.6 kg   LMP 12/01/2019   SpO2 100%   BMI 29.95 kg/m   Physical Exam Vitals and nursing note reviewed.  Constitutional:      Appearance: She is well-developed.  HENT:     Head: Normocephalic.  Cardiovascular:     Rate and Rhythm: Normal  rate.  Pulmonary:     Effort: Pulmonary effort is normal.  Abdominal:     General: There is no distension.  Genitourinary:    Vagina: Vaginal discharge present.     Comments: Thick whit vaginal discharge, swollen tender labia, no lesions  Musculoskeletal:        General: Normal range of motion.     Cervical back: Normal range of motion.  Skin:    General: Skin is warm.  Neurological:     Mental Status: She is alert and oriented to person, place, and time.  Psychiatric:        Mood and Affect: Mood normal.     ED Results / Procedures / Treatments   Labs (all labs ordered are listed, but only abnormal results are displayed) Labs Reviewed  WET PREP, GENITAL - Abnormal; Notable for the following components:      Result Value   WBC, Wet Prep HPF POC RARE (*)    All other components within normal limits  CBG MONITORING, ED - Abnormal;  Notable for the following components:   Glucose-Capillary 204 (*)    All other components within normal limits  GC/CHLAMYDIA PROBE AMP ([place]) NOT AT [facility]    EKG None  Radiology No results found.  Procedures Procedures (including critical care time)  Medications Ordered in ED Medications - No data to display  ED Course  I have reviewed the triage vital signs and the nursing notes.  Pertinent labs & imaging results that were available during my care of the patient were reviewed by me and considered in my medical decision making (see chart for details).    MDM Rules/Calculators/A&P                      MDM: I suspect pt has a yeast infection.  Pt advised to follow up with her primary Md .   Final Clinical Impression(s) / ED Diagnoses Final diagnoses:  Acute vaginitis    Rx / DC Orders ED Discharge Orders         Ordered    fluconazole (DIFLUCAN) 150 MG tablet     12/21/19 0013        An After Visit Summary was printed and given to the patient.    Fransico Meadow, Hershal Coria [id number]    Delora Fuel, MD [id number] [phone]

## 2019-12-22 LAB — GC/CHLAMYDIA PROBE AMP (~~LOC~~) NOT AT ARMC
Chlamydia: NEGATIVE
Neisseria Gonorrhea: NEGATIVE

## 2020-01-18 ENCOUNTER — Other Ambulatory Visit: Payer: Self-pay

## 2020-01-18 ENCOUNTER — Ambulatory Visit
Admission: EM | Admit: 2020-01-18 | Discharge: 2020-01-18 | Disposition: A | Discharge status: Home / Self Care | Payer: Medicaid Other | Attending: Emergency Medicine | Admitting: Emergency Medicine

## 2020-01-18 DIAGNOSIS — N898 Other specified noninflammatory disorders of vagina: Secondary | ICD-10-CM | POA: Diagnosis not present

## 2020-01-18 MED ORDER — FLUCONAZOLE 200 MG PO TABS: ORAL_TABLET | ORAL | 0 refills | Status: DC

## 2020-01-18 NOTE — Discharge Instructions (Signed)
Vaginal self-swab obtained.  We will follow up with you regarding abnormal results Prescribed diflucan 200 mg once daily and then second dose 72 hours later Take medications as prescribed and to completion If tests results are positive, please abstain from sexual activity until you and your partner(s) have been treated Follow up with PCP if symptoms persists Return here or go to ER if you have any new or worsening symptoms fever, chills, nausea, vomiting, abdominal or pelvic pain, painful intercourse, vaginal discharge, vaginal bleeding, persistent symptoms despite treatment, etc..[name]

## 2020-01-18 NOTE — ED Triage Notes (Signed)
Pt reports vaginal itching x 2 days.  Denies any abnormal  vaginal discharge or bleeding.

## 2020-01-18 NOTE — ED Provider Notes (Signed)
[facility]   [id number] 01/18/20 Arrival Time: [id number]   [id number] itching  SUBJECTIVE:  Mallory Mcdowell is a 34 y.o. female who presents with complaints of vaginal itching x 2 days.  Does admit to using a new body wash recently.  Last sex 4-5 days ago.  Patient has been sexually active with 1 female partner over the past 6 months.  Denies concern for STDs.  Has not tried OTC medications.  Denies aggravating factors.  Reports similar symptoms in the past and treated for yeast infection.  She denies fever, chills, nausea, vomiting, abdominal or pelvic pain, urinary symptoms, vaginal odor, vaginal bleeding, dyspareunia, vaginal rashes or lesions.   Patient's last menstrual period was 01/07/2020 (approximate). Current birth control method: None  ROS: As per HPI.  All other pertinent ROS negative.     Past Medical History:  Diagnosis Date  . Diabetes mellitus without complication ([place])   . Hypertension    History reviewed. No pertinent surgical history. Allergies  Allergen Reactions  . Amoxicillin Swelling   No current facility-administered medications on file prior to encounter.   Current Outpatient Medications on File Prior to Encounter  Medication Sig Dispense Refill  . lisinopril-hydrochlorothiazide (ZESTORETIC) 20-12.5 MG tablet Take 1 tablet by mouth daily.    . metFORMIN (GLUCOPHAGE) 500 MG tablet Take 1 tablet (500 mg total) by mouth 2 (two) times daily with a meal. 60 tablet 0    Social History   Socioeconomic History  . Marital status: Single    Spouse name: Not on file  . Number of children: Not on file  . Years of education: Not on file  . Highest education level: Not on file  Occupational History  . Not on file  Tobacco Use  . Smoking status: Former Smoker    Packs/day: 0.50    Types: Cigarettes  . Smokeless tobacco: Never Used  Substance and Sexual Activity  . Alcohol use: Yes    Alcohol/week: 1.0 standard drinks    Types: 1 Cans of beer per  week    Comment: sometimes daily, but not always  . Drug use: Yes    Types: Marijuana  . Sexual activity: Yes    Birth control/protection: None  Other Topics Concern  . Not on file  Social History Narrative  . Not on file   Social Determinants of Health   Financial Resource Strain:   . Difficulty of Paying Living Expenses:   Food Insecurity:   . Worried About Charity fundraiser in the Last Year:   . Arboriculturist in the Last Year:   Transportation Needs:   . Film/video editor (Medical):   [name] Lack of Transportation (Non-Medical):   Physical Activity:   . Days of Exercise per Week:   . Minutes of Exercise per Session:   Stress:   . Feeling of Stress :   Social Connections:   . Frequency of Communication with Friends and Family:   . Frequency of Social Gatherings with Friends and Family:   . Attends Religious Services:   . Active Member of Clubs or Organizations:   . Attends Archivist Meetings:   [name] Marital Status:   Intimate Partner Violence:   . Fear of Current or Ex-Partner:   . Emotionally Abused:   [name] Physically Abused:   . Sexually Abused:    Family History  Problem Relation Age of Onset  . Asthma Mother   . Diabetes Other   . Heart failure  Other   . Cancer Other     OBJECTIVE:  Vitals:   01/18/20 0904 01/18/20 0907  BP: 136/90   Pulse: (!) 104   Resp: 16   Temp: 98.1 F (36.7 C)   TempSrc: Oral   SpO2: 96%   Weight:  190 lb (86.2 kg)  Height:  5\' 5"  (1.651 m)     General appearance: Alert, NAD, appears stated age Head: NCAT Eyes: PERRL, EOMI grossly Throat: lips, mucosa, and tongue normal; teeth and gums normal Lungs: CTA bilaterally without adventitious breath sounds Heart: regular rate and rhythm.   Back: no CVA tenderness Abdomen: soft, non-tender; bowel sounds normal; no guarding GU: deferred Skin: warm and dry Psychological:  Alert and cooperative. Normal mood and affect.  ASSESSMENT & PLAN:  1. Vaginal itching      Meds ordered this encounter  Medications  . fluconazole (DIFLUCAN) 200 MG tablet    Sig: Take one dose by mouth, wait 72 hours, and then take second dose by mouth    Dispense:  2 tablet    Refill:  0    Order Specific Question:   Supervising Provider    Answer:   Raylene Everts [id number]   Vaginal self-swab obtained.  We will follow up with you regarding abnormal results Prescribed diflucan 200 mg once daily and then second dose 72 hours later Take medications as prescribed and to completion If tests results are positive, please abstain from sexual activity until you and your partner(s) have been treated Follow up with PCP if symptoms persists Return here or go to ER if you have any new or worsening symptoms fever, chills, nausea, vomiting, abdominal or pelvic pain, painful intercourse, vaginal discharge, vaginal bleeding, persistent symptoms despite treatment, etc...  Reviewed expectations re: course of current medical issues. Questions answered. Outlined signs and symptoms indicating need for more acute intervention. Patient verbalized understanding. After Visit Summary given.       Lestine Box, PA-C 01/18/20 [phone]

## 2020-01-20 LAB — CERVICOVAGINAL ANCILLARY ONLY
Bacterial vaginitis: NEGATIVE
Candida vaginitis: POSITIVE — AB
Chlamydia: NEGATIVE
Neisseria Gonorrhea: NEGATIVE
Trichomonas: NEGATIVE

## 2020-01-22 ENCOUNTER — Telehealth (HOSPITAL_COMMUNITY): Payer: Self-pay | Admitting: *Deleted

## 2020-01-22 NOTE — Telephone Encounter (Signed)
Results reviewed and discussed with patient. Patient positive for yeast, prescription given at time of visit for diflucan. Patient states she is feeling better, all questions answered.

## 2020-02-27 ENCOUNTER — Ambulatory Visit
Admission: EM | Admit: 2020-02-27 | Discharge: 2020-02-27 | Disposition: A | Discharge status: Home / Self Care | Payer: Medicaid Other | Attending: Emergency Medicine | Admitting: Emergency Medicine

## 2020-02-27 ENCOUNTER — Encounter: Payer: Self-pay | Admitting: Emergency Medicine

## 2020-02-27 ENCOUNTER — Other Ambulatory Visit: Payer: Self-pay

## 2020-02-27 DIAGNOSIS — N898 Other specified noninflammatory disorders of vagina: Secondary | ICD-10-CM | POA: Insufficient documentation

## 2020-02-27 DIAGNOSIS — Z113 Encounter for screening for infections with a predominantly sexual mode of transmission: Secondary | ICD-10-CM | POA: Insufficient documentation

## 2020-02-27 DIAGNOSIS — R300 Dysuria: Secondary | ICD-10-CM | POA: Insufficient documentation

## 2020-02-27 LAB — POCT URINALYSIS DIP (MANUAL ENTRY)
Bilirubin, UA: NEGATIVE
Blood, UA: NEGATIVE
Glucose, UA: 500 mg/dL — AB
Leukocytes, UA: NEGATIVE
Nitrite, UA: NEGATIVE
Protein Ur, POC: NEGATIVE mg/dL
Spec Grav, UA: 1.025 (ref 1.010–1.025)
Urobilinogen, UA: 0.2 E.U./dL
pH, UA: 5.5 (ref 5.0–8.0)

## 2020-02-27 LAB — POCT URINE PREGNANCY: Preg Test, Ur: NEGATIVE

## 2020-02-27 MED ORDER — FLUCONAZOLE 150 MG PO TABS: 150.0000 mg | ORAL_TABLET | Freq: Every day | ORAL | 0 refills | Status: DC

## 2020-02-27 NOTE — ED Provider Notes (Signed)
[facility]   [id number] 02/27/20 Arrival Time: 1405   [id number] DISCHARGE  SUBJECTIVE:  Mallory Mcdowell is a 34 y.o. female who presents to the urgent care with a complaint of vaginal discharge, dysuria and irritation for the past 3 days.  She denies a precipitating event, recent sexual encounter or recent antibiotic use.  Patient is sexually active with 1 female partner.  Describes discharge as  thick white and yellow.  Has not tried any OTC medication.  She reports worsening symptoms as time goes by.  She reports similar symptoms in the past and was diagnosed with yeast infection and treated with Diflucan.  She denies fever, chills, nausea, vomiting, abdominal or pelvic pain, urinary symptoms, , vaginal odor, vaginal bleeding, dyspareunia, vaginal rashes or lesions.   No LMP recorded. Current birth control method: Compliant with BC:  ROS: As per HPI.  All other pertinent ROS negative.     Past Medical History:  Diagnosis Date  . Diabetes mellitus without complication ([place])   . Hypertension    History reviewed. No pertinent surgical history. Allergies  Allergen Reactions  . Amoxicillin Swelling   No current facility-administered medications on file prior to encounter.   Current Outpatient Medications on File Prior to Encounter  Medication Sig Dispense Refill  . lisinopril-hydrochlorothiazide (ZESTORETIC) 20-12.5 MG tablet Take 1 tablet by mouth daily.    . metFORMIN (GLUCOPHAGE) 500 MG tablet Take 1 tablet (500 mg total) by mouth 2 (two) times daily with a meal. 60 tablet 0    Social History   Socioeconomic History  . Marital status: Single    Spouse name: Not on file  . Number of children: Not on file  . Years of education: Not on file  . Highest education level: Not on file  Occupational History  . Not on file  Tobacco Use  . Smoking status: Former Smoker    Packs/day: 0.50    Types: Cigarettes  . Smokeless tobacco: Never Used  Substance and Sexual  Activity  . Alcohol use: Yes    Alcohol/week: 1.0 standard drinks    Types: 1 Cans of beer per week    Comment: sometimes daily, but not always  . Drug use: Yes    Types: Marijuana  . Sexual activity: Yes    Birth control/protection: None  Other Topics Concern  . Not on file  Social History Narrative  . Not on file   Social Determinants of Health   Financial Resource Strain:   . Difficulty of Paying Living Expenses:   Food Insecurity:   . Worried About Charity fundraiser in the Last Year:   . Arboriculturist in the Last Year:   Transportation Needs:   . Film/video editor (Medical):   [name] Lack of Transportation (Non-Medical):   Physical Activity:   . Days of Exercise per Week:   . Minutes of Exercise per Session:   Stress:   . Feeling of Stress :   Social Connections:   . Frequency of Communication with Friends and Family:   . Frequency of Social Gatherings with Friends and Family:   . Attends Religious Services:   . Active Member of Clubs or Organizations:   . Attends Archivist Meetings:   [name] Marital Status:   Intimate Partner Violence:   . Fear of Current or Ex-Partner:   . Emotionally Abused:   [name] Physically Abused:   . Sexually Abused:    Family History  Problem Relation Age  of Onset  . Asthma Mother   . Diabetes Other   . Heart failure Other   . Cancer Other     OBJECTIVE:  Vitals:   02/27/20 1417  BP: (!) 141/90  Pulse: (!) 105  Resp: 18  Temp: 98.6 F (37 C)  TempSrc: Oral  SpO2: 96%     General appearance: Alert, NAD, appears stated age Head: NCAT Throat: lips, mucosa, and tongue normal; teeth and gums normal Lungs: CTA bilaterally without adventitious breath sounds Heart: Tachycardia  and rhythm rhythm.  Radial pulses 2+ symmetrical bilaterally Back: no CVA tenderness Abdomen: soft, non-tender; bowel sounds normal; no masses or organomegaly; no guarding or rebound tenderness GU: Vaginal self swab obtained Skin: warm and  dry Psychological:  Alert and cooperative. Normal mood and affect.  LABS:  Results for orders placed or performed during the hospital encounter of 02/27/20  POCT urinalysis dipstick  Result Value Ref Range   Color, UA yellow yellow   Clarity, UA clear clear   Glucose, UA =500 (A) negative mg/dL   Bilirubin, UA negative negative   Ketones, POC UA trace (5) (A) negative mg/dL   Spec Grav, UA 1.025 1.010 - 1.025   Blood, UA negative negative   pH, UA 5.5 5.0 - 8.0   Protein Ur, POC negative negative mg/dL   Urobilinogen, UA 0.2 0.2 or 1.0 E.U./dL   Nitrite, UA Negative Negative   Leukocytes, UA Negative Negative  POCT urine pregnancy  Result Value Ref Range   Preg Test, Ur Negative Negative    Labs Reviewed  POCT URINALYSIS DIP (MANUAL ENTRY) - Abnormal; Notable for the following components:      Result Value   Glucose, UA =500 (*)    Ketones, POC UA trace (5) (*)    All other components within normal limits  POCT URINE PREGNANCY  CERVICOVAGINAL ANCILLARY ONLY    ASSESSMENT & PLAN:  1. Dysuria   2. Vaginal discharge   3. Screening for STD (sexually transmitted disease)     Meds ordered this encounter  Medications  . fluconazole (DIFLUCAN) 150 MG tablet    Sig: Take 1 tablet (150 mg total) by mouth daily. Take second dose 72 hours after the first dose if symptom does not resolve    Dispense:  2 tablet    Refill:  0  Patient is stable at discharge.  We will treat her for possible yeast infection as she cannot wait for result.  Pending: Labs Reviewed  POCT URINALYSIS DIP (MANUAL ENTRY) - Abnormal; Notable for the following components:      Result Value   Glucose, UA =500 (*)    Ketones, POC UA trace (5) (*)    All other components within normal limits  POCT URINE PREGNANCY  CERVICOVAGINAL ANCILLARY ONLY    Discharge instruction Vaginal self-swab obtained.  We will follow up with you regarding abnormal results) Prescribed diflucan 150 mg once daily and then  second dose 72 hours later Take medications as prescribed and to completion If tests results are positive, please abstain from sexual activity until you and your partner(s) have been treated Follow up with PCP or [place] if symptoms persists Return here or go to ER if you have any new or worsening symptoms fever, chills, nausea, vomiting, abdominal or pelvic pain, painful intercourse, vaginal discharge, vaginal bleeding, persistent symptoms despite treatment, etc...  Reviewed expectations re: course of current medical issues. Questions answered. Outlined signs and symptoms indicating need for more acute intervention. Patient  verbalized understanding. After Visit Summary given.       Emerson Monte, [place] 02/27/20 1442

## 2020-02-27 NOTE — ED Triage Notes (Signed)
Pt here for vaginal discharge and irritation x 3 days; pt also sts some dysuria; pt sts thinks it is a yeast infection

## 2020-02-27 NOTE — Discharge Instructions (Addendum)
Vaginal self-swab obtained.  We will follow up with you regarding abnormal results Prescribed diflucan 150 mg once daily and then second dose 72 hours later Take medications as prescribed and to completion If tests results are positive, please abstain from sexual activity until you and your partner(s) have been treated Follow up with PCP or [facility] if symptoms persists Return here or go to ER if you have any new or worsening symptoms fever, chills, nausea, vomiting, abdominal or pelvic pain, painful intercourse, vaginal discharge, vaginal bleeding, persistent symptoms despite treatment, etc... 

## 2020-02-29 LAB — CERVICOVAGINAL ANCILLARY ONLY
Bacterial Vaginitis (gardnerella): NEGATIVE
Candida Glabrata: NEGATIVE
Candida Vaginitis: POSITIVE — AB
Chlamydia: NEGATIVE
Comment: NEGATIVE
Comment: NEGATIVE
Comment: NEGATIVE
Comment: NEGATIVE
Comment: NEGATIVE
Comment: NORMAL
Neisseria Gonorrhea: NEGATIVE
Trichomonas: NEGATIVE

## 2020-04-13 ENCOUNTER — Ambulatory Visit
Admission: EM | Admit: 2020-04-13 | Discharge: 2020-04-13 | Disposition: A | Discharge status: Home / Self Care | Payer: Medicaid Other | Attending: Emergency Medicine | Admitting: Emergency Medicine

## 2020-04-13 DIAGNOSIS — Z760 Encounter for issue of repeat prescription: Secondary | ICD-10-CM | POA: Insufficient documentation

## 2020-04-13 DIAGNOSIS — Z113 Encounter for screening for infections with a predominantly sexual mode of transmission: Secondary | ICD-10-CM | POA: Insufficient documentation

## 2020-04-13 LAB — POCT URINALYSIS DIP (MANUAL ENTRY)
Bilirubin, UA: NEGATIVE
Blood, UA: NEGATIVE
Glucose, UA: >=1000 mg/dL — AB
Ketones, POC UA: NEGATIVE mg/dL
Leukocytes, UA: NEGATIVE
Nitrite, UA: NEGATIVE
Protein Ur, POC: NEGATIVE mg/dL
Spec Grav, UA: 1.025 (ref 1.010–1.025)
Urobilinogen, UA: 0.2 E.U./dL
pH, UA: 5 (ref 5.0–8.0)

## 2020-04-13 LAB — POCT FASTING CBG KUC MANUAL ENTRY: POCT Glucose (KUC): 289 mg/dL — AB (ref 70–99)

## 2020-04-13 LAB — POCT URINE PREGNANCY: Preg Test, Ur: NEGATIVE

## 2020-04-13 MED ORDER — LISINOPRIL-HYDROCHLOROTHIAZIDE 20-12.5 MG PO TABS: 1.0000 | ORAL_TABLET | Freq: Every day | ORAL | 0 refills | Status: DC

## 2020-04-13 MED ORDER — FLUCONAZOLE 150 MG PO TABS: 150.0000 mg | ORAL_TABLET | Freq: Once | ORAL | 0 refills | Status: AC

## 2020-04-13 MED ORDER — METFORMIN HCL 500 MG PO TABS: 500.0000 mg | ORAL_TABLET | Freq: Two times a day (BID) | ORAL | 0 refills | Status: DC

## 2020-04-13 NOTE — ED Triage Notes (Signed)
Pt presents with vaginal discharge and thinks she has another yeast infection

## 2020-04-13 NOTE — Discharge Instructions (Addendum)
Prescribed diflucan 150 mg once daily and then second dose 72 hours Metformin, lisinopril/HCTZ were refilled Take medications as prescribed and to completion We will follow up with you regarding the results of your test If tests are positive, please abstain from sexual activity until you and your partner(s) are treated Follow up with PCP or [facility] if symptoms persists Return here or go to ER if you have any new or worsening symptoms

## 2020-04-13 NOTE — ED Provider Notes (Signed)
[facility]   [id number] 04/13/20 Arrival Time: 1522   CC: Vaginal irritation  SUBJECTIVE:  Mallory Mcdowell is a 34 y.o. female who presents requesting STI screening.  Currently asymptomatic.  Partner asymptomatic.  Last unprotected sexual encounter  1 week ago.  Sexually active with 1  female partner.  Denies similar symptoms in the past.  Denies fever, chills, nausea, vomiting, abdominal or pelvic pain, urinary symptoms, vaginal itching, vaginal odor, vaginal bleeding, dyspareunia, vaginal rashes or lesions.   Patient also would like her blood lisinopril/HCTZ and Metformin refill.  Patient's last menstrual period was 03/23/2020 (approximate).  ROS: As per HPI.  All other pertinent ROS negative.     Past Medical History:  Diagnosis Date  . Diabetes mellitus without complication ([place])   . Hypertension    History reviewed. No pertinent surgical history. Allergies  Allergen Reactions  . Amoxicillin Swelling   No current facility-administered medications on file prior to encounter.   Current Outpatient Medications on File Prior to Encounter  Medication Sig Dispense Refill  . fluconazole (DIFLUCAN) 150 MG tablet Take 1 tablet (150 mg total) by mouth daily. Take second dose 72 hours after the first dose if symptom does not resolve 2 tablet 0  . lisinopril-hydrochlorothiazide (ZESTORETIC) 20-12.5 MG tablet Take 1 tablet by mouth daily.    . metFORMIN (GLUCOPHAGE) 500 MG tablet Take 1 tablet (500 mg total) by mouth 2 (two) times daily with a meal. 60 tablet 0   Social History   Socioeconomic History  . Marital status: Single    Spouse name: Not on file  . Number of children: Not on file  . Years of education: Not on file  . Highest education level: Not on file  Occupational History  . Not on file  Tobacco Use  . Smoking status: Former Smoker    Packs/day: 0.50    Types: Cigarettes  . Smokeless tobacco: Never Used  Substance and Sexual Activity  . Alcohol use:  Yes    Alcohol/week: 1.0 standard drinks    Types: 1 Cans of beer per week    Comment: sometimes daily, but not always  . Drug use: Yes    Types: Marijuana  . Sexual activity: Yes    Birth control/protection: None  Other Topics Concern  . Not on file  Social History Narrative  . Not on file   Social Determinants of Health   Financial Resource Strain:   . Difficulty of Paying Living Expenses:   Food Insecurity:   . Worried About Charity fundraiser in the Last Year:   . Arboriculturist in the Last Year:   Transportation Needs:   . Film/video editor (Medical):   [name] Lack of Transportation (Non-Medical):   Physical Activity:   . Days of Exercise per Week:   . Minutes of Exercise per Session:   Stress:   . Feeling of Stress :   Social Connections:   . Frequency of Communication with Friends and Family:   . Frequency of Social Gatherings with Friends and Family:   . Attends Religious Services:   . Active Member of Clubs or Organizations:   . Attends Archivist Meetings:   [name] Marital Status:   Intimate Partner Violence:   . Fear of Current or Ex-Partner:   . Emotionally Abused:   [name] Physically Abused:   . Sexually Abused:    Family History  Problem Relation Age of Onset  . Asthma Mother   .  Diabetes Other   . Heart failure Other   . Cancer Other     OBJECTIVE:  Vitals:   04/13/20 1537  BP: (!) 146/94  Pulse: 90  Resp: 16  Temp: 98.6 F (37 C)  TempSrc: Oral  SpO2: 98%     General appearance: alert, NAD, appears stated age Head: NCAT Throat: lips, mucosa, and tongue normal; teeth and gums normal Lungs: CTA bilaterally without adventitious breath sounds Heart: regular rate and rhythm.  Radial pulses 2+ symmetrical bilaterally Back: no CVA tenderness Abdomen: soft, non-tender; bowel sounds normal; no masses or organomegaly; no guarding or rebound tenderness GU: deferred Skin: warm and dry Psychological:  Alert and cooperative. Normal mood and  affect.  LABS:  Results for orders placed or performed during the hospital encounter of 04/13/20  POCT urinalysis dipstick  Result Value Ref Range   Color, UA yellow yellow   Clarity, UA clear clear   Glucose, UA >=1,000 (A) negative mg/dL   Bilirubin, UA negative negative   Ketones, POC UA negative negative mg/dL   Spec Grav, UA 1.025 1.010 - 1.025   Blood, UA negative negative   pH, UA 5.0 5.0 - 8.0   Protein Ur, POC negative negative mg/dL   Urobilinogen, UA 0.2 0.2 or 1.0 E.U./dL   Nitrite, UA Negative Negative   Leukocytes, UA Negative Negative  POCT urine pregnancy  Result Value Ref Range   Preg Test, Ur Negative Negative  POCT CBG (manual entry)  Result Value Ref Range   POCT Glucose ([facility]) 289 (A) 70 - 99 mg/dL    Labs Reviewed  POCT URINALYSIS DIP (MANUAL ENTRY) - Abnormal; Notable for the following components:      Result Value   Glucose, UA >=1,000 (*)    All other components within normal limits  POCT FASTING CBG [facility] MANUAL ENTRY - Abnormal; Notable for the following components:   POCT Glucose ([facility]) 289 (*)    All other components within normal limits  URINE CULTURE  POCT URINE PREGNANCY  CERVICOVAGINAL ANCILLARY ONLY    ASSESSMENT & PLAN:  No diagnosis found.  No orders of the defined types were placed in this encounter.   Pending: Labs Reviewed  POCT URINALYSIS DIP (MANUAL ENTRY) - Abnormal; Notable for the following components:      Result Value   Glucose, UA >=1,000 (*)    All other components within normal limits  POCT FASTING CBG [facility] MANUAL ENTRY - Abnormal; Notable for the following components:   POCT Glucose ([facility]) 289 (*)    All other components within normal limits  URINE CULTURE  POCT URINE PREGNANCY  CERVICOVAGINAL ANCILLARY ONLY   Discharge instructions Prescribed diflucan 150 mg once daily and then second dose 72 hours later Metformin, lisinopril/HCTZ were refilled Take medications as prescribed and to completion We will follow  up with you regarding the results of your test If tests are positive, please abstain from sexual activity until you and your partner(s) are treated Follow up with PCP or [facility] if symptoms persists Return here or go to ER if you have any new or worsening symptoms    Reviewed expectations re: course of current medical issues. Questions answered. Outlined signs and symptoms indicating need for more acute intervention. Patient verbalized understanding. After Visit Summary given.       Emerson Monte, FNP 04/13/20 [id number]

## 2020-04-13 NOTE — ED Triage Notes (Signed)
Pt has not been taking BP medication or diabetes medication and has been without for months. Will need refill

## 2020-04-15 LAB — URINE CULTURE: Culture: NO GROWTH

## 2020-04-15 LAB — CERVICOVAGINAL ANCILLARY ONLY
Bacterial Vaginitis (gardnerella): POSITIVE — AB
Candida Glabrata: NEGATIVE
Candida Vaginitis: POSITIVE — AB
Chlamydia: NEGATIVE
Comment: NEGATIVE
Comment: NEGATIVE
Comment: NEGATIVE
Comment: NEGATIVE
Comment: NEGATIVE
Comment: NORMAL
Neisseria Gonorrhea: NEGATIVE
Trichomonas: NEGATIVE

## 2020-04-16 ENCOUNTER — Telehealth (HOSPITAL_COMMUNITY): Payer: Self-pay | Admitting: Orthopedic Surgery

## 2020-04-16 MED ORDER — METRONIDAZOLE 500 MG PO TABS
500.0000 mg | ORAL_TABLET | Freq: Two times a day (BID) | ORAL | 0 refills | Status: DC
Start: 2020-04-16 — End: 2020-05-08

## 2020-05-08 ENCOUNTER — Other Ambulatory Visit (HOSPITAL_COMMUNITY)
Admission: RE | Admit: 2020-05-08 | Discharge: 2020-05-08 | Disposition: A | Discharge status: Home / Self Care | Payer: Medicaid Other | Source: Ambulatory Visit | Attending: Obstetrics and Gynecology | Admitting: Obstetrics and Gynecology

## 2020-05-08 ENCOUNTER — Encounter: Payer: Self-pay | Admitting: Obstetrics and Gynecology

## 2020-05-08 ENCOUNTER — Ambulatory Visit (INDEPENDENT_AMBULATORY_CARE_PROVIDER_SITE_OTHER): Payer: Self-pay | Admitting: Obstetrics and Gynecology

## 2020-05-08 ENCOUNTER — Other Ambulatory Visit: Payer: Self-pay

## 2020-05-08 VITALS — BP 151/102 | HR 106 | Ht 65.0 in | Wt 198.6 lb

## 2020-05-08 DIAGNOSIS — D251 Intramural leiomyoma of uterus: Secondary | ICD-10-CM

## 2020-05-08 DIAGNOSIS — D252 Subserosal leiomyoma of uterus: Secondary | ICD-10-CM | POA: Diagnosis not present

## 2020-05-08 DIAGNOSIS — Z124 Encounter for screening for malignant neoplasm of cervix: Secondary | ICD-10-CM | POA: Diagnosis present

## 2020-05-08 NOTE — Progress Notes (Signed)
PATIENT ID: Mallory Mcdowell, female     DOB: 08-Aug-1986, 34 y.o.     MRN: [id number]   [facility] Visit  05/08/20     PATIENT NAME: Mallory Mcdowell     MRN [id number]     DOB: 1986-09-13  CC & HPI:   Chief Complaint  Patient presents with  . Discuss Fibroids   Mallory Mcdowell is a 34 y.o. female presenting today to discuss her fibroids.   She notes that her periods are affected by her fibroids and that her cycle will generally last between 8 or 9 days.   ROS:  Review of Systems  Constitutional: Negative.   HENT: Negative.   Eyes: Negative.   Respiratory: Negative.   Cardiovascular: Negative.   Gastrointestinal: Negative.   Genitourinary: Negative.   Musculoskeletal: Negative.   Skin: Negative.   Neurological: Negative.   Endo/Heme/Allergies: Negative.   Psychiatric/Behavioral: Negative.   All other systems reviewed and are negative.   Pertinent History Reviewed:  Reviewed: Significant for fibroids Medical         Past Medical History:  Diagnosis Date  . Diabetes mellitus without complication ([place])   . Hypertension                               Surgical Hx:   History reviewed. No pertinent surgical history. Medications: Reviewed & Updated - see associated section                       Current Outpatient Medications:  .  metFORMIN (GLUCOPHAGE) 500 MG tablet, Take 1 tablet (500 mg total) by mouth 2 (two) times daily with a meal., Disp: 60 tablet, Rfl: 0 .  lisinopril-hydrochlorothiazide (ZESTORETIC) 20-12.5 MG tablet, Take 1 tablet by mouth daily. (Patient not taking: Reported on 05/08/2020), Disp: 30 tablet, Rfl: 0   Social History: Reviewed -  reports that she has quit smoking. Her smoking use included cigarettes. She smoked 0.50 packs per day. She has never used smokeless tobacco.  Objective Findings:  Vitals: Blood pressure (!) 151/102, pulse (!) 106, height 5\' 5"  (1.651 m), weight 198 lb 9.6 oz (90.1 kg), last menstrual period  04/24/2020.  PHYSICAL EXAMINATION General appearance - alert, well appearing, and in no distress, oriented to person, place, and time, overweight and well hydrated Mental status - alert, oriented to person, place, and time, normal mood, behavior, speech, dress, motor activity, and thought processes, affect appropriate to mood Chest - not examined Heart - not examined Abdomen - soft, nontender, nondistended, no masses or organomegaly exam limited by body habitus  On bimanual the uterus is enlarged with 14-16 wk sized uterus Breasts - not examined Skin - normal coloration and turgor, no rashes, no suspicious skin lesions noted  PELVIC External genitalia - normal efg Vulva - normal for age Vagina - normal Cervix - deviated to the pt left  Uterus - fills pelvis with largest portion of uterus on the pt right, pushing cervix and LUS to the left and anterior.  Adnexa - obscured by fibroids [place] - n/a  Pap done Rectal - rectal exam not indicated  Assessment & Plan:   A:  1. Uterine enlatgement. 2. Heavy menses due to fibroids.   P:  1.  Schedule TV/TA U/S 2. Consider Mallory Mcdowell for menstrual control.   By signing my name below, I, [employer], attest that this documentation has been prepared  under the direction and in the presence of Jonnie Kind, MD. Electronically Signed: [place]. 05/08/20. 11:27 AM.

## 2020-05-09 LAB — CYTOLOGY - PAP
Chlamydia: NEGATIVE
Comment: NEGATIVE
Comment: NEGATIVE
Comment: NORMAL
Diagnosis: NEGATIVE
High risk HPV: NEGATIVE
Neisseria Gonorrhea: NEGATIVE

## 2020-05-21 ENCOUNTER — Other Ambulatory Visit: Payer: Self-pay | Admitting: Obstetrics and Gynecology

## 2020-05-21 DIAGNOSIS — D252 Subserosal leiomyoma of uterus: Secondary | ICD-10-CM

## 2020-05-22 ENCOUNTER — Ambulatory Visit (INDEPENDENT_AMBULATORY_CARE_PROVIDER_SITE_OTHER): Payer: Self-pay

## 2020-05-22 ENCOUNTER — Other Ambulatory Visit: Payer: Self-pay

## 2020-05-22 DIAGNOSIS — D252 Subserosal leiomyoma of uterus: Secondary | ICD-10-CM

## 2020-05-22 DIAGNOSIS — D251 Intramural leiomyoma of uterus: Secondary | ICD-10-CM

## 2020-05-22 NOTE — Progress Notes (Signed)
PELVIC US TA/TV:enlarged heterogeneous anteverted uterus with mult fibroids,(#1) posterior subserosal fibroid 9.5 x 7.3 x 9.3 cm,(#2) anterior subserosal fibroid 5.1 x 4.1 x 4.8 cm,(#3) anterior left subserosal fibroid 4.2 x 3.1 x 3.8 cm,EEC 9.1 mm,normal ovaries,no free fluid,no pain during ultrasound,difficult to slide ovaries because of location

## 2020-05-27 ENCOUNTER — Other Ambulatory Visit: Payer: Self-pay

## 2020-05-27 ENCOUNTER — Encounter (HOSPITAL_COMMUNITY): Payer: Self-pay | Admitting: *Deleted

## 2020-05-27 ENCOUNTER — Emergency Department (HOSPITAL_COMMUNITY)
Admission: EM | Admit: 2020-05-27 | Discharge: 2020-05-27 | Disposition: A | Discharge status: Home / Self Care | Payer: Medicaid Other | Attending: Emergency Medicine | Admitting: Emergency Medicine

## 2020-05-27 DIAGNOSIS — N899 Noninflammatory disorder of vagina, unspecified: Secondary | ICD-10-CM | POA: Insufficient documentation

## 2020-05-27 DIAGNOSIS — R739 Hyperglycemia, unspecified: Secondary | ICD-10-CM

## 2020-05-27 DIAGNOSIS — Z87891 Personal history of nicotine dependence: Secondary | ICD-10-CM | POA: Insufficient documentation

## 2020-05-27 DIAGNOSIS — Z79899 Other long term (current) drug therapy: Secondary | ICD-10-CM | POA: Insufficient documentation

## 2020-05-27 DIAGNOSIS — Z7984 Long term (current) use of oral hypoglycemic drugs: Secondary | ICD-10-CM | POA: Insufficient documentation

## 2020-05-27 DIAGNOSIS — N898 Other specified noninflammatory disorders of vagina: Secondary | ICD-10-CM

## 2020-05-27 DIAGNOSIS — I10 Essential (primary) hypertension: Secondary | ICD-10-CM | POA: Insufficient documentation

## 2020-05-27 LAB — CBC
HCT: 41.3 % (ref 36.0–46.0)
Hemoglobin: 13.2 g/dL (ref 12.0–15.0)
MCH: 27.4 pg (ref 26.0–34.0)
MCHC: 32 g/dL (ref 30.0–36.0)
MCV: 85.9 fL (ref 80.0–100.0)
Platelets: 438 10*3/uL — ABNORMAL HIGH (ref 150–400)
RBC: 4.81 MIL/uL (ref 3.87–5.11)
RDW: 13.2 % (ref 11.5–15.5)
WBC: 6.9 10*3/uL (ref 4.0–10.5)
nRBC: 0 % (ref 0.0–0.2)

## 2020-05-27 LAB — URINALYSIS, ROUTINE W REFLEX MICROSCOPIC
Bacteria, UA: NONE SEEN
Bilirubin Urine: NEGATIVE
Glucose, UA: >=500 mg/dL — AB
Hgb urine dipstick: NEGATIVE
Ketones, ur: 20 mg/dL — AB
Leukocytes,Ua: NEGATIVE
Nitrite: NEGATIVE
Protein, ur: NEGATIVE mg/dL
Specific Gravity, Urine: 1.031 — ABNORMAL HIGH (ref 1.005–1.030)
pH: 5 (ref 5.0–8.0)

## 2020-05-27 LAB — BASIC METABOLIC PANEL
Anion gap: 10 (ref 5–15)
BUN: 12 mg/dL (ref 6–20)
CO2: 24 mmol/L (ref 22–32)
Calcium: 9.1 mg/dL (ref 8.9–10.3)
Chloride: 102 mmol/L (ref 98–111)
Creatinine, Ser: 0.51 mg/dL (ref 0.44–1.00)
GFR calc Af Amer: >60 mL/min (ref >60)
GFR calc non Af Amer: >60 mL/min (ref >60)
Glucose, Bld: 330 mg/dL — ABNORMAL HIGH (ref 70–99)
Potassium: 3.7 mmol/L (ref 3.5–5.1)
Sodium: 136 mmol/L (ref 135–145)

## 2020-05-27 LAB — WET PREP, GENITAL
Clue Cells Wet Prep HPF POC: NONE SEEN
Sperm: NONE SEEN
Trich, Wet Prep: NONE SEEN
Yeast Wet Prep HPF POC: NONE SEEN

## 2020-05-27 LAB — CBG MONITORING, ED: Glucose-Capillary: 374 mg/dL — ABNORMAL HIGH (ref 70–99)

## 2020-05-27 LAB — HIV ANTIBODY (ROUTINE TESTING W REFLEX): HIV Screen 4th Generation wRfx: NONREACTIVE

## 2020-05-27 LAB — PREGNANCY, URINE: Preg Test, Ur: NEGATIVE

## 2020-05-27 MED ORDER — FLUCONAZOLE 150 MG PO TABS
150.0000 mg | ORAL_TABLET | Freq: Once | ORAL | 0 refills | Status: DC
Start: 2020-05-30 — End: 2020-05-30

## 2020-05-27 MED ORDER — CLOTRIMAZOLE 1 % EX CREA: 1.0000 "application " | TOPICAL_CREAM | Freq: Two times a day (BID) | CUTANEOUS | 0 refills | Status: DC

## 2020-05-27 MED ORDER — METFORMIN HCL 500 MG PO TABS: 500.0000 mg | ORAL_TABLET | Freq: Two times a day (BID) | ORAL | 0 refills | Status: DC

## 2020-05-27 MED ORDER — SODIUM CHLORIDE 0.9 % IV BOLUS
1000.0000 mL | Freq: Once | INTRAVENOUS | Status: AC
  Administered 2020-05-27: 1000 mL via INTRAVENOUS

## 2020-05-27 MED ORDER — LISINOPRIL-HYDROCHLOROTHIAZIDE 20-12.5 MG PO TABS: 1.0000 | ORAL_TABLET | Freq: Every day | ORAL | 0 refills | Status: DC

## 2020-05-27 MED ORDER — FLUCONAZOLE 150 MG PO TABS
150.0000 mg | ORAL_TABLET | Freq: Once | ORAL | Status: AC
  Administered 2020-05-27: 150 mg via ORAL
  Filled 2020-05-27: qty 1

## 2020-05-27 NOTE — ED Triage Notes (Signed)
Vaginal itching, non complaint diabetic, has been out of metformin for at least 2 weeks

## 2020-05-27 NOTE — ED Provider Notes (Signed)
[place] Provider Note   CSN: [id number] Arrival date & time: 05/27/20  1150     History Chief Complaint  Patient presents with  . Vaginal Itching    Mallory Mcdowell is a 34 y.o. female with a history of T2DM & hypertension who presents to the ED with complaints of vaginal irritation for the past several days. Patient states she is having pruritus to the vaginal area that is constant, no alleviating/aggravating factors, has tried topical abx ointment & Vaseline without much relief. She feels she is irritating the skin from all of the scratching. She had similar sxs when her blood sugar was high. Unsure what her sugars have been at home. Has had some polyuria/polydipsia. Denies fever, chills, pelvic pain, vaginal discharge, vomiting, or dysuria. Just finished her period. She is sexually active in a monogamous relationship without concern for STI. She has been out of her metformin and BP medication for 2 weeks.   HPI     Past Medical History:  Diagnosis Date  . Diabetes mellitus without complication ([place])   . Hypertension     Patient Active Problem List   Diagnosis Date Noted  . Encounter for physical examination, contraception, and Papanicolaou smear 12/24/2016  . Family planning 12/24/2016  . Cigarette nicotine dependence without complication 62/13/0865  . Uncontrolled diabetes mellitus type 2 without complications 78/46/9629  . Elevated platelet count 05/01/2016  . Alcohol abuse 05/01/2016    History reviewed. No pertinent surgical history.   OB History    Gravida  0   Para  0   Term  0   Preterm  0   AB  0   Living  0     SAB  0   TAB  0   Ectopic  0   Multiple  0   Live Births              Family History  Problem Relation Age of Onset  . Asthma Mother   . Diabetes Other   . Heart failure Other   . Cancer Other     Social History   Tobacco Use  . Smoking status: Former Smoker    Packs/day: 0.50    Types:  Cigarettes  . Smokeless tobacco: Never Used  Vaping Use  . Vaping Use: Never used  Substance Use Topics  . Alcohol use: Yes    Alcohol/week: 1.0 standard drink    Types: 1 Cans of beer per week    Comment: sometimes daily, but not always  . Drug use: Yes    Types: Marijuana    Home Medications Prior to Admission medications   Medication Sig Start Date End Date Taking? Authorizing Provider  lisinopril-hydrochlorothiazide (ZESTORETIC) 20-12.5 MG tablet Take 1 tablet by mouth daily. Patient not taking: Reported on 05/08/2020 04/13/20   Emerson Monte, FNP  metFORMIN (GLUCOPHAGE) 500 MG tablet Take 1 tablet (500 mg total) by mouth 2 (two) times daily with a meal. 04/13/20   Avegno, Darrelyn Hillock, FNP    Allergies    Amoxicillin  Review of Systems   Review of Systems  Constitutional: Negative for chills and fever.  Respiratory: Negative for shortness of breath.   Cardiovascular: Negative for chest pain.  Gastrointestinal: Negative for abdominal pain, constipation, nausea and vomiting.  Endocrine: Positive for polydipsia and polyuria.  Genitourinary: Positive for vaginal bleeding (menses, resolved) and vaginal pain (pruritus). Negative for dysuria, pelvic pain and vaginal discharge.  All other systems reviewed and are negative.  Physical Exam Updated Vital Signs BP (!) 134/94   Pulse (!) 103   Temp 98.4 F (36.9 C)   Resp 20   LMP 05/22/2020   SpO2 98%   Physical Exam Vitals and nursing note reviewed. Exam conducted with a chaperone present.  Constitutional:      General: She is not in acute distress.    Appearance: She is well-developed. She is not toxic-appearing.  HENT:     Head: Normocephalic and atraumatic.     Mouth/Throat:     Mouth: Mucous membranes are dry.  Eyes:     General:        Right eye: No discharge.        Left eye: No discharge.     Conjunctiva/sclera: Conjunctivae normal.  Cardiovascular:     Rate and Rhythm: Normal rate and regular rhythm.    Pulmonary:     Effort: Pulmonary effort is normal. No respiratory distress.     Breath sounds: Normal breath sounds. No wheezing, rhonchi or rales.  Abdominal:     General: There is no distension.     Palpations: Abdomen is soft.     Tenderness: There is no abdominal tenderness. There is no guarding or rebound.  Genitourinary:    Vagina: Vaginal discharge (white ) present.     Cervix: No cervical motion tenderness.     Adnexa:        Right: No mass, tenderness or fullness.         Left: No mass, tenderness or fullness.       Comments: RN Apolonio Schneiders present as chaperone.  There is some skin breakdown present to the labia minora/majority with mild erythema. No vesicles, ulcerations, crepitus, or fluctuance.  Musculoskeletal:     Cervical back: Neck supple.  Skin:    General: Skin is warm and dry.     Findings: No rash.  Neurological:     Mental Status: She is alert.     Comments: Clear speech.   Psychiatric:        Behavior: Behavior normal.    ED Results / Procedures / Treatments   Labs (all labs ordered are listed, but only abnormal results are displayed) Labs Reviewed  WET PREP, GENITAL - Abnormal; Notable for the following components:      Result Value   WBC, Wet Prep HPF POC RARE (*)    All other components within normal limits  URINALYSIS, ROUTINE W REFLEX MICROSCOPIC - Abnormal; Notable for the following components:   Color, Urine STRAW (*)    Specific Gravity, Urine 1.031 (*)    Glucose, UA >=500 (*)    Ketones, ur 20 (*)    All other components within normal limits  BASIC METABOLIC PANEL - Abnormal; Notable for the following components:   Glucose, Bld 330 (*)    All other components within normal limits  CBC - Abnormal; Notable for the following components:   Platelets 438 (*)    All other components within normal limits  CBG MONITORING, ED - Abnormal; Notable for the following components:   Glucose-Capillary 374 (*)    All other components within normal limits   PREGNANCY, URINE  RPR  HIV ANTIBODY (ROUTINE TESTING W REFLEX)  GC/CHLAMYDIA PROBE AMP ([place]) NOT AT [facility]    EKG None  Radiology No results found.  Procedures Procedures (including critical care time)  Medications Ordered in ED Medications  sodium chloride 0.9 % bolus 1,000 mL (1,000 mLs Intravenous New Bag/Given 05/27/20 1410)  fluconazole (DIFLUCAN) tablet 150 mg (150 mg Oral Given 05/27/20 1505)    ED Course  I have reviewed the triage vital signs and the nursing notes.  Pertinent labs & imaging results that were available during my care of the patient were reviewed by me and considered in my medical decision making (see chart for details).    MDM Rules/Calculators/A&P                         Patient presents to the ED with complaints of vaginal irritation.  She is nontoxic, resting comfortably, mild tachycardia resolved on my exam, BP mildly elevated likely due to being out of her antihypertensive medication, doubt HTN emergency. On exam abdomen is nontender, there is no tenderness w/ bimanual- not consistent w/ PID. No visible ulcerations/vesicles, does not appear consistent w/ HSV or genital warts. Some erythema/skin breakdown present likely secondary to scratching. No abscess. Does not have appearance of superimposed bacterial infection. Not consistent w/ Fournier's gangrene.  Her CBG is elevated at 374.  Will check basic labs to rule out DKA and administer fluids for this.  Additional history obtained:  Additional history obtained from chart review & nursing note review.   Lab Tests:  I Ordered, reviewed, and interpreted labs, which included:  CBG: elevated @ 374 CBC: No anemia or leukocytosis.  BMP: Hyperglycemia without acidosis or anion gap elevation.  Preg test :Negative UA: No UTI. Mild ketonuria & glucosuria.  Wet prep: WBCs.   Work-up overall reassuring.  Not consistent w/ DKA at this time. Renal function preserved. No electrolyte derangement. UA  without UTI. Wet prep w/p yeast, BV, or trich. GC/chlamydia/RP/HIV pending, patient without concern for STD. While her wet prep is negative for yeast, question a yeast dermatitis to the labia. Will tx with topical anti-fungal and diflucan. Will provide refills of metformin & her lisinopril-hydrochlorathiazide. PCP follow up. I discussed results, treatment plan, need for follow-up, and return precautions with the patient. Provided opportunity for questions, patient confirmed understanding and is in agreement with plan.   Portions of this note were generated with [employer]. Dictation errors may occur despite best attempts at proofreading.  Final Clinical Impression(s) / ED Diagnoses Final diagnoses:  Hyperglycemia  Vaginal irritation    Rx / DC Orders ED Discharge Orders         Ordered    fluconazole (DIFLUCAN) 150 MG tablet   Once     Discontinue  Reprint     05/27/20 1456    lisinopril-hydrochlorothiazide (ZESTORETIC) 20-12.5 MG tablet  Daily     Discontinue  Reprint     05/27/20 1456    metFORMIN (GLUCOPHAGE) 500 MG tablet  2 times daily with meals     Discontinue  Reprint     05/27/20 1456    clotrimazole (LOTRIMIN) 1 % cream  2 times daily     Discontinue  Reprint     05/27/20 [place], Glynda Jaeger, PA-C 05/27/20 1517    Milton Ferguson, MD 05/28/20 [phone]

## 2020-05-27 NOTE — Discharge Instructions (Signed)
You were seen in the emergency department today for vaginal irritation/itching.  Your work-up was overall reassuring.  Your labs show that you do have high blood sugar, you were given fluids for this in the ER.  We are represcribing your Metformin as well as your blood pressure medication, please be sure to take these as prescribed.  Be sure to follow-up with a primary care provider for recheck of your blood sugar within the next 1 week.  We are sending you home with clotrimazole, a topical ointment to help with your vaginal irritation, please use this twice per day for the next 1 week.  You were also given a dose of Diflucan in the emergency department for this, would like you to repeat this dose in 3 days if your vaginal irritation persist.  We have prescribed you new medication(s) today. Discuss the medications prescribed today with your pharmacist as they can have adverse effects and interactions with your other medicines including over the counter and prescribed medications. Seek medical evaluation if you start to experience new or abnormal symptoms after taking one of these medicines, seek care immediately if you start to experience difficulty breathing, feeling of your throat closing, facial swelling, or rash as these could be indications of a more serious allergic reaction  Please follow-up with your primary care provider or OB/GYN within 3 days for reevaluation.  Return to the ED for new or worsening symptoms including but not limited to worsening itching, pain, fever, redness, vaginal discharge, pelvic pain, or any other concerns.

## 2020-05-27 NOTE — Clinical Social Work Note (Signed)
Transition of Care [facility]) - Emergency Department Mini Assessment  Patient Details  Name: Mallory Mcdowell MRN: [id number] Date of Birth: 07/13/1986  Transition of Care [facility]) CM/SW Contact:    Sherie Don, LCSW Phone Number: 05/27/2020, 2:02 PM  Clinical Narrative: Patient is a 34 year old female who presented to the ED for an allergic reaction. Per chart review, patient does not currently have a PCP or insurance. CSW spoke with patient regarding referrals to [facility] for PCP assistance and the financial counselor to determine possible Medicaid eligibility. Patient agreeable to both referrals. CSW called [facility] and made referral. CSW referred patient to financial counselor. TOC signing off.  ED Mini Assessment: What brought you to the Emergency Department? : Allergic reaction Barriers to Discharge: ED Barriers Resolved Barrier interventions: Referrals to [facility] and financial counselor Means of departure: Car Interventions which prevented an admission or readmission: Other (must enter comment) (Referrals to [facility] and the financial counselor)  Patient Contact and Communications Key Contact 1: [facility] Key Contact 2: Financial counselor Tito Dine) Contact Date: 05/27/20      Admission diagnosis:  Allergic Reaction Patient Active Problem List   Diagnosis Date Noted  . Encounter for physical examination, contraception, and Papanicolaou smear 12/24/2016  . Family planning 12/24/2016  . Cigarette nicotine dependence without complication 20/72/1828  . Uncontrolled diabetes mellitus type 2 without complications 83/37/4451  . Elevated platelet count 05/01/2016  . Alcohol abuse 05/01/2016   PCP:  Patient, No Pcp Per Pharmacy:   [facility] [address] [place] - [address] [place] [address] Phone: [phone] Fax: [phone]

## 2020-05-28 LAB — GC/CHLAMYDIA PROBE AMP (~~LOC~~) NOT AT ARMC
Chlamydia: NEGATIVE
Comment: NEGATIVE
Comment: NORMAL
Neisseria Gonorrhea: NEGATIVE

## 2020-05-28 LAB — RPR: RPR Ser Ql: NONREACTIVE

## 2020-05-30 ENCOUNTER — Ambulatory Visit (INDEPENDENT_AMBULATORY_CARE_PROVIDER_SITE_OTHER): Payer: Self-pay | Admitting: Obstetrics and Gynecology

## 2020-05-30 ENCOUNTER — Other Ambulatory Visit: Payer: Self-pay

## 2020-05-30 ENCOUNTER — Encounter: Payer: Self-pay | Admitting: Obstetrics and Gynecology

## 2020-05-30 ENCOUNTER — Telehealth: Payer: Self-pay | Admitting: Obstetrics and Gynecology

## 2020-05-30 VITALS — BP 133/85 | HR 101 | Ht 65.0 in | Wt 191.2 lb

## 2020-05-30 DIAGNOSIS — D252 Subserosal leiomyoma of uterus: Secondary | ICD-10-CM

## 2020-05-30 DIAGNOSIS — D251 Intramural leiomyoma of uterus: Secondary | ICD-10-CM

## 2020-05-30 DIAGNOSIS — N979 Female infertility, unspecified: Secondary | ICD-10-CM

## 2020-05-30 NOTE — Telephone Encounter (Signed)
Patient would like someone to let her know how large her fibroids are. Patient states she forgot to ask provider during her visit today

## 2020-05-30 NOTE — Progress Notes (Signed)
Patient ID: Mallory Mcdowell, female   DOB: May 28, 1986, 34 y.o.   MRN: [id number]    [place] [place] [facility] Visit  @DATE @            Patient name: Mallory Mcdowell MRN [id number]  Date of birth: 18-Jun-1986  CC & HPI:  Mallory Mcdowell is a 34 y.o. female presenting today to review the results on her ultrasound on 05/22/2020 which was completed for uterine enlargement and fibroids. Her periods usually last 7-9 days. She has pain on the first couple of days of her periods. Her last period ended three days ago. She uses a period tracker app. Denies any recent anemia. She would like to become pregnant in the near future. Her insurance starts on 06/09/2020. The patient's partner says that he has a congenital inguinal hernia. The patient denies fever, chills or any other symptoms or complaints at this time.   ROS:  ROS  + long lasting periods, pain on the first couple of days - fever - chills All systems are negative except as noted in the HPI and PMH.   Pertinent History Reviewed:   Reviewed: Medical         Past Medical History:  Diagnosis Date  . Diabetes mellitus without complication ([place])   . Hypertension                               Surgical Hx:   History reviewed. No pertinent surgical history. Medications: Reviewed & Updated - see associated section                       Current Outpatient Medications:  .  clotrimazole (LOTRIMIN) 1 % cream, Apply 1 application topically 2 (two) times daily., Disp: 30 g, Rfl: 0 .  lisinopril-hydrochlorothiazide (ZESTORETIC) 20-12.5 MG tablet, Take 1 tablet by mouth daily., Disp: 30 tablet, Rfl: 0 .  metFORMIN (GLUCOPHAGE) 500 MG tablet, Take 1 tablet (500 mg total) by mouth 2 (two) times daily with a meal., Disp: 60 tablet, Rfl: 0   Social History: Reviewed -  reports that she has quit smoking. Her smoking use included cigarettes. She smoked 0.50 packs per day. She has never used smokeless tobacco.  Objective Findings:  Vitals: Blood pressure  (!) 133/85, pulse 101, height 5\' 5"  (1.651 m), weight 191 lb 3.2 oz (86.7 kg), last menstrual period 05/22/2020.  PHYSICAL EXAMINATION General appearance - alert, well appearing, and in no distress, oriented to person, place, and time and overweight Mental status - alert, oriented to person, place, and time, normal mood, behavior, speech, dress, motor activity, and thought processes, affect appropriate to mood   GYNECOLOGIC SONOGRAM   Mallory Mcdowell is a 34 y.o. G0P0000 Patient's last menstrual period was 04/24/2020 (exact date). She is here for a pelvic sonogram to evaluate fibroids.  Uterus                      13 x 7.9 x 9.7 cm, Total uterine volume 519 cc,enlarged heterogeneous anteverted uterus with mult fibroids,(#1) posterior subserosal fibroid 9.5 x 7.3 x 9.3 cm,(#2) anterior subserosal fibroid 5.1 x 4.1 x 4.8 cm,(#3) anterior left subserosal fibroid 4.2 x 3.1 x 3.8 cm  Endometrium          9.1 mm, symmetrical, wnl  Right ovary             3 x 2.4 x 2.4  cm, wnl  Left ovary                3.5 x 1.3 x 3.2 cm, wnl  No free fluid   Technician Comments:  PELVIC US TA/TV:enlarged heterogeneous anteverted uterus with mult fibroids,(#1) posterior subserosal fibroid 9.5 x 7.3 x 9.3 cm,(#2) anterior subserosal fibroid 5.1 x 4.1 x 4.8 cm,(#3) anterior left subserosal fibroid 4.2 x 3.1 x 3.8 cm,EEC 9.1 mm,normal ovaries,no free fluid,no pain during ultrasound,difficult to slide ovaries because of location  [employer] 05/22/2020 1:24 PM    PELVIC DEFERRED  Assessment & Plan:   A:  1.  Uterine fibroids  P:  1. Send information to [facility] for semen analysis 2. HSG test next week, will call patient  By signing my name below, I, De Burrs, attest that this documentation has been prepared under the direction and in the presence of Jonnie Kind, MD. Electronically Signed: De Burrs, Medical Scribe. 05/30/20. 3:40 PM.  I  personally performed the services described in this documentation, which was SCRIBED in my presence. The recorded information has been reviewed and considered accurate. It has been edited as necessary during review. Jonnie Kind, MD

## 2020-05-30 NOTE — Telephone Encounter (Signed)
Gave pt the measurements of the fibroids. Pt has enlarged uterus probably due to fibroids. Pt's questions answered and voiced understanding. [place]

## 2020-05-30 NOTE — Patient Instructions (Signed)
Please call [facility] to make an appointment for a semen anaylsis: [phone]

## 2020-06-03 ENCOUNTER — Telehealth: Payer: Self-pay

## 2020-06-03 NOTE — Telephone Encounter (Signed)
2nd follow up call was attempted with pt answering  but phone call dropped.    A return call was instantly made but to continue the conversation with details of [facility] program and enrollment invitation, however pt did not answer of call .   A message was sent via text message with program flyer, details and contact information for pt to review if still interested

## 2020-06-03 NOTE — Telephone Encounter (Signed)
Attempted to follow up with patient regarding scheduling of Enrollment/Eligibilty appointment with  [place] program for Uninsured.   Call deemed unsuccessful and was not able to speak to patient. Program services and contact information was sent via text message

## 2020-06-04 ENCOUNTER — Telehealth: Payer: Self-pay | Admitting: Obstetrics and Gynecology

## 2020-06-04 NOTE — Telephone Encounter (Signed)
Patient states that she would like to speak to Dr. Glo Herring regarding setting an appointment to check her tubes before sx in august.

## 2020-06-04 NOTE — Telephone Encounter (Signed)
Pt is wanting an appt to check tubes to see if she can have a baby. Pt was advised I will talk to Dr. Glo Herring when he comes in this afternoon. [place]

## 2020-06-06 NOTE — Telephone Encounter (Signed)
Appt was scheduled for pt to see Dr. Glo Herring to discuss. [place]

## 2020-06-07 ENCOUNTER — Telehealth: Payer: Self-pay | Admitting: *Deleted

## 2020-06-07 NOTE — Telephone Encounter (Signed)
Patient informed that I have spoken with Dr Glo Herring and he requested she call when she starts her next period in order for [place] to get her scheduled for HSG on day 5-11 following her period.   Also gave information regarding semen analysis home collection with [facility]. Self pay $199 +$75 shipping fee. Can send referral to [facility] if desired.  Patient states she will discuss with partner and get back to [place].

## 2020-06-20 ENCOUNTER — Encounter: Payer: Self-pay | Admitting: Obstetrics and Gynecology

## 2020-06-20 ENCOUNTER — Ambulatory Visit (INDEPENDENT_AMBULATORY_CARE_PROVIDER_SITE_OTHER): Payer: 59 | Admitting: Obstetrics and Gynecology

## 2020-06-20 VITALS — BP 124/82 | HR 117 | Wt 191.4 lb

## 2020-06-20 DIAGNOSIS — N979 Female infertility, unspecified: Secondary | ICD-10-CM | POA: Diagnosis not present

## 2020-06-20 DIAGNOSIS — D251 Intramural leiomyoma of uterus: Secondary | ICD-10-CM

## 2020-06-20 DIAGNOSIS — D252 Subserosal leiomyoma of uterus: Secondary | ICD-10-CM

## 2020-06-20 NOTE — Patient Instructions (Addendum)
[facility] [address], [place], [place] [address]   [phone]  We are pleased to offer you the following services as part of evaluation and management of infertility.  Please note that we are specialized in general obstetrics and gynecology, and we are not infertility specialists who had to undergo additional years of training.  If you desire, we can give you an outright referral to an infertility specialist.  Prior to initiating any therapy for infertility, we require that you undergo a hysterosalpingogram (HSG) to evaluate your reproductive system and your female partner has to undergo a semen analysis.  These results need to be received by our clinic before we recommend any treatments.  The hysterosalpingogram and semen analysis can be performed at the following places; prices are approximate and are subject to change:  1) The [facility] Semen Analysis: Not offered Hysterosalpingogram: Facility Fee $600  [place] Fee $230 This is the discounted self-pay price; needs to be paid up front.  HYSTEROSALPINGOGRAM CAN BE DONE AT [facility] ALSO.  2) [facility]   [phone]   [address] [facility] [address], [place] [address] DR JEFFREY DEATON Semen Analysis: [phone] Hysterosalpingogram: Performed at [place]  [place] Fee $305     Self pay patients get 20% of the reading fee and 40% discount off the facility fee if bill is is paid up front  3) [facility] [place] [phone]  DR Neldon Labella [address], [place], [place] [address] Semen Analysis:  $120 Hysterosalpingogram: Performed in their facility  Facility Fee $1200  [place] Fee $398  Self pay patients get 40% discount off the facility fee if bill is is paid up front [facility] [address], [place], [place] [address]   [phone]  We are pleased to offer you the  following services as part of evaluation and management of infertility.  Please note that we are specialized in general obstetrics and gynecology, and we are not infertility specialists who had to undergo additional years of training.  If you desire, we can give you an outright referral to an infertility specialist.  Prior to initiating any therapy for infertility, we require that you undergo a hysterosalpingogram (HSG) to evaluate your reproductive system and your female partner has to undergo a semen analysis.  These results need to be received by our clinic before we recommend any treatments.  The hysterosalpingogram and semen analysis can be performed at the following places; prices are approximate and are subject to change:  1) [facility] Semen Analysis: Not offered Hysterosalpingogram: Facility Fee $600  [place] Fee $230 This is the discounted self-pay price; needs to be paid up front.  HYSTEROSALPINGOGRAM CAN BE DONE AT [facility] ALSO.  2) [facility]   [phone]   [address] [facility] [address], [place] [address] DR JEFFREY DEATON Semen Analysis: [phone] Hysterosalpingogram: Performed at [place] Fee [phone]  [place] Fee $305     Self pay patients get 20% of the reading fee and 40% discount off the facility fee if bill is is paid up front  3) [facility] [place] [phone]  DR Neldon Labella [address], [place], [place] [address] Semen Analysis:  $120 Hysterosalpingogram: Performed in their facility  Facility Fee $1200  [place] Fee [phone]  Self pay patients get 40% discount off the facility fee if bill is is  paid up front  After these results are obtained; we will evaluate them and recommend appropriate subsequent therapy which may involving medications and/or surgery. There is a $200 fee for initial consultation; and $100 fee per visit for any subsequent visits.  Thank you for choosing [place] and our services.   Please call [facility] at [phone] for any further questions or concerns regarding infertility management or other gynecologic issues. After these results are obtained; we will evaluate them and recommend appropriate subsequent therapy which may involving medications and/or surgery. There is a $200 fee for initial consultation; and $100 fee per visit for any subsequent visits.  Thank you for choosing [place] and our services.  Please call [facility] at [phone] for any further questions or concerns regarding infertility management or other gynecologic issues.  [facility] can do semen analysis for your female partner.

## 2020-06-20 NOTE — Progress Notes (Signed)
Patient ID: KAHLEY LEIB, female   DOB: September 25, 1986, 34 y.o.   MRN: [id number]    [facility] Visit  @DATE @            Patient name: Mallory Mcdowell MRN [id number]  Date of birth: 1986/06/10  CC & HPI:  Mallory Mcdowell is a 33 y.o. female presenting today to discuss fertility. She is interested in having her tubes evaluated. Her periods are regular. She has never had any thyroid problems.  She does have family history of prominent eyes  The patient is on Metformin for diabetes. She checks her sugars about twice each month. On 05/27/2020, her blood sugar was 374, but this was when she had run out of medications.  She is not NOT checked her blood sugar since resuming her Metformin  the patient denies fever, chills or any other symptoms or complaints at this time.   ROS:  ROS - fever - chills All systems are negative except as noted in the HPI and PMH.   Pertinent History Reviewed:   Reviewed: Medical         Past Medical History:  Diagnosis Date  . Diabetes mellitus without complication ([place])   . Hypertension                               Surgical Hx:   No past surgical history on file. Medications: Reviewed & Updated - see associated section                       Current Outpatient Medications:  .  clotrimazole (LOTRIMIN) 1 % cream, Apply 1 application topically 2 (two) times daily., Disp: 30 g, Rfl: 0 .  lisinopril-hydrochlorothiazide (ZESTORETIC) 20-12.5 MG tablet, Take 1 tablet by mouth daily., Disp: 30 tablet, Rfl: 0 .  metFORMIN (GLUCOPHAGE) 500 MG tablet, Take 1 tablet (500 mg total) by mouth 2 (two) times daily with a meal., Disp: 60 tablet, Rfl: 0   Social History: Reviewed -  reports that she has quit smoking. Her smoking use included cigarettes. She smoked 0.50 packs per day. She has never used smokeless tobacco.  Objective Findings:  Vitals: Last menstrual period 05/22/2020.  PHYSICAL EXAMINATION General appearance - alert, well appearing, and in no  distress, oriented to person, place, and time and overweight Mental status - alert, oriented to person, place, and time, normal mood, behavior, speech, dress, motor activity, and thought processes, affect appropriate to mood Thyroid: normal size Has not been seen in the interval since  PELVIC DEFERRED Records reviewed GC and Chlamydia been negative.  X3 this year Assessment & Plan:   A: Primary infertility 1. Uterine Fibroids 500 g size up to 9 cm fibroids per recent ultrasound 2. Fertility Discussion  P:  1. Plan for HSG next week. Patient will call when her period begins. 2. Semen analysis for partner at [facility]  By signing my name below, I, De Burrs, attest that this documentation has been prepared under the direction and in the presence of Jonnie Kind, MD. Electronically Signed: De Burrs, Medical Scribe. 06/20/20. 11:16 AM.  I personally performed the services described in this documentation, which was SCRIBED in my presence. The recorded information has been reviewed and considered accurate. It has been edited as necessary during review. Jonnie Kind, MD

## 2020-06-25 ENCOUNTER — Ambulatory Visit (INDEPENDENT_AMBULATORY_CARE_PROVIDER_SITE_OTHER): Payer: 59 | Admitting: Obstetrics and Gynecology

## 2020-06-25 ENCOUNTER — Other Ambulatory Visit: Payer: Self-pay

## 2020-06-25 ENCOUNTER — Encounter: Payer: Self-pay | Admitting: Obstetrics and Gynecology

## 2020-06-25 VITALS — BP 131/82 | HR 106 | Ht 65.0 in | Wt 191.2 lb

## 2020-06-25 DIAGNOSIS — N979 Female infertility, unspecified: Secondary | ICD-10-CM | POA: Diagnosis not present

## 2020-06-25 NOTE — Progress Notes (Signed)
PATIENT ID: Mallory Mcdowell, female     DOB: 1985-12-05, 34 y.o.     MRN: [id number]   [facility] Visit  06/25/20     PATIENT NAME: Mallory Mcdowell     MRN [id number]     DOB: April 25, 1986  CC & HPI:  No chief complaint on file.  Mallory Mcdowell is a 34 y.o. female presenting today for for an HSG evaluation.  Her last menstrual period was last Friday, 06/21/2020.  She has longstanding primary infertility, and is known to have several uterine fibroids.  she desires to move forward with evaluation, and wants to be assured that there is no tubal occlusion before involving partner in w/u (Semen Analysis)    ROS:  Review of Systems  Constitutional: Negative.   HENT: Negative.   Eyes: Negative.   Respiratory: Negative.   Cardiovascular: Negative.   Gastrointestinal: Negative.   Endocrine: Negative.   Genitourinary: Negative.   Musculoskeletal: Negative.   Skin: Negative.   Allergic/Immunologic: Negative.   Neurological: Negative.   Hematological: Negative.   Psychiatric/Behavioral: Negative.   All other systems reviewed and are negative.    Pertinent History Reviewed:  Medical         Past Medical History:  Diagnosis Date   Diabetes mellitus without complication ([place])    Hypertension                               Surgical Hx:   No past surgical history on file. Medications: Reviewed & Updated - see associated section                       Current Outpatient Medications:    clotrimazole (LOTRIMIN) 1 % cream, Apply 1 application topically 2 (two) times daily., Disp: 30 g, Rfl: 0   lisinopril-hydrochlorothiazide (ZESTORETIC) 20-12.5 MG tablet, Take 1 tablet by mouth daily., Disp: 30 tablet, Rfl: 0   metFORMIN (GLUCOPHAGE) 500 MG tablet, Take 1 tablet (500 mg total) by mouth 2 (two) times daily with a meal., Disp: 60 tablet, Rfl: 0   Social History: Reviewed -  reports that she has quit smoking. Her smoking use included cigarettes. She smoked 0.50 packs per  day. She has never used smokeless tobacco.  Objective Findings:  Vitals: There were no vitals taken for this visit.  PHYSICAL EXAMINATION General appearance - alert, well appearing, and in no distress and oriented to person, place, and time Mental status - alert, oriented to person, place, and time Chest - clear to auscultation, no wheezes, rales or rhonchi, symmetric air entry, not examined Heart - normal rate and regular rhythm Abdomen - soft, nontender, nondistended, no masses or organomegaly Obese , exam limited Breasts - n/a Skin - normal coloration and turgor, no rashes, no suspicious skin lesions noted  PELVIC Recent negative GC and CHL  Assessment & Plan:   A:  1.  primary infertility 2. Uterine fibroids 500 gm uterus  P:  1.   Obtain HSG as per pt desire, then proceed with referral for consideration of Myomectomy by Reproductive Endocrinology   By signing my name below, I, [employer], attest that this documentation has been prepared under the direction and in the presence of Jonnie Kind, MD. Electronically Signed: [place]. 06/25/20. 1:18 PM.  I personally performed the services described in this documentation, which was SCRIBED in my presence. The recorded information has  been reviewed and considered accurate. It has been edited as necessary during review. Jonnie Kind, MD

## 2020-06-26 ENCOUNTER — Ambulatory Visit (HOSPITAL_COMMUNITY)
Admission: RE | Admit: 2020-06-26 | Discharge: 2020-06-26 | Disposition: A | Discharge status: Home / Self Care | Payer: 59 | Source: Ambulatory Visit | Attending: Obstetrics and Gynecology | Admitting: Obstetrics and Gynecology

## 2020-06-26 ENCOUNTER — Other Ambulatory Visit: Payer: Self-pay | Admitting: Obstetrics and Gynecology

## 2020-06-26 ENCOUNTER — Telehealth: Payer: Self-pay | Admitting: Obstetrics and Gynecology

## 2020-06-26 DIAGNOSIS — N979 Female infertility, unspecified: Secondary | ICD-10-CM | POA: Diagnosis not present

## 2020-06-26 MED ORDER — DOXYCYCLINE HYCLATE 100 MG PO CAPS: 100.0000 mg | ORAL_CAPSULE | Freq: Two times a day (BID) | ORAL | 0 refills | Status: DC

## 2020-06-26 MED ORDER — IOHEXOL 300 MG/ML  SOLN
30.0000 mL | Freq: Once | INTRAMUSCULAR | Status: AC | PRN
  Administered 2020-06-26: 30 mL

## 2020-06-26 MED ORDER — POVIDONE-IODINE 10 % EX SOLN
CUTANEOUS | Status: AC
  Administered 2020-06-26: 1
  Filled 2020-06-26: qty 15

## 2020-06-26 NOTE — Telephone Encounter (Signed)
Patient called stating that Dr. Glo Herring was suppose to call her in an antibiotic yesterday but her pharmacy states that they have not received it as of yet. Please contact pt when sent.

## 2020-06-26 NOTE — Progress Notes (Signed)
Antibiotic called in for coverage for HSG.  Doxycycline 100 bid x 3 d

## 2020-07-02 ENCOUNTER — Telehealth: Payer: Self-pay | Admitting: Obstetrics and Gynecology

## 2020-07-02 NOTE — Telephone Encounter (Signed)
Patient called in saying she needs a refill on her metformin and her high blood pressure medicine.

## 2020-07-02 NOTE — Telephone Encounter (Signed)
Pt needs a refill on Metformin and BP med. Thanks!! [place]

## 2020-07-03 ENCOUNTER — Other Ambulatory Visit: Payer: Self-pay | Admitting: Obstetrics & Gynecology

## 2020-07-03 ENCOUNTER — Telehealth: Payer: Self-pay | Admitting: Obstetrics and Gynecology

## 2020-07-03 MED ORDER — LISINOPRIL-HYDROCHLOROTHIAZIDE 20-12.5 MG PO TABS
1.0000 | ORAL_TABLET | Freq: Every day | ORAL | 3 refills | Status: DC
Start: 2020-07-03 — End: 2020-11-15

## 2020-07-03 MED ORDER — METFORMIN HCL 500 MG PO TABS: 500.0000 mg | ORAL_TABLET | Freq: Two times a day (BID) | ORAL | 3 refills | Status: DC

## 2020-07-03 NOTE — Telephone Encounter (Signed)
Patient called stating that she has went to the pharmacy to see if her medication was ready, the pharmacy told patient that they have never received a prescription. Please contact pharmacy regarding medication. Pt is going to wait at the pharmacy

## 2020-07-03 NOTE — Telephone Encounter (Signed)
refilled 

## 2020-07-03 NOTE — Telephone Encounter (Signed)
[facility] in [place] does have prescriptions and will fill them for pt to pick up. [place]

## 2020-07-04 NOTE — Progress Notes (Signed)
The uterus was unusually enlarged, and while normally 3 cc of contrast will fill the endometrial cavity, 8 cc of contrast did not result in any dye reaching the fallopian tubes. At 15 cc , bilateral tubal spillage is noted in both tubes.Endometrial cavity is smoothly contoured. No fibroid deformity of endometrial cavity noted. All but 3 cc of contrast able to be aspirated at the end of the procedure. Doxycycline given pro phylactically to prevent infection.

## 2020-07-12 ENCOUNTER — Ambulatory Visit: Payer: Self-pay | Admitting: *Deleted

## 2020-07-12 NOTE — Telephone Encounter (Signed)
Attempted to contact patient left VM to go to ER for any BS issues. Left nurse line number for her to return call.

## 2020-07-12 NOTE — Telephone Encounter (Signed)
C/o elevated glucose of 410. Symptoms frequent urination and vaginal irritation. Denies blurred vision now but does report blurred vision at times with fruity taste in her mouth. D/C from [facility] last week and reports glucose at D/C less than 350. Patient reports she feels fine but irritated in vaginal area. Noted patient med for doxycylcline. No PCP. Care advise given to drink 8 oz. Water and walk and recheck glucose. If still elevated call back or go to UC or ED if symptoms worsen. Patient verbalized understanding .   Reason for Disposition . Blood glucose > 400 mg/dL (22.2 mmol/L)  Answer Assessment - Initial Assessment Questions 1. BLOOD GLUCOSE: "What is your blood glucose level?"      410 2. ONSET: "When did you check the blood glucose?"     Just now  3. USUAL RANGE: "What is your glucose level usually?" (e.g., usual fasting morning value, usual evening value)     330 last time at hospital [facility] last week  4. KETONES: "Do you check for ketones (urine or blood test strips)?" If yes, ask: "What does the test show now?"      na 5. TYPE 1 or 2:  "Do you know what type of diabetes you have?"  (e.g., Type 1, Type 2, Gestational; doesn't know)      Type 2  6. INSULIN: "Do you take insulin?" "What type of insulin(s) do you use? What is the mode of delivery? (syringe, pen; injection or pump)?"      No  7. DIABETES PILLS: "Do you take any pills for your diabetes?" If yes, ask: "Have you missed taking any pills recently?"      Metformin  8. OTHER SYMPTOMS: "Do you have any symptoms?" (e.g., fever, frequent urination, difficulty breathing, dizziness, weakness, vomiting)     Frequent urination, some blurred vision but not now  9. PREGNANCY: "Is there any chance you are pregnant?" "When was your last menstrual period?"     No  Protocols used: DIABETES - HIGH BLOOD SUGAR-A-AH

## 2020-07-12 NOTE — Telephone Encounter (Signed)
Contacted patient on her cell at [phone].  Patient was driving her car at the time of this call.  Patient stated she has not yet re-checked her blood sugar.  She plans to drink more water and then re-check it.  Instructed patient to go to local ER if her Blood Sugar is still greater than 400. Patient stated it should be lower than 400 now, because she has not had anything to eat.  This RN reminded her that if there is an infection in her body that can make her blood sugar elevated whether she has had anything to eat or not.  Patient stated she did not want to wait to be seen in the ER.  This RN reminded her that the triage nurse could check her blood sugar and draw labs while she is waiting to be seen by the doctor.  Patient stated she would go home and check her blood sugar and if it is greater than 400 she will go to the ER.

## 2020-08-03 ENCOUNTER — Ambulatory Visit
Admission: EM | Admit: 2020-08-03 | Discharge: 2020-08-03 | Disposition: A | Discharge status: Home / Self Care | Payer: 59 | Attending: Emergency Medicine | Admitting: Emergency Medicine

## 2020-08-03 ENCOUNTER — Encounter: Payer: Self-pay | Admitting: Emergency Medicine

## 2020-08-03 DIAGNOSIS — N898 Other specified noninflammatory disorders of vagina: Secondary | ICD-10-CM | POA: Insufficient documentation

## 2020-08-03 DIAGNOSIS — B9689 Other specified bacterial agents as the cause of diseases classified elsewhere: Secondary | ICD-10-CM | POA: Diagnosis not present

## 2020-08-03 DIAGNOSIS — Z113 Encounter for screening for infections with a predominantly sexual mode of transmission: Secondary | ICD-10-CM | POA: Diagnosis not present

## 2020-08-03 DIAGNOSIS — N760 Acute vaginitis: Secondary | ICD-10-CM | POA: Diagnosis present

## 2020-08-03 MED ORDER — METRONIDAZOLE 500 MG PO TABS: 500.0000 mg | ORAL_TABLET | Freq: Two times a day (BID) | ORAL | 0 refills | Status: DC

## 2020-08-03 NOTE — Discharge Instructions (Addendum)
Vaginal self-swab obtained.  We will follow up with you regarding abnormal results Prescribed metronidazole 500 mg twice daily for 7 days (do not take while consuming alcohol and/or if breastfeeding) Take medications as prescribed and to completion If tests results are positive, please abstain from sexual activity until you and your partner(s) have been treated Follow up with PCP or [place] if symptoms persists Return here or go to ER if you have any new or worsening symptoms fever, chills, nausea, vomiting, abdominal or pelvic pain, painful intercourse, vaginal discharge, vaginal bleeding, persistent symptoms despite treatment, etc..[name]

## 2020-08-03 NOTE — ED Triage Notes (Signed)
Patient states that she has thick discharge x 2-3 days- thinks it BV LMP- 2 weeks ago.

## 2020-08-03 NOTE — ED Provider Notes (Signed)
[place]   [id number] 08/03/20 Arrival Time: 1435   [id number] DISCHARGE  SUBJECTIVE:  Mallory Mcdowell is a 34 y.o. female who presented to the urgent care with a complaint of vaginal discharge for the past 2-3 days.  She denies a precipitating event, recent sexual encounter or recent antibiotic use.  Patient is sexually active with 1 female partners.  Describes discharge as thin white.  She has tried OTC medications without relief.  Denies aggravating factor.  Reports similar symptoms in the past and was diagnosed with BV and treated accordingly.  She denies fever, chills, nausea, vomiting, abdominal or pelvic pain, urinary symptoms, vaginal itching, vaginal odor, vaginal bleeding, dyspareunia, vaginal rashes or lesions.   No LMP recorded. Current birth control method: Compliant with BC:  ROS: As per HPI.  All other pertinent ROS negative.     Past Medical History:  Diagnosis Date  . Diabetes mellitus without complication ([place])   . Hypertension    History reviewed. No pertinent surgical history. Allergies  Allergen Reactions  . Amoxicillin Swelling   No current facility-administered medications on file prior to encounter.   Current Outpatient Medications on File Prior to Encounter  Medication Sig Dispense Refill  . clotrimazole (LOTRIMIN) 1 % cream Apply 1 application topically 2 (two) times daily. (Patient not taking: Reported on 06/25/2020) 30 g 0  . doxycycline (VIBRAMYCIN) 100 MG capsule Take 1 capsule (100 mg total) by mouth 2 (two) times daily. 6 capsule 0  . lisinopril-hydrochlorothiazide (ZESTORETIC) 20-12.5 MG tablet Take 1 tablet by mouth daily. 30 tablet 3  . metFORMIN (GLUCOPHAGE) 500 MG tablet Take 1 tablet (500 mg total) by mouth 2 (two) times daily with a meal. 60 tablet 3    Social History   Socioeconomic History  . Marital status: Single    Spouse name: Not on file  . Number of children: Not on file  . Years of education: Not on file  .  Highest education level: Not on file  Occupational History  . Not on file  Tobacco Use  . Smoking status: Former Smoker    Packs/day: 0.50    Types: Cigarettes  . Smokeless tobacco: Never Used  Vaping Use  . Vaping Use: Never used  Substance and Sexual Activity  . Alcohol use: Yes    Alcohol/week: 1.0 standard drink    Types: 1 Cans of beer per week    Comment: sometimes daily, but not always  . Drug use: Yes    Types: Marijuana  . Sexual activity: Yes    Birth control/protection: None  Other Topics Concern  . Not on file  Social History Narrative  . Not on file   Social Determinants of Health   Financial Resource Strain:   . Difficulty of Paying Living Expenses: Not on file  Food Insecurity: Food Insecurity Present  . Worried About Charity fundraiser in the Last Year: Sometimes true  . Ran Out of Food in the Last Year: Sometimes true  Transportation Needs: No Transportation Needs  . Lack of Transportation (Medical): No  . Lack of Transportation (Non-Medical): No  Physical Activity: Insufficiently Active  . Days of Exercise per Week: 3 days  . Minutes of Exercise per Session: 10 min  Stress: Stress Concern Present  . Feeling of Stress : Rather much  Social Connections: Moderately Integrated  . Frequency of Communication with Friends and Family: More than three times a week  . Frequency of Social Gatherings with Friends and Family: More  than three times a week  . Attends Religious Services: 1 to 4 times per year  . Active Member of Clubs or Organizations: Yes  . Attends Archivist Meetings: 1 to 4 times per year  . Marital Status: Never married  Intimate Partner Violence: Not At Risk  . Fear of Current or Ex-Partner: No  . Emotionally Abused: No  . Physically Abused: No  . Sexually Abused: No   Family History  Problem Relation Age of Onset  . Asthma Mother   . Diabetes Other   . Heart failure Other   . Cancer Other     OBJECTIVE:  Vitals:    08/03/20 1607  BP: 119/78  Pulse: 73  Resp: 16  Temp: 98 F (36.7 C)  SpO2: 98%     General appearance: Alert, NAD, appears stated age Head: NCAT Throat: lips, mucosa, and tongue normal; teeth and gums normal Lungs: CTA bilaterally without adventitious breath sounds Heart: regular rate and rhythm.  Radial pulses 2+ symmetrical bilaterally Back: no CVA tenderness Abdomen: soft, non-tender; bowel sounds normal; no masses or organomegaly; no guarding or rebound tenderness GU: Cervical self swab obtained Skin: warm and dry Psychological:  Alert and cooperative. Normal mood and affect.  LABS:  Results for orders placed or performed during the hospital encounter of 05/27/20  Wet prep, genital   Specimen: Cervix  Result Value Ref Range   Yeast Wet Prep HPF POC NONE SEEN NONE SEEN   Trich, Wet Prep NONE SEEN NONE SEEN   Clue Cells Wet Prep HPF POC NONE SEEN NONE SEEN   WBC, Wet Prep HPF POC RARE (A) NONE SEEN   Sperm NONE SEEN   Urinalysis, Routine w reflex microscopic  Result Value Ref Range   Color, Urine STRAW (A) YELLOW   APPearance CLEAR CLEAR   Specific Gravity, Urine 1.031 (H) 1.005 - 1.030   pH 5.0 5.0 - 8.0   Glucose, UA >=500 (A) NEGATIVE mg/dL   Hgb urine dipstick NEGATIVE NEGATIVE   Bilirubin Urine NEGATIVE NEGATIVE   Ketones, ur 20 (A) NEGATIVE mg/dL   Protein, ur NEGATIVE NEGATIVE mg/dL   Nitrite NEGATIVE NEGATIVE   Leukocytes,Ua NEGATIVE NEGATIVE   WBC, UA 0-5 0 - 5 WBC/hpf   Bacteria, UA NONE SEEN NONE SEEN   Squamous Epithelial / LPF 0-5 0 - 5  Basic metabolic panel  Result Value Ref Range   Sodium 136 135 - 145 mmol/L   Potassium 3.7 3.5 - 5.1 mmol/L   Chloride 102 98 - 111 mmol/L   CO2 24 22 - 32 mmol/L   Glucose, Bld 330 (H) 70 - 99 mg/dL   BUN 12 6 - 20 mg/dL   Creatinine, Ser 0.51 0.44 - 1.00 mg/dL   Calcium 9.1 8.9 - 10.3 mg/dL   GFR calc non Af Amer >60 >60 mL/min   GFR calc Af Amer >60 >60 mL/min   Anion gap 10 5 - 15  CBC  Result Value Ref  Range   WBC 6.9 4.0 - 10.5 K/uL   RBC 4.81 3.87 - 5.11 MIL/uL   Hemoglobin 13.2 12.0 - 15.0 g/dL   HCT 41.3 36 - 46 %   MCV 85.9 80.0 - 100.0 fL   MCH 27.4 26.0 - 34.0 pg   MCHC 32.0 30.0 - 36.0 g/dL   RDW 13.2 11.5 - 15.5 %   Platelets 438 (H) 150 - 400 K/uL   nRBC 0.0 0.0 - 0.2 %  RPR  Result Value Ref  Range   RPR Ser Ql NON REACTIVE NON REACTIVE  HIV Antibody (routine testing w rflx)  Result Value Ref Range   HIV Screen 4th Generation wRfx Non Reactive Non Reactive  Pregnancy, urine  Result Value Ref Range   Preg Test, Ur NEGATIVE NEGATIVE  CBG monitoring, ED  Result Value Ref Range   Glucose-Capillary 374 (H) 70 - 99 mg/dL  GC/Chlamydia probe amp  Result Value Ref Range   Chlamydia Negative    Neisseria Gonorrhea Negative    Comment Normal Reference [place] Chlamydia - Negative    Comment      Normal Reference Range Neisseria Gonorrhea - Negative    Labs Reviewed  CERVICOVAGINAL ANCILLARY ONLY    ASSESSMENT & PLAN:  1. Vaginal discharge   2. Screening examination for STD (sexually transmitted disease)   3. Bacterial vaginosis     Meds ordered this encounter  Medications  . metroNIDAZOLE (FLAGYL) 500 MG tablet    Sig: Take 1 tablet (500 mg total) by mouth 2 (two) times daily.    Dispense:  14 tablet    Refill:  0    Pending: Labs Reviewed  CERVICOVAGINAL ANCILLARY ONLY    Vaginal self-swab obtained.  We will follow up with you regarding abnormal results Prescribed metronidazole 500 mg twice daily for 7 days (do not take while consuming alcohol and/or if breastfeeding) Take medications as prescribed and to completion If tests results are positive, please abstain from sexual activity until you and your partner(s) have been treated Follow up with PCP or [place] if symptoms persists Return here or go to ER if you have any new or worsening symptoms fever, chills, nausea, vomiting, abdominal or pelvic pain, painful intercourse, vaginal discharge,  vaginal bleeding, persistent symptoms despite treatment, etc...  Reviewed expectations re: course of current medical issues. Questions answered. Outlined signs and symptoms indicating need for more acute intervention. Patient verbalized understanding. After Visit Summary given.       Emerson Monte, [place] 08/03/20 1646

## 2020-08-05 LAB — CERVICOVAGINAL ANCILLARY ONLY
Bacterial Vaginitis (gardnerella): POSITIVE — AB
Candida Glabrata: NEGATIVE
Candida Vaginitis: POSITIVE — AB
Chlamydia: NEGATIVE
Comment: NEGATIVE
Comment: NEGATIVE
Comment: NEGATIVE
Comment: NEGATIVE
Comment: NEGATIVE
Comment: NORMAL
Neisseria Gonorrhea: NEGATIVE
Trichomonas: NEGATIVE

## 2020-08-07 ENCOUNTER — Telehealth (HOSPITAL_COMMUNITY): Payer: Self-pay | Admitting: Emergency Medicine

## 2020-08-07 MED ORDER — FLUCONAZOLE 150 MG PO TABS: 150.0000 mg | ORAL_TABLET | Freq: Once | ORAL | 0 refills | Status: AC

## 2020-08-13 ENCOUNTER — Telehealth: Payer: Self-pay | Admitting: Obstetrics & Gynecology

## 2020-08-13 NOTE — Telephone Encounter (Signed)
Sent patient mychart message asking her to send mychart message to Dr Elonda Husky and I would make sure that he got it.

## 2020-08-13 NOTE — Telephone Encounter (Signed)
Patient called stating that she was Dr. Johnnye Sima patient and he was going to refer her to see Dr. Elonda Husky for surgery when she spoke with him last on 06/26/2020. Pt states that she would like to speak with Dr. Elonda Husky, please contact pt at [phone]

## 2020-08-16 ENCOUNTER — Ambulatory Visit: Payer: 59 | Admitting: Obstetrics & Gynecology

## 2020-09-03 ENCOUNTER — Encounter: Payer: Self-pay | Admitting: Obstetrics & Gynecology

## 2020-09-03 ENCOUNTER — Other Ambulatory Visit: Payer: Self-pay | Admitting: *Deleted

## 2020-09-03 ENCOUNTER — Ambulatory Visit (INDEPENDENT_AMBULATORY_CARE_PROVIDER_SITE_OTHER): Payer: 59 | Admitting: Obstetrics & Gynecology

## 2020-09-03 VITALS — BP 140/94 | HR 84 | Ht 65.0 in | Wt 184.0 lb

## 2020-09-03 DIAGNOSIS — N979 Female infertility, unspecified: Secondary | ICD-10-CM

## 2020-09-03 DIAGNOSIS — E1100 Type 2 diabetes mellitus with hyperosmolarity without nonketotic hyperglycemic-hyperosmolar coma (NKHHC): Secondary | ICD-10-CM

## 2020-09-03 DIAGNOSIS — D219 Benign neoplasm of connective and other soft tissue, unspecified: Secondary | ICD-10-CM

## 2020-09-03 MED ORDER — BLOOD GLUCOSE MONITORING SUPPL W/DEVICE KIT: 1.0000 | PACK | Freq: Four times a day (QID) | 0 refills | Status: DC

## 2020-09-03 NOTE — Progress Notes (Signed)
Chief Complaint  Patient presents with  . discuss having surgery for fibroids      34 y.o. G0P0000 No LMP recorded. The current method of family planning is none.  Outpatient Encounter Medications as of 09/03/2020  Medication Sig  . lisinopril-hydrochlorothiazide (ZESTORETIC) 20-12.5 MG tablet Take 1 tablet by mouth daily.  . metFORMIN (GLUCOPHAGE) 500 MG tablet Take 1 tablet (500 mg total) by mouth 2 (two) times daily with a meal.  . [DISCONTINUED] clotrimazole (LOTRIMIN) 1 % cream Apply 1 application topically 2 (two) times daily. (Patient not taking: Reported on 06/25/2020)  . [DISCONTINUED] doxycycline (VIBRAMYCIN) 100 MG capsule Take 1 capsule (100 mg total) by mouth 2 (two) times daily.  . [DISCONTINUED] metroNIDAZOLE (FLAGYL) 500 MG tablet Take 1 tablet (500 mg total) by mouth 2 (two) times daily.   No facility-administered encounter medications on file as of 09/03/2020.    Subjective Patient is essentially here for follow-up visit post pelvic sonogram from July She is noted to have a an enlarged fibroid uterus 520 cc total volume which places her at approximately 16 weeks size Past Medical History:  Diagnosis Date  . Diabetes mellitus without complication ([place])   . Hypertension     History reviewed. No pertinent surgical history.  OB History    Gravida  0   Para  0   Term  0   Preterm  0   AB  0   Living  0     SAB  0   IAB  0   Ectopic  0   Multiple  0   Live Births              Allergies  Allergen Reactions  . Amoxicillin Swelling    Social History   Socioeconomic History  . Marital status: Single    Spouse name: Not on file  . Number of children: Not on file  . Years of education: Not on file  . Highest education level: Not on file  Occupational History  . Not on file  Tobacco Use  . Smoking status: Former Smoker    Packs/day: 0.50    Types: Cigarettes  . Smokeless tobacco: Never Used  Vaping Use  . Vaping Use: Never  used  Substance and Sexual Activity  . Alcohol use: Yes    Alcohol/week: 1.0 standard drink    Types: 1 Cans of beer per week    Comment: sometimes daily, but not always  . Drug use: Yes    Types: Marijuana  . Sexual activity: Yes    Birth control/protection: None  Other Topics Concern  . Not on file  Social History Narrative  . Not on file   Social Determinants of Health   Financial Resource Strain: Not on file  Food Insecurity: Food Insecurity Present  . Worried About Charity fundraiser in the Last Year: Sometimes true  . Ran Out of Food in the Last Year: Sometimes true  Transportation Needs: No Transportation Needs  . Lack of Transportation (Medical): No  . Lack of Transportation (Non-Medical): No  Physical Activity: Insufficiently Active  . Days of Exercise per Week: 3 days  . Minutes of Exercise per Session: 10 min  Stress: Stress Concern Present  . Feeling of Stress : Rather much  Social Connections: Moderately Integrated  . Frequency of Communication with Friends and Family: More than three times a week  . Frequency of Social Gatherings with Friends and Family: More than three times  a week  . Attends Religious Services: 1 to 4 times per year  . Active Member of Clubs or Organizations: Yes  . Attends Archivist Meetings: 1 to 4 times per year  . Marital Status: Never married    Family History  Problem Relation Age of Onset  . Asthma Mother   . Diabetes Other   . Heart failure Other   . Cancer Other     Medications:       Current Outpatient Medications:  .  lisinopril-hydrochlorothiazide (ZESTORETIC) 20-12.5 MG tablet, Take 1 tablet by mouth daily., Disp: 30 tablet, Rfl: 3 .  metFORMIN (GLUCOPHAGE) 500 MG tablet, Take 1 tablet (500 mg total) by mouth 2 (two) times daily with a meal., Disp: 60 tablet, Rfl: 3 .  benzonatate (TESSALON) 100 MG capsule, Take 1 capsule (100 mg total) by mouth every 8 (eight) hours., Disp: 30 capsule, Rfl: 0 .  Blood  Glucose Monitoring Suppl w/Device KIT, 1 each by Does not apply route in the morning, at noon, in the evening, and at bedtime. Check blood sugar 4 times daily, Disp: 1 kit, Rfl: 0 .  cetirizine (ZYRTEC ALLERGY) 10 MG tablet, Take 1 tablet (10 mg total) by mouth daily., Disp: 30 tablet, Rfl: 0 .  fluticasone (FLONASE) 50 MCG/ACT nasal spray, Place 1 spray into both nostrils daily for 14 days., Disp: 16 g, Rfl: 0  Objective Blood pressure (!) 140/94, pulse 84, height 5' 5" (1.651 m), weight 184 lb (83.5 kg).  Abdominal exam reveals easily palpable fibroid uterus  Pertinent ROS No burning with urination, frequency or urgency No nausea, vomiting or diarrhea Nor fever chills or other constitutional symptoms   Labs or studies GYNECOLOGIC SONOGRAM   Mallory Mcdowell is a 34 y.o. G0P0000 Patient's last menstrual period was 04/24/2020 (exact date). She is here for a pelvic sonogram to evaluate fibroids.  Uterus                      13 x 7.9 x 9.7 cm, Total uterine volume 519 cc,enlarged heterogeneous anteverted uterus with mult fibroids,(#1) posterior subserosal fibroid 9.5 x 7.3 x 9.3 cm,(#2) anterior subserosal fibroid 5.1 x 4.1 x 4.8 cm,(#3) anterior left subserosal fibroid 4.2 x 3.1 x 3.8 cm  Endometrium          9.1 mm, symmetrical, wnl  Right ovary             3 x 2.4 x 2.4 cm, wnl  Left ovary                3.5 x 1.3 x 3.2 cm, wnl  No free fluid   Technician Comments:  PELVIC US TA/TV:enlarged heterogeneous anteverted uterus with mult fibroids,(#1) posterior subserosal fibroid 9.5 x 7.3 x 9.3 cm,(#2) anterior subserosal fibroid 5.1 x 4.1 x 4.8 cm,(#3) anterior left subserosal fibroid 4.2 x 3.1 x 3.8 cm,EEC 9.1 mm,normal ovaries,no free fluid,no pain during ultrasound,difficult to slide ovaries because of location  [employer] 05/22/2020 1:24 PM Clinical Impression and recommendations:  I have reviewed the sonogram results above.  Combined with  the patient's current clinical course, below are my impressions and any appropriate recommendations for management based on the sonographic findings:   FINDINGS:                               Maternal uterus/ is  Irregular with multiple fibroids. The  endometrial stripe can be followed approximately 10 cm from the cervix os, and appears thin and rather straight and symmetric, but is obscured in the fundal area by shadowing by uterine fibroids.; intramural and subserosal location Measurements listed above.   adnexae: Ovaries are  within normal limits.                                                                       Right ovary small                                                                        Left ovary    sseen                                                                       Free Fluid  normal   IMPRESSION: enlarged utuerus with multiple intramural and serosal fibroids as listed above.                                       Normal adnexa , evaluation limited by uterine enlargement  Jonnie Kind 06/02/2020    Impression Diagnoses this Encounter::   ICD-10-CM   1. Type 2 diabetes mellitus with hyperosmolarity without coma, unspecified whether long term insulin use (HCC)  E11.00 HgB A1c  2. Infertility, female  N97.9   3. Fibroids  D21.9     Established relevant diagnosis(es):   Plan/Recommendations: No orders of the defined types were placed in this encounter.   Labs or Scans Ordered: Orders Placed This Encounter  Procedures  . HgB A1c    Management:: Patient understands that a myomectomy is a pretty significant surgery and carries with it at risk for approximately 10% chance of a hysterectomy She also has desire for future fertility As a result she will wait at this point and try to improve her diabetes control and A1c is drawn today prior to going to the OR She knows I made her requirement to improve and stabilize her blood sugar management before  we proceed with any surgery We will see her back in 3 months and recheck her A1c at that time  Follow up Return in about 3 months (around 12/04/2020) for Follow up, with Dr Elonda Husky.        Face to face time:  15 minutes  Greater than 50% of the visit time was spent in counseling and coordination of care with the patient.  The summary and outline of the counseling and care coordination is summarized in the note above.   All questions were answered.

## 2020-09-04 LAB — HEMOGLOBIN A1C
Est. average glucose Bld gHb Est-mCnc: 280 mg/dL
Hgb A1c MFr Bld: 11.4 % — ABNORMAL HIGH (ref 4.8–5.6)

## 2020-09-10 ENCOUNTER — Ambulatory Visit
Admission: EM | Admit: 2020-09-10 | Discharge: 2020-09-10 | Disposition: A | Discharge status: Home / Self Care | Payer: 59 | Attending: Emergency Medicine | Admitting: Emergency Medicine

## 2020-09-10 ENCOUNTER — Other Ambulatory Visit: Payer: Self-pay

## 2020-09-10 DIAGNOSIS — Z1152 Encounter for screening for COVID-19: Secondary | ICD-10-CM | POA: Diagnosis not present

## 2020-09-10 DIAGNOSIS — J069 Acute upper respiratory infection, unspecified: Secondary | ICD-10-CM

## 2020-09-10 MED ORDER — BENZONATATE 100 MG PO CAPS: 100.0000 mg | ORAL_CAPSULE | Freq: Three times a day (TID) | ORAL | 0 refills | Status: DC

## 2020-09-10 MED ORDER — CETIRIZINE HCL 10 MG PO TABS: 10.0000 mg | ORAL_TABLET | Freq: Every day | ORAL | 0 refills | Status: DC

## 2020-09-10 MED ORDER — DEXAMETHASONE 4 MG PO TABS: 4.0000 mg | ORAL_TABLET | Freq: Every day | ORAL | 0 refills | Status: AC

## 2020-09-10 MED ORDER — FLUTICASONE PROPIONATE 50 MCG/ACT NA SUSP: 1.0000 | Freq: Every day | NASAL | 0 refills | Status: DC

## 2020-09-10 NOTE — Discharge Instructions (Addendum)

## 2020-09-10 NOTE — ED Provider Notes (Signed)
[place]   [id number] 09/10/20 Arrival Time: 7893   CC: COVID symptoms  SUBJECTIVE: History from: patient.  Mallory Mcdowell is a 34 y.o. female who presents to the urgent care for complaint of cough nasal congestion after Covid exposure in the past 3 days.  Denies sick exposure to COVID, flu or strep.  Denies recent travel.  Has tried OTC medication with no relief.  Denies having factors.  Denies previous symptoms in the past.   Denies fever, chills, fatigue, sinus pain, rhinorrhea, sore throat, SOB, wheezing, chest pain, nausea, changes in bowel or bladder habits.     ROS: As per HPI.  All other pertinent ROS negative.      Past Medical History:  Diagnosis Date  . Diabetes mellitus without complication ([place])   . Hypertension    No past surgical history on file. Allergies  Allergen Reactions  . Amoxicillin Swelling   No current facility-administered medications on file prior to encounter.   Current Outpatient Medications on File Prior to Encounter  Medication Sig Dispense Refill  . Blood Glucose Monitoring Suppl w/Device KIT 1 each by Does not apply route in the morning, at noon, in the evening, and at bedtime. Check blood sugar 4 times daily 1 kit 0  . lisinopril-hydrochlorothiazide (ZESTORETIC) 20-12.5 MG tablet Take 1 tablet by mouth daily. 30 tablet 3  . metFORMIN (GLUCOPHAGE) 500 MG tablet Take 1 tablet (500 mg total) by mouth 2 (two) times daily with a meal. 60 tablet 3   Social History   Socioeconomic History  . Marital status: Single    Spouse name: Not on file  . Number of children: Not on file  . Years of education: Not on file  . Highest education level: Not on file  Occupational History  . Not on file  Tobacco Use  . Smoking status: Former Smoker    Packs/day: 0.50    Types: Cigarettes  . Smokeless tobacco: Never Used  Vaping Use  . Vaping Use: Never used  Substance and Sexual Activity  . Alcohol use: Yes    Alcohol/week: 1.0 standard  drink    Types: 1 Cans of beer per week    Comment: sometimes daily, but not always  . Drug use: Yes    Types: Marijuana  . Sexual activity: Yes    Birth control/protection: None  Other Topics Concern  . Not on file  Social History Narrative  . Not on file   Social Determinants of Health   Financial Resource Strain:   . Difficulty of Paying Living Expenses: Not on file  Food Insecurity: Food Insecurity Present  . Worried About Charity fundraiser in the Last Year: Sometimes true  . Ran Out of Food in the Last Year: Sometimes true  Transportation Needs: No Transportation Needs  . Lack of Transportation (Medical): No  . Lack of Transportation (Non-Medical): No  Physical Activity: Insufficiently Active  . Days of Exercise per Week: 3 days  . Minutes of Exercise per Session: 10 min  Stress: Stress Concern Present  . Feeling of Stress : Rather much  Social Connections: Moderately Integrated  . Frequency of Communication with Friends and Family: More than three times a week  . Frequency of Social Gatherings with Friends and Family: More than three times a week  . Attends Religious Services: 1 to 4 times per year  . Active Member of Clubs or Organizations: Yes  . Attends Archivist Meetings: 1 to 4 times per year  .  Marital Status: Never married  Intimate Partner Violence: Not At Risk  . Fear of Current or Ex-Partner: No  . Emotionally Abused: No  . Physically Abused: No  . Sexually Abused: No   Family History  Problem Relation Age of Onset  . Asthma Mother   . Diabetes Other   . Heart failure Other   . Cancer Other     OBJECTIVE:  Vitals:   09/10/20 1852  BP: 131/87  Pulse: 88  Resp: 20  Temp: 98.3 F (36.8 C)  SpO2: 98%     General appearance: alert; appears fatigued, but nontoxic; speaking in full sentences and tolerating own secretions HEENT: NCAT; Ears: EACs clear, TMs pearly gray; Eyes: PERRL.  EOM grossly intact. Sinuses: nontender; Nose: nares  patent without rhinorrhea, Throat: oropharynx clear, tonsils non erythematous or enlarged, uvula midline  Neck: supple without LAD Lungs: unlabored respirations, symmetrical air entry; cough: moderate; no respiratory distress; CTAB Heart: regular rate and rhythm.  Radial pulses 2+ symmetrical bilaterally Skin: warm and dry Psychological: alert and cooperative; normal mood and affect  LABS:  No results found for this or any previous visit (from the past 24 hour(s)).   ASSESSMENT & PLAN:  1. Encounter for screening for COVID-19   2. URI with cough and congestion     Meds ordered this encounter  Medications  . benzonatate (TESSALON) 100 MG capsule    Sig: Take 1 capsule (100 mg total) by mouth every 8 (eight) hours.    Dispense:  30 capsule    Refill:  0  . fluticasone (FLONASE) 50 MCG/ACT nasal spray    Sig: Place 1 spray into both nostrils daily for 14 days.    Dispense:  16 g    Refill:  0  . cetirizine (ZYRTEC ALLERGY) 10 MG tablet    Sig: Take 1 tablet (10 mg total) by mouth daily.    Dispense:  30 tablet    Refill:  0  . dexamethasone (DECADRON) 4 MG tablet    Sig: Take 1 tablet (4 mg total) by mouth daily for 7 days.    Dispense:  7 tablet    Refill:  0    Discharge instructions.    COVID testing ordered.  It will take between 2-7 days for test results.  Someone will contact you regarding abnormal results.    In the meantime: You should remain isolated in your home for 10 days from symptom onset AND greater than 24 hours after symptoms resolution (absence of fever without the use of fever-reducing medication and improvement in respiratory symptoms), whichever is longer Get plenty of rest and push fluids Tessalon Perles prescribed for cough Zyrtec for nasal congestion, runny nose, and/or sore throat Flonase for nasal congestion and runny nose Decadron was prescribed Use medications daily for symptom relief Use OTC medications like ibuprofen or tylenol as needed  fever or pain Call or go to the ED if you have any new or worsening symptoms such as fever, worsening cough, shortness of breath, chest tightness, chest pain, turning blue, changes in mental status, etc...   Reviewed expectations re: course of current medical issues. Questions answered. Outlined signs and symptoms indicating need for more acute intervention. Patient verbalized understanding. After Visit Summary given.         Emerson Monte, FNP 09/10/20 1858

## 2020-09-10 NOTE — ED Triage Notes (Signed)
Pt presents with nasal congestion and cough for past few days after covid exposure

## 2020-09-11 LAB — NOVEL CORONAVIRUS, NAA: SARS-CoV-2, NAA: DETECTED — AB

## 2020-09-11 LAB — SARS-COV-2, NAA 2 DAY TAT

## 2020-09-12 ENCOUNTER — Telehealth: Payer: Self-pay | Admitting: Nurse Practitioner

## 2020-09-12 NOTE — Telephone Encounter (Signed)
Called to discuss with Mallory Mcdowell about Covid symptoms and the use of the monoclonal antibody infusion for those with mild to moderate Covid symptoms and at a high risk of hospitalization.     Pt is qualified for this infusion due to co-morbid conditions (as indicated below) and/or a member of an at-risk group, however declines infusion at this time.  Symptoms tier reviewed as well as criteria for ending isolation.  Symptoms reviewed that would warrant ED/Hospital evaluation. Preventative practices reviewed. Patient verbalized understanding. Patient advised to call back if he decides that he does want to get infusion. Callback number to the infusion center given. Patient advised to go to Urgent care or ED with severe symptoms. Last date she would be eligible for infusion is 09/18/20.    Patient Active Problem List   Diagnosis Date Noted  . Encounter for physical examination, contraception, and Papanicolaou smear 12/24/2016  . Family planning 12/24/2016  . Cigarette nicotine dependence without complication 73/56/7014  . Uncontrolled diabetes mellitus type 2 without complications 09/07/1313  . Elevated platelet count 05/01/2016  . Alcohol abuse 05/01/2016    Mallory Mcdowell, AGPCNP-BC

## 2020-09-27 ENCOUNTER — Other Ambulatory Visit: Payer: 59

## 2020-10-02 ENCOUNTER — Other Ambulatory Visit: Payer: Self-pay

## 2020-10-02 ENCOUNTER — Ambulatory Visit
Admission: RE | Admit: 2020-10-02 | Discharge: 2020-10-02 | Disposition: A | Discharge status: Home / Self Care | Payer: 59 | Source: Ambulatory Visit | Attending: Emergency Medicine | Admitting: Emergency Medicine

## 2020-10-02 VITALS — BP 138/86 | HR 107 | Temp 98.8°F | Resp 20

## 2020-10-02 DIAGNOSIS — N898 Other specified noninflammatory disorders of vagina: Secondary | ICD-10-CM | POA: Insufficient documentation

## 2020-10-02 DIAGNOSIS — B373 Candidiasis of vulva and vagina: Secondary | ICD-10-CM | POA: Diagnosis present

## 2020-10-02 DIAGNOSIS — B3731 Acute candidiasis of vulva and vagina: Secondary | ICD-10-CM

## 2020-10-02 DIAGNOSIS — Z202 Contact with and (suspected) exposure to infections with a predominantly sexual mode of transmission: Secondary | ICD-10-CM | POA: Insufficient documentation

## 2020-10-02 LAB — POCT FASTING CBG KUC MANUAL ENTRY: POCT Glucose (KUC): 308 mg/dL — AB (ref 70–99)

## 2020-10-02 MED ORDER — FLUCONAZOLE 200 MG PO TABS: 200.0000 mg | ORAL_TABLET | Freq: Once | ORAL | 0 refills | Status: AC

## 2020-10-02 NOTE — ED Provider Notes (Signed)
[place]   [id number] 10/02/20 Arrival Time: 3532   DJ:MEQASTM DISCHARGE  SUBJECTIVE:  Mallory Mcdowell is a 34 y.o. female with history of type 2 diabetes, BV and yeast infection presented to the urgent care for complaint of vaginal irritation for the past 2 to 3 days.  She denies a precipitating event, recent sexual encounter or recent antibiotic use.  Patient is sexually active with 1 female partners. Currently denies vaginal discharge. She has tried OTC medications  without relief. Denies aggravating factors.  She reports similar symptoms in the past.   She denies fever, chills, nausea, vomiting, abdominal or pelvic pain, urinary symptoms, vaginal odor, vaginal bleeding, dyspareunia, vaginal rashes or lesions.   Patient's last menstrual period was 09/11/2020. Current birth control method: Compliant with BC:  ROS: As per HPI.  All other pertinent ROS negative.     Past Medical History:  Diagnosis Date   Diabetes mellitus without complication ([place])    Hypertension    History reviewed. No pertinent surgical history. Allergies  Allergen Reactions   Amoxicillin Swelling   No current facility-administered medications on file prior to encounter.   Current Outpatient Medications on File Prior to Encounter  Medication Sig Dispense Refill   benzonatate (TESSALON) 100 MG capsule Take 1 capsule (100 mg total) by mouth every 8 (eight) hours. 30 capsule 0   Blood Glucose Monitoring Suppl w/Device KIT 1 each by Does not apply route in the morning, at noon, in the evening, and at bedtime. Check blood sugar 4 times daily 1 kit 0   cetirizine (ZYRTEC ALLERGY) 10 MG tablet Take 1 tablet (10 mg total) by mouth daily. 30 tablet 0   fluticasone (FLONASE) 50 MCG/ACT nasal spray Place 1 spray into both nostrils daily for 14 days. 16 g 0   lisinopril-hydrochlorothiazide (ZESTORETIC) 20-12.5 MG tablet Take 1 tablet by mouth daily. 30 tablet 3   metFORMIN (GLUCOPHAGE) 500 MG  tablet Take 1 tablet (500 mg total) by mouth 2 (two) times daily with a meal. 60 tablet 3    Social History   Socioeconomic History   Marital status: Single    Spouse name: Not on file   Number of children: Not on file   Years of education: Not on file   Highest education level: Not on file  Occupational History   Not on file  Tobacco Use   Smoking status: Former Smoker    Packs/day: 0.50    Types: Cigarettes   Smokeless tobacco: Never Used  Vaping Use   Vaping Use: Never used  Substance and Sexual Activity   Alcohol use: Yes    Alcohol/week: 1.0 standard drink    Types: 1 Cans of beer per week    Comment: sometimes daily, but not always   Drug use: Yes    Types: Marijuana   Sexual activity: Yes    Birth control/protection: None  Other Topics Concern   Not on file  Social History Narrative   Not on file   Social Determinants of Health   Financial Resource Strain:    Difficulty of Paying Living Expenses: Not on file  Food Insecurity: Food Insecurity Present   Worried About [place] in the Last Year: Sometimes true   Ran Out of Food in the Last Year: Sometimes true  Transportation Needs: No Transportation Needs   Lack of Transportation (Medical): No   Lack of Transportation (Non-Medical): No  Physical Activity: Insufficiently Active   Days of Exercise per Week: 3  days   Minutes of Exercise per Session: 10 min  Stress: Stress Concern Present   Feeling of Stress : Rather much  Social Connections: Moderately Integrated   Frequency of Communication with Friends and Family: More than three times a week   Frequency of Social Gatherings with Friends and Family: More than three times a week   Attends Religious Services: 1 to 4 times per year   Active Member of [employer] or Organizations: Yes   Attends Archivist Meetings: 1 to 4 times per year   Marital Status: Never married  Human resources officer Violence: Not At Risk   Fear of  Current or Ex-Partner: No   Emotionally Abused: No   Physically Abused: No   Sexually Abused: No   Family History  Problem Relation Age of Onset   Asthma Mother    Diabetes Other    Heart failure Other    Cancer Other     OBJECTIVE:  Vitals:   10/02/20 0911  BP: 138/86  Pulse: (!) 107  Resp: 20  Temp: 98.8 F (37.1 C)  SpO2: 98%     General appearance: Alert, NAD, appears stated age Head: NCAT Throat: lips, mucosa, and tongue normal; teeth and gums normal Lungs: CTA bilaterally without adventitious breath sounds Heart: regular rate and rhythm.  Radial pulses 2+ symmetrical bilaterally Back: no CVA tenderness Abdomen: soft, non-tender; bowel sounds normal; no masses or organomegaly; no guarding or rebound tenderness GU: declines; vaginal self l swab obtained Skin: warm and dry Psychological:  Alert and cooperative. Normal mood and affect.  LABS:  Results for orders placed or performed during the hospital encounter of 09/10/20  Novel Coronavirus, NAA ([facility])   Specimen: Nasopharyngeal Swab; Nasopharyngeal(NP) swabs in vial transport medium   Nasopharynge  Result Value Ref Range   SARS-CoV-2, NAA Detected (A) Not Detected  SARS-COV-2, NAA 2 DAY TAT   Nasopharynge  Result Value Ref Range   SARS-CoV-2, NAA 2 DAY TAT Performed     Labs Reviewed  POCT FASTING CBG KUC MANUAL ENTRY  CERVICOVAGINAL ANCILLARY ONLY    ASSESSMENT & PLAN:  1. Vaginal irritation   2. STD exposure   3. Vaginal yeast infection     Meds ordered this encounter  Medications   fluconazole (DIFLUCAN) 200 MG tablet    Sig: Take 1 tablet (200 mg total) by mouth once for 1 dose. Take the second dose 72 hours after the first dose if symptom does not resolve    Dispense:  2 tablet    Refill:  0    Pending: Labs Reviewed  POCT FASTING CBG KUC MANUAL ENTRY  CERVICOVAGINAL ANCILLARY ONLY   Discharge instructions  Vaginal self-swab obtained.  We will follow up with you regarding  abnormal results Prescribed diflucan 200 mg once daily and then second dose 72 hours later Take medications as prescribed and to completion If tests results are positive, please abstain from sexual activity until you and your partner(s) have been treated Follow up with PCP or [place] if symptoms persists Return here or go to ER if you have any new or worsening symptoms fever, chills, nausea, vomiting, abdominal or pelvic pain, painful intercourse, vaginal discharge, vaginal bleeding, persistent symptoms despite treatment, etc...  Reviewed expectations re: course of current medical issues. Questions answered. Outlined signs and symptoms indicating need for more acute intervention. Patient verbalized understanding. After Visit Summary given.       Emerson Monte, [place] 10/02/20 [phone]

## 2020-10-02 NOTE — Discharge Instructions (Addendum)
Vaginal self-swab obtained.  We will follow up with you regarding abnormal results Prescribed diflucan 200 mg once daily and then second dose 72 hours later Take medications as prescribed and to completion If tests results are positive, please abstain from sexual activity until you and your partner(s) have been treated Follow up with PCP or [place] if symptoms persists Return here or go to ER if you have any new or worsening symptoms fever, chills, nausea, vomiting, abdominal or pelvic pain, painful intercourse, vaginal discharge, vaginal bleeding, persistent symptoms despite treatment, etc..[name]

## 2020-10-02 NOTE — ED Triage Notes (Signed)
Pt presents with vaginal irritation she believes to be yeast infection

## 2020-10-04 LAB — CERVICOVAGINAL ANCILLARY ONLY
Bacterial Vaginitis (gardnerella): NEGATIVE
Candida Glabrata: NEGATIVE
Candida Vaginitis: POSITIVE — AB
Chlamydia: NEGATIVE
Comment: NEGATIVE
Comment: NEGATIVE
Comment: NEGATIVE
Comment: NEGATIVE
Comment: NEGATIVE
Comment: NORMAL
Neisseria Gonorrhea: NEGATIVE
Trichomonas: NEGATIVE

## 2020-10-09 ENCOUNTER — Ambulatory Visit: Payer: 59 | Admitting: Nutrition

## 2020-11-13 ENCOUNTER — Ambulatory Visit: Payer: 59 | Admitting: Nutrition

## 2020-11-14 ENCOUNTER — Encounter: Payer: Medicaid Other | Attending: Obstetrics & Gynecology | Admitting: Nutrition

## 2020-11-14 ENCOUNTER — Telehealth: Payer: Self-pay | Admitting: Nutrition

## 2020-11-14 ENCOUNTER — Ambulatory Visit: Payer: Medicaid Other | Admitting: Nurse Practitioner

## 2020-11-14 NOTE — Telephone Encounter (Signed)
vm left to call back and reschedule appointment from today due to computer issues.

## 2020-11-15 ENCOUNTER — Other Ambulatory Visit: Payer: Self-pay | Admitting: Obstetrics & Gynecology

## 2020-11-19 ENCOUNTER — Other Ambulatory Visit: Payer: Medicaid Other

## 2020-11-26 ENCOUNTER — Telehealth: Payer: Self-pay | Admitting: Nutrition

## 2020-11-26 ENCOUNTER — Encounter: Payer: Medicaid Other | Admitting: Nutrition

## 2020-11-26 NOTE — Telephone Encounter (Signed)
TC to patient. She said she was going to try to get on to her mychart account. She didn't get on line . Left VM to call back to see if I could help her get on her mychart account.

## 2020-11-28 ENCOUNTER — Other Ambulatory Visit: Payer: Self-pay | Admitting: Obstetrics & Gynecology

## 2020-11-28 ENCOUNTER — Telehealth: Payer: Medicaid Other | Admitting: Nutrition

## 2020-12-05 ENCOUNTER — Ambulatory Visit: Payer: 59 | Admitting: Obstetrics & Gynecology

## 2020-12-19 ENCOUNTER — Other Ambulatory Visit: Payer: Self-pay

## 2020-12-19 ENCOUNTER — Ambulatory Visit
Admission: RE | Admit: 2020-12-19 | Discharge: 2020-12-19 | Disposition: A | Discharge status: Home / Self Care | Payer: Medicaid Other | Source: Ambulatory Visit | Attending: Family Medicine | Admitting: Family Medicine

## 2020-12-19 VITALS — BP 139/77 | HR 112 | Temp 98.0°F | Resp 18

## 2020-12-19 DIAGNOSIS — R109 Unspecified abdominal pain: Secondary | ICD-10-CM | POA: Insufficient documentation

## 2020-12-19 DIAGNOSIS — N898 Other specified noninflammatory disorders of vagina: Secondary | ICD-10-CM | POA: Insufficient documentation

## 2020-12-19 DIAGNOSIS — R739 Hyperglycemia, unspecified: Secondary | ICD-10-CM | POA: Insufficient documentation

## 2020-12-19 DIAGNOSIS — R000 Tachycardia, unspecified: Secondary | ICD-10-CM | POA: Insufficient documentation

## 2020-12-19 DIAGNOSIS — M545 Low back pain, unspecified: Secondary | ICD-10-CM | POA: Insufficient documentation

## 2020-12-19 LAB — POCT URINALYSIS DIP (MANUAL ENTRY)
Bilirubin, UA: NEGATIVE
Glucose, UA: 100 mg/dL — AB
Ketones, POC UA: NEGATIVE mg/dL
Leukocytes, UA: NEGATIVE
Nitrite, UA: NEGATIVE
Protein Ur, POC: NEGATIVE mg/dL
Spec Grav, UA: 1.025 (ref 1.010–1.025)
Urobilinogen, UA: 0.2 E.U./dL
pH, UA: 5 (ref 5.0–8.0)

## 2020-12-19 LAB — POCT FASTING CBG KUC MANUAL ENTRY: POCT Glucose (KUC): 234 mg/dL — AB (ref 70–99)

## 2020-12-19 MED ORDER — MELOXICAM 15 MG PO TABS: 15.0000 mg | ORAL_TABLET | Freq: Every day | ORAL | 0 refills | Status: DC

## 2020-12-19 MED ORDER — FLUCONAZOLE 150 MG PO TABS: ORAL_TABLET | ORAL | 0 refills | Status: DC

## 2020-12-19 NOTE — Discharge Instructions (Addendum)
UA negative for infection.   Will culture and inform of positive results as needed  Swab testing will be back in about 2-3 days. Do not have sex until tests are back and negative or until treatment is completed, whichever is longer  Follow up with this office or with primary care if symptoms are persisting.  Follow up in the ER for high fever, trouble swallowing, trouble breathing, other concerning symptoms.

## 2020-12-19 NOTE — ED Provider Notes (Signed)
[facility]   CC: UTI  SUBJECTIVE:  Mallory Mcdowell is a 35 y.o. female who complains of urinary frequency, urgency and dysuria for the past 2 days. Reports left low back pain as well as vaginal irritation. Reports that she usually gets a yeast infection when her blood sugar is running higher. Patient denies a precipitating event, recent sexual encounter, excessive caffeine intake. Has not tried OTC medications without relief. Symptoms are made worse with urination. Admits to similar symptoms in the past. Denies fever, chills, nausea, vomiting, abdominal pain, abnormal vaginal discharge or bleeding, hematuria.    LMP: Patient's last menstrual period was 12/10/2020 (approximate).  ROS: As in HPI.  All other pertinent ROS negative.     Past Medical History:  Diagnosis Date  . Diabetes mellitus without complication ([place])   . Hypertension    History reviewed. No pertinent surgical history. Allergies  Allergen Reactions  . Amoxicillin Swelling   No current facility-administered medications on file prior to encounter.   Current Outpatient Medications on File Prior to Encounter  Medication Sig Dispense Refill  . benzonatate (TESSALON) 100 MG capsule Take 1 capsule (100 mg total) by mouth every 8 (eight) hours. 30 capsule 0  . Blood Glucose Monitoring Suppl w/Device KIT 1 each by Does not apply route in the morning, at noon, in the evening, and at bedtime. Check blood sugar 4 times daily 1 kit 0  . cetirizine (ZYRTEC ALLERGY) 10 MG tablet Take 1 tablet (10 mg total) by mouth daily. 30 tablet 0  . fluticasone (FLONASE) 50 MCG/ACT nasal spray Place 1 spray into both nostrils daily for 14 days. 16 g 0  . lisinopril-hydrochlorothiazide (ZESTORETIC) 20-12.5 MG tablet Take 1 tablet by mouth once daily 30 tablet 3  . metFORMIN (GLUCOPHAGE) 500 MG tablet TAKE 1 TABLET BY MOUTH TWICE DAILY WITH A MEAL 60 tablet 3   Social History   Socioeconomic History  . Marital status: Single     Spouse name: Not on file  . Number of children: Not on file  . Years of education: Not on file  . Highest education level: Not on file  Occupational History  . Not on file  Tobacco Use  . Smoking status: Former Smoker    Packs/day: 0.50    Types: Cigarettes  . Smokeless tobacco: Never Used  Vaping Use  . Vaping Use: Never used  Substance and Sexual Activity  . Alcohol use: Yes    Alcohol/week: 1.0 standard drink    Types: 1 Cans of beer per week    Comment: sometimes daily, but not always  . Drug use: Yes    Types: Marijuana  . Sexual activity: Yes    Birth control/protection: None  Other Topics Concern  . Not on file  Social History Narrative  . Not on file   Social Determinants of Health   Financial Resource Strain: Not on file  Food Insecurity: Food Insecurity Present  . Worried About Charity fundraiser in the Last Year: Sometimes true  . Ran Out of Food in the Last Year: Sometimes true  Transportation Needs: No Transportation Needs  . Lack of Transportation (Medical): No  . Lack of Transportation (Non-Medical): No  Physical Activity: Insufficiently Active  . Days of Exercise per Week: 3 days  . Minutes of Exercise per Session: 10 min  Stress: Stress Concern Present  . Feeling of Stress : Rather much  Social Connections: Moderately Integrated  . Frequency of Communication with Friends and Family:  More than three times a week  . Frequency of Social Gatherings with Friends and Family: More than three times a week  . Attends Religious Services: 1 to 4 times per year  . Active Member of Clubs or Organizations: Yes  . Attends Archivist Meetings: 1 to 4 times per year  . Marital Status: Never married  Intimate Partner Violence: Not At Risk  . Fear of Current or Ex-Partner: No  . Emotionally Abused: No  . Physically Abused: No  . Sexually Abused: No   Family History  Problem Relation Age of Onset  . Asthma Mother   . Diabetes Other   . Heart failure  Other   . Cancer Other     OBJECTIVE:  Vitals:   12/19/20 1129  BP: 139/77  Pulse: (!) 112  Resp: 18  Temp: 98 F (36.7 C)  SpO2: 96%   General appearance: AOx3 in no acute distress HEENT: NCAT. Oropharynx clear.  Lungs: clear to auscultation bilaterally without adventitious breath sounds Heart: regular rate and rhythm. Radial pulses 2+ symmetrical bilaterally Abdomen: soft; non-distended; no tenderness; bowel sounds present; no guarding or rebound tenderness Back: no CVA tenderness, non tender to palpation Extremities: no edema; symmetrical with no gross deformities Skin: warm and dry Neurologic: Ambulates from chair to exam table without difficulty Psychological: alert and cooperative; normal mood and affect  Labs Reviewed  POCT URINALYSIS DIP (MANUAL ENTRY) - Abnormal; Notable for the following components:      Result Value   Glucose, UA =100 (*)    Blood, UA trace-intact (*)    All other components within normal limits  POCT FASTING CBG [facility] MANUAL ENTRY - Abnormal; Notable for the following components:   POCT Glucose ([facility]) 234 (*)    All other components within normal limits  URINE CULTURE  CERVICOVAGINAL ANCILLARY ONLY    ASSESSMENT & PLAN:  1. Acute left-sided low back pain without sciatica   2. Left flank pain   3. Vaginal irritation   4. Tachycardia   5. Hyperglycemia     Meds ordered this encounter  Medications  . fluconazole (DIFLUCAN) 150 MG tablet    Sig: Take one tablet at the onset of symptoms. If symptoms are still present 3 days later, take the second tablet.    Dispense:  2 tablet    Refill:  0    Order Specific Question:   Supervising Provider    Answer:   Chase Picket [id number]  . meloxicam (MOBIC) 15 MG tablet    Sig: Take 1 tablet (15 mg total) by mouth daily.    Dispense:  30 tablet    Refill:  0    Order Specific Question:   Supervising Provider    Answer:   Chase Picket [[id number]]   Will treat back pain with  Mobic Prescribed fluconazole for likely yeast Cytology swab obtained Will be in touch with positive results that require further treatment Urine culture sent  We will call you with abnormal results that need further treatment CBG 234 in office today Push fluids and get plenty of rest Take antibiotic as directed and to completion Take pyridium as prescribed and as needed for symptomatic relief Follow up with PCP if symptoms persists Return here or go to ER if you have any new or worsening symptoms such as fever, worsening abdominal pain, nausea/vomiting, flank pain  Outlined signs and symptoms indicating need for more acute intervention Patient verbalized understanding After Visit Summary given  Faustino Congress, NP 12/19/20 1152

## 2020-12-19 NOTE — ED Triage Notes (Signed)
Pt presents with left lower flank pain that began 2 days ago , pt also has vaginal irritation. Pt has h/o frequent yeast infection

## 2020-12-20 ENCOUNTER — Other Ambulatory Visit: Payer: Self-pay

## 2020-12-20 ENCOUNTER — Emergency Department (HOSPITAL_COMMUNITY)
Admission: EM | Admit: 2020-12-20 | Discharge: 2020-12-20 | Disposition: A | Discharge status: Home / Self Care | Payer: Medicaid Other | Attending: Emergency Medicine | Admitting: Emergency Medicine

## 2020-12-20 ENCOUNTER — Encounter (HOSPITAL_COMMUNITY): Payer: Self-pay

## 2020-12-20 ENCOUNTER — Telehealth (HOSPITAL_COMMUNITY): Payer: Self-pay | Admitting: Emergency Medicine

## 2020-12-20 DIAGNOSIS — E119 Type 2 diabetes mellitus without complications: Secondary | ICD-10-CM | POA: Insufficient documentation

## 2020-12-20 DIAGNOSIS — Z87891 Personal history of nicotine dependence: Secondary | ICD-10-CM | POA: Insufficient documentation

## 2020-12-20 DIAGNOSIS — I10 Essential (primary) hypertension: Secondary | ICD-10-CM | POA: Insufficient documentation

## 2020-12-20 DIAGNOSIS — Z79899 Other long term (current) drug therapy: Secondary | ICD-10-CM | POA: Insufficient documentation

## 2020-12-20 DIAGNOSIS — Z7984 Long term (current) use of oral hypoglycemic drugs: Secondary | ICD-10-CM | POA: Insufficient documentation

## 2020-12-20 DIAGNOSIS — M5442 Lumbago with sciatica, left side: Secondary | ICD-10-CM | POA: Insufficient documentation

## 2020-12-20 LAB — CERVICOVAGINAL ANCILLARY ONLY
Bacterial Vaginitis (gardnerella): POSITIVE — AB
Candida Glabrata: NEGATIVE
Candida Vaginitis: POSITIVE — AB
Chlamydia: NEGATIVE
Comment: NEGATIVE
Comment: NEGATIVE
Comment: NEGATIVE
Comment: NEGATIVE
Comment: NEGATIVE
Comment: NORMAL
Neisseria Gonorrhea: NEGATIVE
Trichomonas: NEGATIVE

## 2020-12-20 MED ORDER — CYCLOBENZAPRINE HCL 10 MG PO TABS
10.0000 mg | ORAL_TABLET | Freq: Once | ORAL | Status: AC
  Administered 2020-12-20: 10 mg via ORAL
  Filled 2020-12-20: qty 1

## 2020-12-20 MED ORDER — CYCLOBENZAPRINE HCL 10 MG PO TABS: 10.0000 mg | ORAL_TABLET | Freq: Three times a day (TID) | ORAL | 0 refills | Status: DC | PRN

## 2020-12-20 MED ORDER — METRONIDAZOLE 500 MG PO TABS: 500.0000 mg | ORAL_TABLET | Freq: Two times a day (BID) | ORAL | 0 refills | Status: DC

## 2020-12-20 NOTE — ED Triage Notes (Signed)
Pt c/o lower left back pain that started two days ago. Pt was seen at urgent care,  Rx Mobic for pain, pt says she took the medication, went to sleep about 4 hours later, then woke up and c/o the back pain is still present and is now "jumping to leg".

## 2020-12-20 NOTE — Discharge Instructions (Signed)
Apply ice for 30 minutes at a time, 4 times a day.  Continue taking meloxicam.  You may take acetaminophen as needed for additional pain relief.  You may also add cyclobenzaprine as needed for muscle spasms.

## 2020-12-20 NOTE — ED Provider Notes (Signed)
[facility] EMERGENCY DEPARTMENT Provider Note   CSN: [id number] Arrival date & time: 12/20/20  0255   History Chief Complaint  Patient presents with  . Back Pain    Mallory Mcdowell is a 35 y.o. female.  The history is provided by the patient.  Back Pain She complains of left-sided low back pain for the last 2-3 days.  She went to urgent care center where she was given a prescription for cam.  She was also given prescription for fluconazole for possible yeast vaginitis.  Tonight, she noted the pain was going into her left leg and she became concerned.  She states she is especially concerned to make sure that it does not anything involving her liver or kidneys.  She denies weakness, numbness, tingling.  She denies bowel or bladder dysfunction.  She does have a job which involves moderate amount of strain on her lower back.  She has had back problems in the past.  Past Medical History:  Diagnosis Date  . Diabetes mellitus without complication ([place])   . Hypertension     Patient Active Problem List   Diagnosis Date Noted  . Encounter for physical examination, contraception, and Papanicolaou smear 12/24/2016  . Family planning 12/24/2016  . Cigarette nicotine dependence without complication 76/72/0947  . Uncontrolled diabetes mellitus type 2 without complications 09/62/8366  . Elevated platelet count 05/01/2016  . Alcohol abuse 05/01/2016    History reviewed. No pertinent surgical history.   OB History    Gravida  0   Para  0   Term  0   Preterm  0   AB  0   Living  0     SAB  0   IAB  0   Ectopic  0   Multiple  0   Live Births              Family History  Problem Relation Age of Onset  . Asthma Mother   . Diabetes Other   . Heart failure Other   . Cancer Other     Social History   Tobacco Use  . Smoking status: Former Smoker    Packs/day: 0.50    Types: Cigarettes  . Smokeless tobacco: Never Used  Vaping Use  . Vaping Use: Never used   Substance Use Topics  . Alcohol use: Yes    Alcohol/week: 1.0 standard drink    Types: 1 Cans of beer per week    Comment: sometimes daily, but not always  . Drug use: Yes    Types: Marijuana    Home Medications Prior to Admission medications   Medication Sig Start Date End Date Taking? Authorizing Provider  benzonatate (TESSALON) 100 MG capsule Take 1 capsule (100 mg total) by mouth every 8 (eight) hours. 09/10/20   Avegno, Darrelyn Hillock, FNP  Blood Glucose Monitoring Suppl w/Device KIT 1 each by Does not apply route in the morning, at noon, in the evening, and at bedtime. Check blood sugar 4 times daily 09/03/20   Florian Buff, MD  cetirizine (ZYRTEC ALLERGY) 10 MG tablet Take 1 tablet (10 mg total) by mouth daily. 09/10/20   Avegno, Darrelyn Hillock, FNP  fluconazole (DIFLUCAN) 150 MG tablet Take one tablet at the onset of symptoms. If symptoms are still present 3 days later, take the second tablet. 12/19/20   Faustino Congress, NP  fluticasone (FLONASE) 50 MCG/ACT nasal spray Place 1 spray into both nostrils daily for 14 days. 09/10/20 09/24/20  Pilar Grammes  S, FNP  lisinopril-hydrochlorothiazide (ZESTORETIC) 20-12.5 MG tablet Take 1 tablet by mouth once daily 11/15/20   Florian Buff, MD  meloxicam (MOBIC) 15 MG tablet Take 1 tablet (15 mg total) by mouth daily. 12/19/20   Faustino Congress, NP  metFORMIN (GLUCOPHAGE) 500 MG tablet TAKE 1 TABLET BY MOUTH TWICE DAILY WITH A MEAL 11/28/20   Florian Buff, MD    Allergies    Amoxicillin  Review of Systems   Review of Systems  Musculoskeletal: Positive for back pain.  All other systems reviewed and are negative.   Physical Exam Updated Vital Signs BP (!) 147/98 (BP Location: Right Arm)   Pulse 94   Temp 98.2 F (36.8 C) (Oral)   Resp 18   Ht '5\' 5"'  (1.651 m)   Wt 83.5 kg   LMP 12/10/2020 (Approximate)   SpO2 100%   BMI 30.63 kg/m   Physical Exam Vitals and nursing note reviewed.   35 year old female, resting comfortably  and in no acute distress. Vital signs are significant for elevated blood pressure. Oxygen saturation is 100%, which is normal. Head is normocephalic and atraumatic. PERRLA, EOMI. Oropharynx is clear. Neck is nontender and supple without adenopathy or JVD. Back is nontender in the midline.  There is mild spasm of the left paralumbar muscles, and some mild tenderness in the region of the left posterior iliac crest.  Straight leg raise is negative.  There is no CVA tenderness. Lungs are clear without rales, wheezes, or rhonchi. Chest is nontender. Heart has regular rate and rhythm without murmur. Abdomen is soft, flat, nontender without masses or hepatosplenomegaly and peristalsis is normoactive. Extremities have no cyanosis or edema, full range of motion is present. Skin is warm and dry without rash. Neurologic: Mental status is normal, cranial nerves are intact, there are no motor or sensory deficits.  ED Results / Procedures / Treatments    Procedures Procedures   Medications Ordered in ED Medications  cyclobenzaprine (FLEXERIL) tablet 10 mg (has no administration in time range)    ED Course  I have reviewed the triage vital signs and the nursing notes.  MDM Rules/Calculators/A&P Low back pain which appears musculoskeletal.  No red flags to suggest more serious pathology.  Old records are reviewed confirming urgent care visit yesterday with prescription given for meloxicam.  She also has had lumbar spine x-rays in 2009 which were unremarkable.  No indication for imaging today.  Urine at urgent care was negative, no need to repeat.  We will add cyclobenzaprine to her regimen and recommended that she use ice as needed.  Return precautions discussed.  Final Clinical Impression(s) / ED Diagnoses Final diagnoses:  Acute left-sided low back pain with left-sided sciatica  Elevated blood pressure reading with diagnosis of hypertension    Rx / DC Orders ED Discharge Orders         Ordered     cyclobenzaprine (FLEXERIL) 10 MG tablet  3 times daily PRN        12/20/20 5883           Delora Fuel, MD 25/49/82 [phone]

## 2020-12-21 LAB — URINE CULTURE: Culture: NO GROWTH

## 2020-12-23 ENCOUNTER — Telehealth: Payer: Self-pay | Admitting: Nutrition

## 2020-12-23 ENCOUNTER — Encounter: Payer: Medicaid Other | Attending: Obstetrics & Gynecology | Admitting: Nutrition

## 2020-12-23 NOTE — Telephone Encounter (Signed)
TC to patient. She didn't get on mychart for her visit. Left message in VM for her to call or get on mychart for her visit.

## 2021-03-03 ENCOUNTER — Encounter: Payer: Self-pay | Admitting: Emergency Medicine

## 2021-03-03 ENCOUNTER — Emergency Department (HOSPITAL_COMMUNITY)
Admission: EM | Admit: 2021-03-03 | Discharge: 2021-03-03 | Disposition: A | Discharge status: Home / Self Care | Payer: Medicaid Other | Attending: Emergency Medicine | Admitting: Emergency Medicine

## 2021-03-03 ENCOUNTER — Ambulatory Visit
Admission: EM | Admit: 2021-03-03 | Discharge: 2021-03-03 | Disposition: A | Discharge status: Home / Self Care | Payer: Medicaid Other | Attending: Family Medicine | Admitting: Family Medicine

## 2021-03-03 ENCOUNTER — Other Ambulatory Visit: Payer: Self-pay

## 2021-03-03 DIAGNOSIS — I10 Essential (primary) hypertension: Secondary | ICD-10-CM

## 2021-03-03 DIAGNOSIS — M7989 Other specified soft tissue disorders: Secondary | ICD-10-CM

## 2021-03-03 DIAGNOSIS — Z7984 Long term (current) use of oral hypoglycemic drugs: Secondary | ICD-10-CM

## 2021-03-03 DIAGNOSIS — R739 Hyperglycemia, unspecified: Secondary | ICD-10-CM

## 2021-03-03 DIAGNOSIS — E1165 Type 2 diabetes mellitus with hyperglycemia: Secondary | ICD-10-CM

## 2021-03-03 DIAGNOSIS — Z5321 Procedure and treatment not carried out due to patient leaving prior to being seen by health care provider: Secondary | ICD-10-CM | POA: Insufficient documentation

## 2021-03-03 LAB — POCT URINALYSIS DIP (MANUAL ENTRY)
Bilirubin, UA: NEGATIVE
Glucose, UA: NEGATIVE mg/dL
Leukocytes, UA: NEGATIVE
Nitrite, UA: NEGATIVE
Protein Ur, POC: NEGATIVE mg/dL
Spec Grav, UA: >=1.03 — AB (ref 1.010–1.025)
Urobilinogen, UA: 0.2 E.U./dL
pH, UA: 5.5 (ref 5.0–8.0)

## 2021-03-03 LAB — POCT FASTING CBG KUC MANUAL ENTRY: POCT Glucose (KUC): 184 mg/dL — AB (ref 70–99)

## 2021-03-03 MED ORDER — LISINOPRIL-HYDROCHLOROTHIAZIDE 20-12.5 MG PO TABS
1.0000 | ORAL_TABLET | Freq: Every day | ORAL | 0 refills | Status: DC
Start: 2021-03-03 — End: 2021-07-12

## 2021-03-03 MED ORDER — METFORMIN HCL 500 MG PO TABS
1.0000 | ORAL_TABLET | Freq: Two times a day (BID) | ORAL | 0 refills | Status: DC
Start: 2021-03-03 — End: 2021-05-21

## 2021-03-03 NOTE — ED Triage Notes (Signed)
Pt reports bilateral leg swelling x 35 minutes. Pt out of her lisinopril x 3 days and will pick up her prescription tomorrow. Denies shortness of breath, headache.

## 2021-03-03 NOTE — ED Notes (Signed)
Pt reports she is here on her break from work and cannot wait to see provider. Pt reports she would like to leave and will return after she gets off work.

## 2021-03-03 NOTE — ED Triage Notes (Signed)
Bilateral ankle swelling that started last night

## 2021-03-03 NOTE — ED Provider Notes (Signed)
[facility]    CSN: [id number] Arrival date & time: 03/03/21  0810      History   Chief Complaint No chief complaint on file.   HPI Mallory Mcdowell is a 35 y.o. female.   HPI  Patient presents today with bilateral recurrent leg swelling. Patient has history of hypertension and diabetes.  Patient is prescribed lisinopril HCTZ however has not taken her medicine in the last several days.  Patient reports she was unaware that her blood pressure medication containing a diuretic which helped reduce her blood pressure as well as leg swelling. Denies SOB, wheezing, or chest pain.  Past Medical History:  Diagnosis Date  . Diabetes mellitus without complication ([place])   . Hypertension     Patient Active Problem List   Diagnosis Date Noted  . Encounter for physical examination, contraception, and Papanicolaou smear 12/24/2016  . Family planning 12/24/2016  . Cigarette nicotine dependence without complication 32/44/0102  . Uncontrolled diabetes mellitus type 2 without complications 72/53/6644  . Elevated platelet count 05/01/2016  . Alcohol abuse 05/01/2016    History reviewed. No pertinent surgical history.  OB History    Gravida  0   Para  0   Term  0   Preterm  0   AB  0   Living  0     SAB  0   IAB  0   Ectopic  0   Multiple  0   Live Births               Home Medications    Prior to Admission medications   Medication Sig Start Date End Date Taking? Authorizing Provider  Blood Glucose Monitoring Suppl w/Device KIT 1 each by Does not apply route in the morning, at noon, in the evening, and at bedtime. Check blood sugar 4 times daily 09/03/20   Florian Buff, MD  cetirizine (ZYRTEC ALLERGY) 10 MG tablet Take 1 tablet (10 mg total) by mouth daily. 09/10/20   Avegno, Darrelyn Hillock, FNP  cyclobenzaprine (FLEXERIL) 10 MG tablet Take 1 tablet (10 mg total) by mouth 3 (three) times daily as needed for muscle spasms. 0/34/74   Delora Fuel, MD   fluconazole (DIFLUCAN) 150 MG tablet Take one tablet at the onset of symptoms. If symptoms are still present 3 days later, take the second tablet. 12/19/20   Faustino Congress, NP  fluticasone (FLONASE) 50 MCG/ACT nasal spray Place 1 spray into both nostrils daily for 14 days. 09/10/20 09/24/20  Emerson Monte, FNP  lisinopril-hydrochlorothiazide (ZESTORETIC) 20-12.5 MG tablet Take 1 tablet by mouth once daily 11/15/20   Florian Buff, MD  meloxicam (MOBIC) 15 MG tablet Take 1 tablet (15 mg total) by mouth daily. 12/19/20   Faustino Congress, NP  metFORMIN (GLUCOPHAGE) 500 MG tablet TAKE 1 TABLET BY MOUTH TWICE DAILY WITH A MEAL 11/28/20   Florian Buff, MD  metroNIDAZOLE (FLAGYL) 500 MG tablet Take 1 tablet (500 mg total) by mouth 2 (two) times daily. 12/20/20   LampteyMyrene Galas, MD    Family History Family History  Problem Relation Age of Onset  . Asthma Mother   . Diabetes Other   . Heart failure Other   . Cancer Other     Social History Social History   Tobacco Use  . Smoking status: Former Smoker    Packs/day: 0.50    Types: Cigarettes  . Smokeless tobacco: Never Used  Vaping Use  . Vaping Use: Never used  Substance Use Topics  . Alcohol use: Yes    Alcohol/week: 1.0 standard drink    Types: 1 Cans of beer per week    Comment: sometimes daily, but not always  . Drug use: Yes    Types: Marijuana     Allergies   Amoxicillin   Review of Systems Review of Systems Pertinent negatives listed in HPI   Physical Exam Triage Vital Signs ED Triage Vitals  Enc Vitals Group     BP 03/03/21 0843 (!) 156/91     Pulse Rate 03/03/21 0843 86     Resp 03/03/21 0843 16     Temp 03/03/21 0843 98.7 F (37.1 C)     Temp Source 03/03/21 0843 Tympanic     SpO2 03/03/21 0843 97 %     Weight --      Height --      Head Circumference --      Peak Flow --      Pain Score 03/03/21 0846 0     Pain Loc --      Pain Edu? --      Excl. in [place]? --    No data found.  Updated  Vital Signs BP (!) 156/91 (BP Location: Right Arm) Comment: has not had bp meds in 3days  Pulse 86   Temp 98.7 F (37.1 C) (Tympanic)   Resp 16   SpO2 97%   Visual Acuity Right Eye Distance:   Left Eye Distance:   Bilateral Distance:    Right Eye Near:   Left Eye Near:    Bilateral Near:     Physical Exam General appearance: Alert,  cooperative , no distress  Head: Normocephalic, without obvious abnormality, atraumatic Respiratory: Respirations even and unlabored, normal respiratory rate Heart: Rate and rhythm normal. No gallop or murmurs noted on exam  Extremities: +3 bilateral LLE non-pitting edema  Skin: Skin color, texture, turgor normal. No rashes seen  Psych: Appropriate mood and affect. Neurologic: GCS 15, normal gait, normal coordination  UC Treatments / Results  Labs (all labs ordered are listed, but only abnormal results are displayed) Labs Reviewed  POCT URINALYSIS DIP (MANUAL ENTRY) - Abnormal; Notable for the following components:      Result Value   Ketones, POC UA trace (5) (*)    Spec Grav, UA >=1.030 (*)    Blood, UA large (*)    All other components within normal limits  POCT FASTING CBG [facility] MANUAL ENTRY - Abnormal; Notable for the following components:   POCT Glucose ([facility]) 184 (*)    All other components within normal limits    EKG   Radiology No results found.  Procedures Procedures (including critical care time)  Medications Ordered in UC Medications - No data to display  Initial Impression / Assessment and Plan / UC Course  I have reviewed the triage vital signs and the nursing notes.  Pertinent labs & imaging results that were available during my care of the patient were reviewed by me and considered in my medical decision making (see chart for details).    Hyperglycemia, follow up with PCP for ongoing management Hypertension and leg swelling resume lisinopril HCTZ, limit sodium. PCP follow-up for ongoing management of chronic  condition. Final Clinical Impressions(s) / UC Diagnoses   Final diagnoses:  Hyperglycemia  Essential hypertension  Leg swelling   Discharge Instructions   None    ED Prescriptions    Medication Sig Dispense Auth. Provider   lisinopril-hydrochlorothiazide (ZESTORETIC) 20-12.5 MG  tablet Take 1 tablet by mouth daily. 90 tablet Scot Jun, FNP   metFORMIN (GLUCOPHAGE) 500 MG tablet Take 1 tablet (500 mg total) by mouth 2 (two) times daily with a meal. 60 tablet Scot Jun, FNP     PDMP not reviewed this encounter.   Scot Jun, [place] 03/06/21 [phone]

## 2021-03-19 ENCOUNTER — Ambulatory Visit
Admission: EM | Admit: 2021-03-19 | Discharge: 2021-03-19 | Disposition: A | Discharge status: Home / Self Care | Payer: Medicaid Other | Attending: Family Medicine | Admitting: Family Medicine

## 2021-03-19 ENCOUNTER — Encounter: Payer: Self-pay | Admitting: Emergency Medicine

## 2021-03-19 ENCOUNTER — Other Ambulatory Visit: Payer: Self-pay

## 2021-03-19 DIAGNOSIS — N898 Other specified noninflammatory disorders of vagina: Secondary | ICD-10-CM

## 2021-03-19 MED ORDER — FLUCONAZOLE 150 MG PO TABS: ORAL_TABLET | ORAL | 0 refills | Status: DC

## 2021-03-19 NOTE — ED Triage Notes (Signed)
States she thinks she has a yeast infection, states she is having white discharged since over the weekend.

## 2021-03-19 NOTE — Discharge Instructions (Addendum)
I have sent in fluconazole in case of yeast. Take one tablet at the onset of symptoms. If symptoms are still present in 3 days, take the second tablet.   Swab results will be back in about 2 days. We will be in contact with any abnormal results that require further treatment  Follow up with this office or with primary care if symptoms are persisting.  Follow up in the ER for high fever, trouble swallowing, trouble breathing, other concerning symptoms.

## 2021-03-19 NOTE — ED Provider Notes (Signed)
[facility]    CSN: [id number] Arrival date & time: 03/19/21  1523      History   Chief Complaint No chief complaint on file.   HPI Mallory Mcdowell is a 35 y.o. female.   Reports that she is having white vaginal discharge for the last couple of days.  States she thinks that she has a yeast infection.  States that she gets them when her blood sugar gets high, states that her blood sugars have been running a little bit high at home.  Cannot recall exact number.  Has not attempted OTC treatment.  Also has history of BV.  Denies vaginal odor, itching, irritation, burning.  ROS per HPI  The history is provided by the patient.    Past Medical History:  Diagnosis Date  . Diabetes mellitus without complication ([place])   . Hypertension     Patient Active Problem List   Diagnosis Date Noted  . Encounter for physical examination, contraception, and Papanicolaou smear 12/24/2016  . Family planning 12/24/2016  . Cigarette nicotine dependence without complication 70/62/3762  . Uncontrolled diabetes mellitus type 2 without complications 83/15/1761  . Elevated platelet count 05/01/2016  . Alcohol abuse 05/01/2016    History reviewed. No pertinent surgical history.  OB History    Gravida  0   Para  0   Term  0   Preterm  0   AB  0   Living  0     SAB  0   IAB  0   Ectopic  0   Multiple  0   Live Births               Home Medications    Prior to Admission medications   Medication Sig Start Date End Date Taking? Authorizing Provider  fluconazole (DIFLUCAN) 150 MG tablet Take one tablet at the onset of symptoms. If symptoms are still present 3 days later, take the second tablet. 03/19/21  Yes Faustino Congress, NP  Blood Glucose Monitoring Suppl w/Device KIT 1 each by Does not apply route in the morning, at noon, in the evening, and at bedtime. Check blood sugar 4 times daily 09/03/20   Florian Buff, MD  cetirizine (ZYRTEC ALLERGY) 10 MG tablet  Take 1 tablet (10 mg total) by mouth daily. 09/10/20   Avegno, Darrelyn Hillock, FNP  cyclobenzaprine (FLEXERIL) 10 MG tablet Take 1 tablet (10 mg total) by mouth 3 (three) times daily as needed for muscle spasms. 04/15/36   Delora Fuel, MD  fluticasone [facility]) 50 MCG/ACT nasal spray Place 1 spray into both nostrils daily for 14 days. 09/10/20 09/24/20  Avegno, Darrelyn Hillock, FNP  lisinopril-hydrochlorothiazide (ZESTORETIC) 20-12.5 MG tablet Take 1 tablet by mouth daily. 03/03/21   Scot Jun, FNP  meloxicam (MOBIC) 15 MG tablet Take 1 tablet (15 mg total) by mouth daily. 12/19/20   Faustino Congress, NP  metFORMIN (GLUCOPHAGE) 500 MG tablet Take 1 tablet (500 mg total) by mouth 2 (two) times daily with a meal. 03/03/21   Scot Jun, FNP    Family History Family History  Problem Relation Age of Onset  . Asthma Mother   . Diabetes Other   . Heart failure Other   . Cancer Other     Social History Social History   Tobacco Use  . Smoking status: Former Smoker    Packs/day: 0.50    Types: Cigarettes  . Smokeless tobacco: Never Used  Vaping Use  . Vaping Use: Never  used  Substance Use Topics  . Alcohol use: Yes    Alcohol/week: 1.0 standard drink    Types: 1 Cans of beer per week    Comment: sometimes daily, but not always  . Drug use: Yes    Types: Marijuana     Allergies   Amoxicillin   Review of Systems Review of Systems   Physical Exam Triage Vital Signs ED Triage Vitals  Enc Vitals Group     BP 03/19/21 1536 122/67     Pulse Rate 03/19/21 1536 (!) 110     Resp 03/19/21 1536 18     Temp 03/19/21 1536 98.6 F (37 C)     Temp Source 03/19/21 1536 Oral     SpO2 03/19/21 1536 96 %     Weight --      Height --      Head Circumference --      Peak Flow --      Pain Score 03/19/21 1537 0     Pain Loc --      Pain Edu? --      Excl. in [place]? --    No data found.  Updated Vital Signs BP 122/67 (BP Location: Right Arm)   Pulse (!) 110   Temp 98.6 F (37  C) (Oral)   Resp 18   LMP 03/07/2021   SpO2 96%   Visual Acuity Right Eye Distance:   Left Eye Distance:   Bilateral Distance:    Right Eye Near:   Left Eye Near:    Bilateral Near:     Physical Exam Vitals and nursing note reviewed.  Constitutional:      General: She is not in acute distress.    Appearance: Normal appearance. She is well-developed. She is not ill-appearing.  HENT:     Head: Normocephalic and atraumatic.  Eyes:     Extraocular Movements: Extraocular movements intact.     Conjunctiva/sclera: Conjunctivae normal.     Pupils: Pupils are equal, round, and reactive to light.  Cardiovascular:     Rate and Rhythm: Normal rate.  Pulmonary:     Effort: Pulmonary effort is normal.  Abdominal:     General: There is no distension.     Palpations: Abdomen is soft. There is no mass.     Tenderness: There is no abdominal tenderness. There is no right CVA tenderness, left CVA tenderness, guarding or rebound.     Hernia: No hernia is present.  Musculoskeletal:        General: Normal range of motion.     Cervical back: Normal range of motion.  Skin:    General: Skin is warm and dry.     Capillary Refill: Capillary refill takes less than 2 seconds.  Neurological:     General: No focal deficit present.     Mental Status: She is alert and oriented to person, place, and time.  Psychiatric:        Mood and Affect: Mood normal.        Behavior: Behavior normal.        Thought Content: Thought content normal.      UC Treatments / Results  Labs (all labs ordered are listed, but only abnormal results are displayed) Labs Reviewed  CERVICOVAGINAL ANCILLARY ONLY    EKG   Radiology No results found.  Procedures Procedures (including critical care time)  Medications Ordered in UC Medications - No data to display  Initial Impression / Assessment and Plan / UC Course  I have reviewed the triage vital signs and the nursing notes.  Pertinent labs & imaging  results that were available during my care of the patient were reviewed by me and considered in my medical decision making (see chart for details).    Vaginal discharge  Prescribed fluconazole as patient has history of yeast infections, she has been experiencing white discharge and higher blood sugars Cytologyself swab obtained Will inform of abnormal labs as a result and will treat accordingly Follow-up with primary care with this office as needed  Final Clinical Impressions(s) / UC Diagnoses   Final diagnoses:  Vaginal discharge     Discharge Instructions     I have sent in fluconazole in case of yeast. Take one tablet at the onset of symptoms. If symptoms are still present in 3 days, take the second tablet.   Swab results will be back in about 2 days. We will be in contact with any abnormal results that require further treatment  Follow up with this office or with primary care if symptoms are persisting.  Follow up in the ER for high fever, trouble swallowing, trouble breathing, other concerning symptoms.     ED Prescriptions    Medication Sig Dispense Auth. Provider   fluconazole (DIFLUCAN) 150 MG tablet Take one tablet at the onset of symptoms. If symptoms are still present 3 days later, take the second tablet. 2 tablet Faustino Congress, NP     PDMP not reviewed this encounter.   Faustino Congress, NP 03/19/21 1555

## 2021-03-20 LAB — CERVICOVAGINAL ANCILLARY ONLY
Bacterial Vaginitis (gardnerella): POSITIVE — AB
Candida Glabrata: NEGATIVE
Candida Vaginitis: POSITIVE — AB
Chlamydia: NEGATIVE
Comment: NEGATIVE
Comment: NEGATIVE
Comment: NEGATIVE
Comment: NEGATIVE
Comment: NEGATIVE
Comment: NORMAL
Neisseria Gonorrhea: NEGATIVE
Trichomonas: NEGATIVE

## 2021-03-21 ENCOUNTER — Telehealth (HOSPITAL_COMMUNITY): Payer: Self-pay | Admitting: Emergency Medicine

## 2021-03-21 MED ORDER — METRONIDAZOLE 500 MG PO TABS: 500.0000 mg | ORAL_TABLET | Freq: Two times a day (BID) | ORAL | 0 refills | Status: DC

## 2021-05-05 ENCOUNTER — Other Ambulatory Visit: Payer: Self-pay | Admitting: Obstetrics & Gynecology

## 2021-05-08 ENCOUNTER — Other Ambulatory Visit: Payer: Self-pay | Admitting: Family Medicine

## 2021-05-14 ENCOUNTER — Ambulatory Visit: Payer: Medicaid Other

## 2021-05-15 ENCOUNTER — Other Ambulatory Visit: Payer: Self-pay

## 2021-05-20 ENCOUNTER — Other Ambulatory Visit: Payer: Self-pay | Admitting: Family Medicine

## 2021-06-02 ENCOUNTER — Ambulatory Visit
Admission: RE | Admit: 2021-06-02 | Discharge: 2021-06-02 | Disposition: A | Discharge status: Home / Self Care | Payer: Medicaid Other | Source: Ambulatory Visit | Attending: Family Medicine | Admitting: Family Medicine

## 2021-06-02 VITALS — BP 128/80 | HR 107 | Temp 97.9°F | Resp 16

## 2021-06-02 DIAGNOSIS — E1165 Type 2 diabetes mellitus with hyperglycemia: Secondary | ICD-10-CM

## 2021-06-02 DIAGNOSIS — N898 Other specified noninflammatory disorders of vagina: Secondary | ICD-10-CM | POA: Insufficient documentation

## 2021-06-02 DIAGNOSIS — N760 Acute vaginitis: Secondary | ICD-10-CM

## 2021-06-02 LAB — POCT URINALYSIS DIP (MANUAL ENTRY)
Bilirubin, UA: NEGATIVE
Blood, UA: NEGATIVE
Glucose, UA: 500 mg/dL — AB
Leukocytes, UA: NEGATIVE
Nitrite, UA: NEGATIVE
Protein Ur, POC: NEGATIVE mg/dL
Spec Grav, UA: >=1.03 — AB (ref 1.010–1.025)
Urobilinogen, UA: 0.2 E.U./dL
pH, UA: 5.5 (ref 5.0–8.0)

## 2021-06-02 MED ORDER — METRONIDAZOLE 500 MG PO TABS: 500.0000 mg | ORAL_TABLET | Freq: Two times a day (BID) | ORAL | 0 refills | Status: DC

## 2021-06-02 MED ORDER — FLUCONAZOLE 150 MG PO TABS: ORAL_TABLET | ORAL | 0 refills | Status: DC

## 2021-06-02 NOTE — ED Triage Notes (Signed)
Thinks she has another vaginal infection.  States she has white discharge x 3 days.

## 2021-06-02 NOTE — ED Provider Notes (Addendum)
[facility]    CSN: [id number] Arrival date & time: 06/02/21  1747      History   Chief Complaint No chief complaint on file.   HPI Mallory Mcdowell is a 35 y.o. female.   HPI Patient presents today with vaginal irritation and urinary frequency.Patient still unable to secure an appointment with primary care for management of diabetes. She was able to receive a refill of metformin however reports sugars remain high. She is inactive of exercise and endorses a diet high in carbs. Current symptoms include vagina itching and irritation with burning and discomfort with urinating.  Past Medical History:  Diagnosis Date   Diabetes mellitus without complication ([place])    Hypertension     Patient Active Problem List   Diagnosis Date Noted   Encounter for physical examination, contraception, and Papanicolaou smear 12/24/2016   Family planning 12/24/2016   Cigarette nicotine dependence without complication 36/64/4034   Uncontrolled diabetes mellitus type 2 without complications 74/25/9563   Elevated platelet count 05/01/2016   Alcohol abuse 05/01/2016    History reviewed. No pertinent surgical history.  OB History     Gravida  0   Para  0   Term  0   Preterm  0   AB  0   Living  0      SAB  0   IAB  0   Ectopic  0   Multiple  0   Live Births               Home Medications    Prior to Admission medications   Medication Sig Start Date End Date Taking? Authorizing Provider  Blood Glucose Monitoring Suppl w/Device KIT 1 each by Does not apply route in the morning, at noon, in the evening, and at bedtime. Check blood sugar 4 times daily 09/03/20   Florian Buff, MD  cetirizine (ZYRTEC ALLERGY) 10 MG tablet Take 1 tablet (10 mg total) by mouth daily. 09/10/20   Avegno, Darrelyn Hillock, FNP  cyclobenzaprine (FLEXERIL) 10 MG tablet Take 1 tablet (10 mg total) by mouth 3 (three) times daily as needed for muscle spasms. 8/75/64   Delora Fuel, MD   fluconazole (DIFLUCAN) 150 MG tablet Take one tablet at the onset of symptoms. If symptoms are still present 3 days later, take the second tablet. 03/19/21   Faustino Congress, NP  fluticasone (FLONASE) 50 MCG/ACT nasal spray Place 1 spray into both nostrils daily for 14 days. 09/10/20 09/24/20  Avegno, Darrelyn Hillock, FNP  lisinopril-hydrochlorothiazide (ZESTORETIC) 20-12.5 MG tablet Take 1 tablet by mouth daily. 03/03/21   Scot Jun, FNP  meloxicam (MOBIC) 15 MG tablet Take 1 tablet (15 mg total) by mouth daily. 12/19/20   Faustino Congress, NP  metFORMIN (GLUCOPHAGE) 500 MG tablet TAKE 1 TABLET BY MOUTH TWICE DAILY WITH A MEAL 05/21/21   Florian Buff, MD  metroNIDAZOLE (FLAGYL) 500 MG tablet Take 1 tablet (500 mg total) by mouth 2 (two) times daily. 03/21/21   Lamptey, Myrene Galas, MD    Family History Family History  Problem Relation Age of Onset   Asthma Mother    Diabetes Other    Heart failure Other    Cancer Other     Social History Social History   Tobacco Use   Smoking status: Former    Packs/day: 0.50    Types: Cigarettes   Smokeless tobacco: Never  Vaping Use   Vaping Use: Never used  Substance Use Topics  Alcohol use: Yes    Alcohol/week: 1.0 standard drink    Types: 1 Cans of beer per week    Comment: sometimes daily, but not always   Drug use: Yes    Types: Marijuana     Allergies   Amoxicillin   Review of Systems Review of Systems Pertinent negatives listed in HPI   Physical Exam Triage Vital Signs ED Triage Vitals [06/02/21 1753]  Enc Vitals Group     BP 128/80     Pulse Rate (!) 107     Resp 16     Temp 97.9 F (36.6 C)     Temp Source Oral     SpO2 97 %     Weight      Height      Head Circumference      Peak Flow      Pain Score 0     Pain Loc      Pain Edu?      Excl. in [place]?    No data found.  Updated Vital Signs BP 128/80 (BP Location: Right Arm)   Pulse (!) 107   Temp 97.9 F (36.6 C) (Oral)   Resp 16   LMP  05/28/2021 (Exact Date)   SpO2 97%   Visual Acuity Right Eye Distance:   Left Eye Distance:   Bilateral Distance:    Right Eye Near:   Left Eye Near:    Bilateral Near:     Physical Exam General appearance: alert, well developed, well nourished, cooperative  Head: Normocephalic, without obvious abnormality, atraumatic Respiratory: Respirations even and unlabored, normal respiratory rate Heart: Rate and rhythm normal. No gallop or murmurs noted on exam  Extremities: No gross deformities Skin: Skin color, texture, turgor normal. No rashes seen  Psych: Appropriate mood and affect. Neurologic: GCS 15, normal coordination, normal gait UC Treatments / Results  Labs (all labs ordered are listed, but only abnormal results are displayed) Labs Reviewed  POCT URINALYSIS DIP (MANUAL ENTRY) - Abnormal; Notable for the following components:      Result Value   Color, UA light yellow (*)    Glucose, UA =500 (*)    Ketones, POC UA trace (5) (*)    Spec Grav, UA >=1.030 (*)    All other components within normal limits  CERVICOVAGINAL ANCILLARY ONLY    EKG   Radiology No results found.  Procedures Procedures (including critical care time)  Medications Ordered in UC Medications - No data to display  Initial Impression / Assessment and Plan / UC Course  I have reviewed the triage vital signs and the nursing notes.  Pertinent labs & imaging results that were available during my care of the patient were reviewed by me and considered in my medical decision making (see chart for details).    Treating for vaginitis, recurrent BV and yeast per review of prior cytology.  UA unremarkable, urine culture pending. Patient assisted with securing a PCP one week from today for ongoing management of diabetes and completed CPE.  Patient advised to contact the office if unable to make scheduled appointment. Final Clinical Impressions(s) / UC Diagnoses   Final diagnoses:  Vaginitis and  vulvovaginitis  Vaginal discharge  Type 2 diabetes mellitus with hyperglycemia, without long-term current use of insulin [facility])   Discharge Instructions   None    ED Prescriptions     Medication Sig Dispense Auth. Provider   fluconazole (DIFLUCAN) 150 MG tablet Take one tablet at the onset of symptoms.  If symptoms are still present 3 days later, take the second tablet. 2 tablet Scot Jun, FNP   metroNIDAZOLE (FLAGYL) 500 MG tablet Take 1 tablet (500 mg total) by mouth 2 (two) times daily. 14 tablet Scot Jun, FNP      PDMP not reviewed this encounter.   Scot Jun, FNP 06/04/21 0003    Scot Jun, FNP 06/04/21 [phone]

## 2021-06-04 LAB — CERVICOVAGINAL ANCILLARY ONLY
Bacterial Vaginitis (gardnerella): POSITIVE — AB
Candida Glabrata: NEGATIVE
Candida Vaginitis: NEGATIVE
Chlamydia: NEGATIVE
Comment: NEGATIVE
Comment: NEGATIVE
Comment: NEGATIVE
Comment: NEGATIVE
Comment: NEGATIVE
Comment: NORMAL
Neisseria Gonorrhea: NEGATIVE
Trichomonas: NEGATIVE

## 2021-06-10 ENCOUNTER — Ambulatory Visit: Payer: Self-pay | Admitting: Nurse Practitioner

## 2021-07-05 ENCOUNTER — Other Ambulatory Visit: Payer: Self-pay | Admitting: Family Medicine

## 2021-07-12 ENCOUNTER — Other Ambulatory Visit: Payer: Self-pay

## 2021-07-12 ENCOUNTER — Ambulatory Visit
Admission: RE | Admit: 2021-07-12 | Discharge: 2021-07-12 | Disposition: A | Discharge status: Home / Self Care | Payer: Medicaid Other | Source: Ambulatory Visit | Attending: Internal Medicine | Admitting: Internal Medicine

## 2021-07-12 VITALS — BP 138/92 | HR 100 | Temp 98.6°F | Resp 16

## 2021-07-12 DIAGNOSIS — Z79899 Other long term (current) drug therapy: Secondary | ICD-10-CM

## 2021-07-12 DIAGNOSIS — Z760 Encounter for issue of repeat prescription: Secondary | ICD-10-CM

## 2021-07-12 DIAGNOSIS — Z7984 Long term (current) use of oral hypoglycemic drugs: Secondary | ICD-10-CM

## 2021-07-12 DIAGNOSIS — E119 Type 2 diabetes mellitus without complications: Secondary | ICD-10-CM

## 2021-07-12 DIAGNOSIS — E1165 Type 2 diabetes mellitus with hyperglycemia: Secondary | ICD-10-CM

## 2021-07-12 DIAGNOSIS — I10 Essential (primary) hypertension: Secondary | ICD-10-CM

## 2021-07-12 MED ORDER — LISINOPRIL-HYDROCHLOROTHIAZIDE 20-12.5 MG PO TABS: 1.0000 | ORAL_TABLET | Freq: Every day | ORAL | 2 refills | Status: DC

## 2021-07-12 MED ORDER — METFORMIN HCL 1000 MG PO TABS: 1000.0000 mg | ORAL_TABLET | Freq: Two times a day (BID) | ORAL | 2 refills | Status: DC

## 2021-07-12 NOTE — Discharge Instructions (Addendum)
Please take your medications as prescribed Try and find a primary care provider to help manage your diabetes mellitus If your blood sugars continue to be high you may benefit from insulin use We will call you with recommendations if labs are abnormal Please try to do moderate exercise at least 150 minutes a week. Return to urgent care if you have any further concerns.

## 2021-07-12 NOTE — ED Triage Notes (Signed)
States she needs a refill of her blood pressure medication  zestoretic.  States she had been out for 6 days.

## 2021-07-12 NOTE — ED Provider Notes (Addendum)
[facility]    CSN: [id number] Arrival date & time: 07/12/21  1302      History   Chief Complaint No chief complaint on file.   HPI Mallory Mcdowell is a 35 y.o. female with a history of diabetes mellitus and hypertension comes to urgent care for medication refill.  Patient takes Zestoretic 20-12.5 mg orally daily and metformin 500 mg twice daily.  Blood pressure is well controlled.  Patient denies any chest pain or chest pressure.  Blood sugars are poorly controlled with last hemoglobin A1c a year ago-11.4.  Patient is between primary care physicians.  She is run out of her medications for 6 days and would like to have a refill.[name]   HPI  Past Medical History:  Diagnosis Date   Diabetes mellitus without complication ([place])    Hypertension     Patient Active Problem List   Diagnosis Date Noted   Encounter for physical examination, contraception, and Papanicolaou smear 12/24/2016   Family planning 12/24/2016   Cigarette nicotine dependence without complication 45/80/9983   Uncontrolled diabetes mellitus type 2 without complications 38/25/0539   Elevated platelet count 05/01/2016   Alcohol abuse 05/01/2016    History reviewed. No pertinent surgical history.  OB History     Gravida  0   Para  0   Term  0   Preterm  0   AB  0   Living  0      SAB  0   IAB  0   Ectopic  0   Multiple  0   Live Births               Home Medications    Prior to Admission medications   Medication Sig Start Date End Date Taking? Authorizing Provider  lisinopril-hydrochlorothiazide (ZESTORETIC) 20-12.5 MG tablet Take 1 tablet by mouth daily. 07/12/21  Yes Douglass Dunshee, Myrene Galas, MD  metFORMIN (GLUCOPHAGE) 1000 MG tablet Take 1 tablet (1,000 mg total) by mouth 2 (two) times daily. 07/12/21  Yes Rusti Arizmendi, Myrene Galas, MD  Blood Glucose Monitoring Suppl w/Device KIT 1 each by Does not apply route in the morning, at noon, in the evening, and at bedtime. Check blood sugar 4  times daily 09/03/20   Florian Buff, MD  cetirizine (ZYRTEC ALLERGY) 10 MG tablet Take 1 tablet (10 mg total) by mouth daily. 09/10/20   Avegno, Darrelyn Hillock, FNP  cyclobenzaprine (FLEXERIL) 10 MG tablet Take 1 tablet (10 mg total) by mouth 3 (three) times daily as needed for muscle spasms. 7/67/34   Delora Fuel, MD  fluticasone [facility]) 50 MCG/ACT nasal spray Place 1 spray into both nostrils daily for 14 days. 09/10/20 09/24/20  Avegno, Darrelyn Hillock, FNP  meloxicam (MOBIC) 15 MG tablet Take 1 tablet (15 mg total) by mouth daily. 12/19/20   Faustino Congress, NP    Family History Family History  Problem Relation Age of Onset   Asthma Mother    Diabetes Other    Heart failure Other    Cancer Other     Social History Social History   Tobacco Use   Smoking status: Former    Packs/day: 0.50    Types: Cigarettes   Smokeless tobacco: Never  Vaping Use   Vaping Use: Never used  Substance Use Topics   Alcohol use: Yes    Alcohol/week: 1.0 standard drink    Types: 1 Cans of beer per week    Comment: sometimes daily, but not always   Drug use:  Yes    Types: Marijuana     Allergies   Amoxicillin   Review of Systems Review of Systems  Constitutional: Negative.   All other systems reviewed and are negative.   Physical Exam Triage Vital Signs ED Triage Vitals  Enc Vitals Group     BP 07/12/21 1310 (!) 138/92     Pulse Rate 07/12/21 1310 100     Resp 07/12/21 1310 16     Temp 07/12/21 1310 98.6 F (37 C)     Temp Source 07/12/21 1310 Oral     SpO2 07/12/21 1310 96 %     Weight --      Height --      Head Circumference --      Peak Flow --      Pain Score 07/12/21 1312 0     Pain Loc --      Pain Edu? --      Excl. in [place]? --    No data found.  Updated Vital Signs BP (!) 138/92 (BP Location: Right Arm)   Pulse 100   Temp 98.6 F (37 C) (Oral)   Resp 16   LMP 06/18/2021 (Approximate)   SpO2 96%   Visual Acuity Right Eye Distance:   Left Eye Distance:    Bilateral Distance:    Right Eye Near:   Left Eye Near:    Bilateral Near:     Physical Exam Vitals and nursing note reviewed.  Constitutional:      Appearance: She is not ill-appearing or diaphoretic.  Cardiovascular:     Rate and Rhythm: Normal rate and regular rhythm.     Pulses: Normal pulses.     Heart sounds: Normal heart sounds.  Pulmonary:     Effort: Pulmonary effort is normal.     Breath sounds: Normal breath sounds.  Neurological:     Mental Status: She is alert.     UC Treatments / Results  Labs (all labs ordered are listed, but only abnormal results are displayed) Labs Reviewed  BASIC METABOLIC PANEL  HEMOGLOBIN A1C    EKG   Radiology No results found.  Procedures Procedures (including critical care time)  Medications Ordered in UC Medications - No data to display  Initial Impression / Assessment and Plan / UC Course  I have reviewed the triage vital signs and the nursing notes.  Pertinent labs & imaging results that were available during my care of the patient were reviewed by me and considered in my medical decision making (see chart for details).     1.  Diabetes mellitus type 2: Patient's blood sugar was 264 today Hemoglobin A1c was 11.4-one years ago Patient is advised to increase metformin 2000 mg twice daily return precautions given BMP, hemoglobin A1c. Patient is encouraged to establish primary care physician care.  2.  Hypertension, controlled Medications refilled Final Clinical Impressions(s) / UC Diagnoses   Final diagnoses:  Essential hypertension, benign  Type 2 diabetes mellitus not at goal [facility])     Discharge Instructions      Please take your medications as prescribed Try and find a primary care provider to help manage your diabetes mellitus If your blood sugars continue to be high you may benefit from insulin use We will call you with recommendations if labs are abnormal Please try to do moderate exercise at  least 150 minutes a week. Return to urgent care if you have any further concerns.   ED Prescriptions     Medication  Sig Dispense Auth. Provider   lisinopril-hydrochlorothiazide (ZESTORETIC) 20-12.5 MG tablet Take 1 tablet by mouth daily. 30 tablet Lamptey, Myrene Galas, MD   metFORMIN (GLUCOPHAGE) 1000 MG tablet Take 1 tablet (1,000 mg total) by mouth 2 (two) times daily. 30 tablet Lamptey, Myrene Galas, MD      PDMP not reviewed this encounter.   Chase Picket, MD 07/12/21 1355    Chase Picket, MD 07/12/21 1356

## 2021-07-16 LAB — BASIC METABOLIC PANEL
BUN/Creatinine Ratio: 15 (ref 9–23)
BUN: 11 mg/dL (ref 6–20)
CO2: 17 mmol/L — ABNORMAL LOW (ref 20–29)
Calcium: 9.5 mg/dL (ref 8.7–10.2)
Chloride: 104 mmol/L (ref 96–106)
Creatinine, Ser: 0.72 mg/dL (ref 0.57–1.00)
Glucose: 276 mg/dL — ABNORMAL HIGH (ref 65–99)
Potassium: 4 mmol/L (ref 3.5–5.2)
Sodium: 138 mmol/L (ref 134–144)
eGFR: 112 mL/min/{1.73_m2} (ref >59)

## 2021-07-16 LAB — HEMOGLOBIN A1C
Est. average glucose Bld gHb Est-mCnc: 263 mg/dL
Hgb A1c MFr Bld: 10.8 % — ABNORMAL HIGH (ref 4.8–5.6)

## 2021-08-14 ENCOUNTER — Telehealth: Payer: Self-pay | Admitting: Internal Medicine

## 2021-08-14 MED ORDER — METFORMIN HCL 1000 MG PO TABS: 1000.0000 mg | ORAL_TABLET | Freq: Two times a day (BID) | ORAL | 2 refills | Status: DC

## 2021-08-14 NOTE — Telephone Encounter (Signed)
Metformin refill sent.

## 2021-08-28 ENCOUNTER — Ambulatory Visit: Payer: Medicaid Other | Admitting: Internal Medicine

## 2021-10-22 ENCOUNTER — Other Ambulatory Visit: Payer: Self-pay

## 2021-10-22 ENCOUNTER — Ambulatory Visit
Admission: EM | Admit: 2021-10-22 | Discharge: 2021-10-22 | Disposition: A | Discharge status: Home / Self Care | Payer: Medicaid Other | Attending: Family Medicine | Admitting: Family Medicine

## 2021-10-22 ENCOUNTER — Ambulatory Visit: Payer: Medicaid Other

## 2021-10-22 DIAGNOSIS — I10 Essential (primary) hypertension: Secondary | ICD-10-CM

## 2021-10-22 DIAGNOSIS — Z113 Encounter for screening for infections with a predominantly sexual mode of transmission: Secondary | ICD-10-CM

## 2021-10-22 DIAGNOSIS — Z7984 Long term (current) use of oral hypoglycemic drugs: Secondary | ICD-10-CM

## 2021-10-22 DIAGNOSIS — E0865 Diabetes mellitus due to underlying condition with hyperglycemia: Secondary | ICD-10-CM | POA: Insufficient documentation

## 2021-10-22 DIAGNOSIS — E1165 Type 2 diabetes mellitus with hyperglycemia: Secondary | ICD-10-CM

## 2021-10-22 DIAGNOSIS — E1159 Type 2 diabetes mellitus with other circulatory complications: Secondary | ICD-10-CM

## 2021-10-22 LAB — POCT FASTING CBG KUC MANUAL ENTRY: POCT Glucose (KUC): 259 mg/dL — AB (ref 70–99)

## 2021-10-22 MED ORDER — LISINOPRIL-HYDROCHLOROTHIAZIDE 20-12.5 MG PO TABS: 1.0000 | ORAL_TABLET | Freq: Every day | ORAL | 2 refills | Status: DC

## 2021-10-22 MED ORDER — METFORMIN HCL 1000 MG PO TABS: 1000.0000 mg | ORAL_TABLET | Freq: Two times a day (BID) | ORAL | 2 refills | Status: DC

## 2021-10-22 NOTE — ED Triage Notes (Signed)
Pt here for medication refill for metformin and lisinopril

## 2021-10-22 NOTE — ED Provider Notes (Signed)
[place]   [id number] 10/22/21 Arrival Time: 0919  ASSESSMENT & PLAN:  1. Diabetes mellitus due to underlying condition with hyperglycemia, without long-term current use of insulin ([place])   2. Essential hypertension   3. Screening for STD (sexually transmitted disease)    Labs Reviewed  POCT FASTING CBG [facility] MANUAL ENTRY - Abnormal; Notable for the following components:      Result Value   POCT Glucose ([facility]) 259 (*)    All other components within normal limits  CERVICOVAGINAL ANCILLARY ONLY   Vaginal cytology pending. Refilled HTN/DM medications. Set up with PCP via ipad.  Meds ordered this encounter  Medications   lisinopril-hydrochlorothiazide (ZESTORETIC) 20-12.5 MG tablet    Sig: Take 1 tablet by mouth daily.    Dispense:  30 tablet    Refill:  2   metFORMIN (GLUCOPHAGE) 1000 MG tablet    Sig: Take 1 tablet (1,000 mg total) by mouth 2 (two) times daily.    Dispense:  60 tablet    Refill:  2     Follow-up Information     [facility] at [facility].   Specialty: Urgent Care Why: As needed. Contact information: [address] [place] [address] [phone]                Reviewed expectations re: course of current medical issues. Questions answered. Outlined signs and symptoms indicating need for more acute intervention. Understanding verbalized. After Visit Summary given.   SUBJECTIVE: History from: patient. Mallory Mcdowell is a 35 y.o. female who reports: elevated blood sugars. Needs refill of HTN/DM medications. Also requests vaginal cytology "for a checkup". Denies: fever and difficulty breathing. Normal PO intake without n/v/d.  OBJECTIVE:  Vitals:   10/22/21 1012  BP: 137/90  Pulse: 92  Resp: 18  Temp: 98 F (36.7 C)  SpO2: 97%    General appearance: alert; no distress Eyes: PERRLA; EOMI; conjunctiva normal Lungs: speaks full sentences without difficulty;  unlabored Extremities: no edema GU: deferred Skin: warm and dry Neurologic: normal gait Psychological: alert and cooperative; normal mood and affect  Labs: Results for orders placed or performed during the hospital encounter of 10/22/21  POCT CBG (manual entry)  Result Value Ref Range   POCT Glucose ([facility]) 259 (A) 70 - 99 mg/dL   Labs Reviewed  POCT FASTING CBG [facility] MANUAL ENTRY - Abnormal; Notable for the following components:      Result Value   POCT Glucose ([facility]) 259 (*)    All other components within normal limits    Allergies  Allergen Reactions   Amoxicillin Swelling    Past Medical History:  Diagnosis Date   Diabetes mellitus without complication (HCC)    Hypertension    Social History   Socioeconomic History   Marital status: Single    Spouse name: Not on file   Number of children: Not on file   Years of education: Not on file   Highest education level: Not on file  Occupational History   Not on file  Tobacco Use   Smoking status: Former    Packs/day: 0.50    Types: Cigarettes   Smokeless tobacco: Never  Vaping Use   Vaping Use: Never used  Substance and Sexual Activity   Alcohol use: Yes    Alcohol/week: 1.0 standard drink    Types: 1 Cans of beer per week    Comment: sometimes daily, but not always   Drug use: Yes  Types: Marijuana   Sexual activity: Yes    Birth control/protection: None  Other Topics Concern   Not on file  Social History Narrative   Not on file   Social Determinants of Health   Financial Resource Strain: Not on file  Food Insecurity: Not on file  Transportation Needs: Not on file  Physical Activity: Not on file  Stress: Not on file  Social Connections: Not on file  Intimate Partner Violence: Not on file   Family History  Problem Relation Age of Onset   Asthma Mother    Diabetes Other    Heart failure Other    Cancer Other    History reviewed. No pertinent surgical history.   Vanessa Kick, MD 10/22/21  1037

## 2021-10-22 NOTE — Discharge Instructions (Addendum)
We have sent testing for causes of vaginal infections. We will notify you of any positive results once they are received. If required, we will prescribe any medications you might need.  Please refrain from all sexual activity for at least the next seven days.

## 2021-10-23 LAB — CERVICOVAGINAL ANCILLARY ONLY
Bacterial Vaginitis (gardnerella): POSITIVE — AB
Candida Glabrata: NEGATIVE
Candida Vaginitis: POSITIVE — AB
Chlamydia: NEGATIVE
Comment: NEGATIVE
Comment: NEGATIVE
Comment: NEGATIVE
Comment: NEGATIVE
Comment: NEGATIVE
Comment: NORMAL
Neisseria Gonorrhea: POSITIVE — AB
Trichomonas: NEGATIVE

## 2021-10-24 ENCOUNTER — Telehealth (HOSPITAL_COMMUNITY): Payer: Self-pay | Admitting: Emergency Medicine

## 2021-10-24 ENCOUNTER — Ambulatory Visit
Admission: EM | Admit: 2021-10-24 | Discharge: 2021-10-24 | Disposition: A | Discharge status: Home / Self Care | Payer: Medicaid Other | Attending: Family Medicine | Admitting: Family Medicine

## 2021-10-24 DIAGNOSIS — A549 Gonococcal infection, unspecified: Secondary | ICD-10-CM

## 2021-10-24 MED ORDER — METRONIDAZOLE 500 MG PO TABS: 500.0000 mg | ORAL_TABLET | Freq: Two times a day (BID) | ORAL | 0 refills | Status: DC

## 2021-10-24 MED ORDER — FLUCONAZOLE 150 MG PO TABS: 150.0000 mg | ORAL_TABLET | Freq: Once | ORAL | 0 refills | Status: AC

## 2021-10-24 MED ORDER — CEFTRIAXONE SODIUM 500 MG IJ SOLR
500.0000 mg | Freq: Once | INTRAMUSCULAR | Status: AC
  Administered 2021-10-24: 500 mg via INTRAMUSCULAR

## 2021-10-24 NOTE — ED Triage Notes (Signed)
Patient states her PCP sent her here for Medication and refills.

## 2021-10-28 ENCOUNTER — Ambulatory Visit: Payer: Self-pay | Admitting: *Deleted

## 2021-10-28 NOTE — Telephone Encounter (Signed)
I attempted to return pt's call.  She had called in earlier today with questions about STD symptoms for a female.  I left a voicemail to call back and any of the nurses would be glad to assist her.

## 2021-11-14 ENCOUNTER — Ambulatory Visit: Payer: Medicaid Other

## 2021-11-15 ENCOUNTER — Other Ambulatory Visit: Payer: Self-pay

## 2021-11-15 ENCOUNTER — Ambulatory Visit
Admission: EM | Admit: 2021-11-15 | Discharge: 2021-11-15 | Disposition: A | Discharge status: Home / Self Care | Payer: Self-pay | Attending: Urgent Care | Admitting: Urgent Care

## 2021-11-15 DIAGNOSIS — Z0189 Encounter for other specified special examinations: Secondary | ICD-10-CM | POA: Insufficient documentation

## 2021-11-15 DIAGNOSIS — Z7251 High risk heterosexual behavior: Secondary | ICD-10-CM | POA: Insufficient documentation

## 2021-11-15 DIAGNOSIS — Z8619 Personal history of other infectious and parasitic diseases: Secondary | ICD-10-CM | POA: Insufficient documentation

## 2021-11-15 DIAGNOSIS — Z202 Contact with and (suspected) exposure to infections with a predominantly sexual mode of transmission: Secondary | ICD-10-CM

## 2021-11-15 NOTE — ED Provider Notes (Signed)
[name]   MRN: [id number] DOB: 1986/08/28  Subjective:   Mallory Mcdowell is a 36 y.o. female presenting for recheck, cervical swab. Patient was sexually active, does not use protection.  Her partner ended up giving her gonorrhea.  She tested positive for this in mid December, also tested positive for UTI, BV and yeast infections.  She wants to make sure that she gets rechecked to see if she clear the infections. Denies fever, n/v, abdominal pain, pelvic pain, rashes, dysuria, urinary frequency, hematuria, vaginal discharge.    No current facility-administered medications for this encounter.  Current Outpatient Medications:    Blood Glucose Monitoring Suppl w/Device KIT, 1 each by Does not apply route in the morning, at noon, in the evening, and at bedtime. Check blood sugar 4 times daily, Disp: 1 kit, Rfl: 0   cetirizine (ZYRTEC ALLERGY) 10 MG tablet, Take 1 tablet (10 mg total) by mouth daily., Disp: 30 tablet, Rfl: 0   cyclobenzaprine (FLEXERIL) 10 MG tablet, Take 1 tablet (10 mg total) by mouth 3 (three) times daily as needed for muscle spasms., Disp: 30 tablet, Rfl: 0   fluticasone (FLONASE) 50 MCG/ACT nasal spray, Place 1 spray into both nostrils daily for 14 days., Disp: 16 g, Rfl: 0   lisinopril-hydrochlorothiazide (ZESTORETIC) 20-12.5 MG tablet, Take 1 tablet by mouth daily., Disp: 30 tablet, Rfl: 2   meloxicam (MOBIC) 15 MG tablet, Take 1 tablet (15 mg total) by mouth daily., Disp: 30 tablet, Rfl: 0   metFORMIN (GLUCOPHAGE) 1000 MG tablet, Take 1 tablet (1,000 mg total) by mouth 2 (two) times daily., Disp: 60 tablet, Rfl: 2   metroNIDAZOLE (FLAGYL) 500 MG tablet, Take 1 tablet (500 mg total) by mouth 2 (two) times daily., Disp: 14 tablet, Rfl: 0   Allergies  Allergen Reactions   Amoxicillin Swelling    Past Medical History:  Diagnosis Date   Diabetes mellitus without complication ([place])    Hypertension      No past surgical history on file.  Family  History  Problem Relation Age of Onset   Asthma Mother    Diabetes Other    Heart failure Other    Cancer Other     Social History   Tobacco Use   Smoking status: Former    Packs/day: 0.50    Types: Cigarettes   Smokeless tobacco: Never  Vaping Use   Vaping Use: Never used  Substance Use Topics   Alcohol use: Yes    Alcohol/week: 1.0 standard drink    Types: 1 Cans of beer per week    Comment: sometimes daily, but not always   Drug use: Yes    Types: Marijuana    ROS   Objective:   Vitals: BP (!) 134/91 (BP Location: Right Arm)    Pulse 99    Temp 98 F (36.7 C) (Oral)    Resp 16    LMP 11/07/2021 (Exact Date)    SpO2 95%   Physical Exam Constitutional:      General: She is not in acute distress.    Appearance: Normal appearance. She is well-developed. She is not ill-appearing.  HENT:     Head: Normocephalic and atraumatic.     Nose: Nose normal.     Mouth/Throat:     Mouth: Mucous membranes are moist.     Pharynx: Oropharynx is clear.  Eyes:     General: No scleral icterus.    Extraocular Movements: Extraocular movements intact.  Cardiovascular:  Rate and Rhythm: Normal rate.  Pulmonary:     Effort: Pulmonary effort is normal.  Skin:    General: Skin is warm and dry.  Neurological:     General: No focal deficit present.     Mental Status: She is alert and oriented to person, place, and time.  Psychiatric:        Mood and Affect: Mood normal.        Behavior: Behavior normal.        Thought Content: Thought content normal.        Judgment: Judgment normal.    Assessment and Plan :   PDMP not reviewed this encounter.  1. Unprotected sex   2. Patient requested diagnostic testing   3. History of gonorrhea    Labs pending, will treat as appropriate based off of results.   Jaynee Eagles, PA-C 11/15/21 1324

## 2021-11-15 NOTE — ED Triage Notes (Signed)
Pt presents for STDs tets. Denies nay symptoms.

## 2021-11-16 ENCOUNTER — Ambulatory Visit: Payer: Medicaid Other

## 2021-11-17 ENCOUNTER — Ambulatory Visit: Payer: Medicaid Other | Admitting: Obstetrics & Gynecology

## 2021-11-17 LAB — CERVICOVAGINAL ANCILLARY ONLY
Bacterial Vaginitis (gardnerella): POSITIVE — AB
Candida Glabrata: POSITIVE — AB
Candida Vaginitis: NEGATIVE
Chlamydia: NEGATIVE
Comment: NEGATIVE
Comment: NEGATIVE
Comment: NEGATIVE
Comment: NEGATIVE
Comment: NEGATIVE
Comment: NORMAL
Neisseria Gonorrhea: NEGATIVE
Trichomonas: NEGATIVE

## 2021-11-18 ENCOUNTER — Telehealth (HOSPITAL_COMMUNITY): Payer: Self-pay | Admitting: Emergency Medicine

## 2021-11-18 MED ORDER — METRONIDAZOLE 500 MG PO TABS: 500.0000 mg | ORAL_TABLET | Freq: Two times a day (BID) | ORAL | 0 refills | Status: DC

## 2021-11-18 MED ORDER — FLUCONAZOLE 150 MG PO TABS: 150.0000 mg | ORAL_TABLET | Freq: Once | ORAL | 0 refills | Status: AC

## 2021-12-10 ENCOUNTER — Ambulatory Visit
Admission: EM | Admit: 2021-12-10 | Discharge: 2021-12-10 | Disposition: A | Discharge status: Home / Self Care | Payer: Self-pay | Attending: Urgent Care | Admitting: Urgent Care

## 2021-12-10 ENCOUNTER — Encounter: Payer: Self-pay | Admitting: Emergency Medicine

## 2021-12-10 ENCOUNTER — Other Ambulatory Visit: Payer: Self-pay

## 2021-12-10 DIAGNOSIS — B9689 Other specified bacterial agents as the cause of diseases classified elsewhere: Secondary | ICD-10-CM | POA: Insufficient documentation

## 2021-12-10 DIAGNOSIS — B3731 Acute candidiasis of vulva and vagina: Secondary | ICD-10-CM | POA: Insufficient documentation

## 2021-12-10 DIAGNOSIS — N760 Acute vaginitis: Secondary | ICD-10-CM | POA: Insufficient documentation

## 2021-12-10 NOTE — ED Triage Notes (Signed)
Pt reports was seen x2 weeks ago and reports was dx with "BV,Yeast infection, Gonorrhea" and wants to make sure its gone.

## 2021-12-10 NOTE — Discharge Instructions (Addendum)
You have already completed your treatment for bacterial vaginosis and a yeast infection of the vaginal area.  If you no longer have any symptoms then you do not need any more medication.  We are happy to do the tests for you again but our nurse will not prescribe medicines since you will need any if you have no symptoms.

## 2021-12-10 NOTE — ED Provider Notes (Signed)
[place]   MRN: [id number] DOB: [birth year]  Subjective:   Mallory Mcdowell is a 36 y.o. female presenting for recheck on bacterial vaginosis and yeast infection, STIs.  Wants to make sure that her tests are negative. Denies fever, n/v, abdominal pain, pelvic pain, rashes, dysuria, urinary frequency, hematuria, vaginal discharge.  Did complete treatment from having yeast vaginitis and BV from earlier in January.   No current facility-administered medications for this encounter.  Current Outpatient Medications:    Blood Glucose Monitoring Suppl w/Device KIT, 1 each by Does not apply route in the morning, at noon, in the evening, and at bedtime. Check blood sugar 4 times daily, Disp: 1 kit, Rfl: 0   cetirizine (ZYRTEC ALLERGY) 10 MG tablet, Take 1 tablet (10 mg total) by mouth daily., Disp: 30 tablet, Rfl: 0   cyclobenzaprine (FLEXERIL) 10 MG tablet, Take 1 tablet (10 mg total) by mouth 3 (three) times daily as needed for muscle spasms., Disp: 30 tablet, Rfl: 0   fluticasone (FLONASE) 50 MCG/ACT nasal spray, Place 1 spray into both nostrils daily for 14 days., Disp: 16 g, Rfl: 0   lisinopril-hydrochlorothiazide (ZESTORETIC) 20-12.5 MG tablet, Take 1 tablet by mouth daily., Disp: 30 tablet, Rfl: 2   meloxicam (MOBIC) 15 MG tablet, Take 1 tablet (15 mg total) by mouth daily., Disp: 30 tablet, Rfl: 0   metFORMIN (GLUCOPHAGE) 1000 MG tablet, Take 1 tablet (1,000 mg total) by mouth 2 (two) times daily., Disp: 60 tablet, Rfl: 2   metroNIDAZOLE (FLAGYL) 500 MG tablet, Take 1 tablet (500 mg total) by mouth 2 (two) times daily., Disp: 14 tablet, Rfl: 0   Allergies  Allergen Reactions   Amoxicillin Swelling and Anaphylaxis    Past Medical History:  Diagnosis Date   Diabetes mellitus without complication ([place])    Hypertension      History reviewed. No pertinent surgical history.  Family History  Problem Relation Age of Onset   Asthma Mother    Diabetes Other    Heart  failure Other    Cancer Other     Social History   Tobacco Use   Smoking status: Former    Packs/day: 0.50    Types: Cigarettes   Smokeless tobacco: Never  Vaping Use   Vaping Use: Never used  Substance Use Topics   Alcohol use: Yes    Alcohol/week: 1.0 standard drink    Types: 1 Cans of beer per week    Comment: sometimes daily, but not always   Drug use: Yes    Types: Marijuana    ROS   Objective:   Vitals: BP 124/87 (BP Location: Right Arm)    Pulse 87    Temp 97.8 F (36.6 C) (Oral)    Resp 18    Ht _0  (1.651 m)    Wt 180 lb (81.6 kg)    LMP 12/08/2021 (Approximate)    SpO2 97%    BMI 29.95 kg/m   Physical Exam Constitutional:      General: She is not in acute distress.    Appearance: Normal appearance. She is well-developed. She is not ill-appearing, toxic-appearing or diaphoretic.  HENT:     Head: Normocephalic and atraumatic.     Nose: Nose normal.     Mouth/Throat:     Mouth: Mucous membranes are moist.  Eyes:     General: No scleral icterus.       Right eye: No discharge.        Left eye: No  discharge.     Extraocular Movements: Extraocular movements intact.  Cardiovascular:     Rate and Rhythm: Normal rate.  Pulmonary:     Effort: Pulmonary effort is normal.  Skin:    General: Skin is warm and dry.  Neurological:     General: No focal deficit present.     Mental Status: She is alert and oriented to person, place, and time.  Psychiatric:        Mood and Affect: Mood normal.        Behavior: Behavior normal.    Assessment and Plan :   PDMP not reviewed this encounter.  1. Yeast vaginitis   2. Bacterial vaginosis    I counseled patient that she will need treatment unless she had symptoms for yeast or BV infection.  She verbalized understanding and wanted to have the testing done anyway.  Encouraged safe sex practices.  Labs pending.   Jaynee Eagles, PA-C 12/10/21 1311

## 2021-12-11 LAB — CERVICOVAGINAL ANCILLARY ONLY
Bacterial Vaginitis (gardnerella): POSITIVE — AB
Chlamydia: NEGATIVE
Comment: NEGATIVE
Comment: NEGATIVE
Comment: NEGATIVE
Comment: NORMAL
Neisseria Gonorrhea: NEGATIVE
Trichomonas: NEGATIVE

## 2021-12-12 ENCOUNTER — Telehealth (HOSPITAL_COMMUNITY): Payer: Self-pay | Admitting: Emergency Medicine

## 2021-12-12 MED ORDER — METRONIDAZOLE 500 MG PO TABS: 500.0000 mg | ORAL_TABLET | Freq: Two times a day (BID) | ORAL | 0 refills | Status: DC

## 2021-12-15 ENCOUNTER — Emergency Department (HOSPITAL_COMMUNITY)
Admission: EM | Admit: 2021-12-15 | Discharge: 2021-12-16 | Disposition: A | Discharge status: Home / Self Care | Payer: Medicaid Other | Attending: Student | Admitting: Student

## 2021-12-15 ENCOUNTER — Encounter (HOSPITAL_COMMUNITY): Payer: Self-pay | Admitting: Emergency Medicine

## 2021-12-15 ENCOUNTER — Other Ambulatory Visit: Payer: Self-pay

## 2021-12-15 DIAGNOSIS — E876 Hypokalemia: Secondary | ICD-10-CM | POA: Insufficient documentation

## 2021-12-15 DIAGNOSIS — R638 Other symptoms and signs concerning food and fluid intake: Secondary | ICD-10-CM | POA: Insufficient documentation

## 2021-12-15 DIAGNOSIS — I10 Essential (primary) hypertension: Secondary | ICD-10-CM | POA: Insufficient documentation

## 2021-12-15 DIAGNOSIS — K529 Noninfective gastroenteritis and colitis, unspecified: Secondary | ICD-10-CM | POA: Insufficient documentation

## 2021-12-15 DIAGNOSIS — Z79899 Other long term (current) drug therapy: Secondary | ICD-10-CM | POA: Insufficient documentation

## 2021-12-15 DIAGNOSIS — Z87891 Personal history of nicotine dependence: Secondary | ICD-10-CM | POA: Insufficient documentation

## 2021-12-15 DIAGNOSIS — E878 Other disorders of electrolyte and fluid balance, not elsewhere classified: Secondary | ICD-10-CM | POA: Insufficient documentation

## 2021-12-15 DIAGNOSIS — E119 Type 2 diabetes mellitus without complications: Secondary | ICD-10-CM | POA: Insufficient documentation

## 2021-12-15 DIAGNOSIS — Z20822 Contact with and (suspected) exposure to covid-19: Secondary | ICD-10-CM | POA: Insufficient documentation

## 2021-12-15 DIAGNOSIS — Z7984 Long term (current) use of oral hypoglycemic drugs: Secondary | ICD-10-CM | POA: Insufficient documentation

## 2021-12-15 DIAGNOSIS — E871 Hypo-osmolality and hyponatremia: Secondary | ICD-10-CM | POA: Insufficient documentation

## 2021-12-15 DIAGNOSIS — R000 Tachycardia, unspecified: Secondary | ICD-10-CM | POA: Insufficient documentation

## 2021-12-15 LAB — URINALYSIS, ROUTINE W REFLEX MICROSCOPIC
Bilirubin Urine: NEGATIVE
Glucose, UA: >=500 mg/dL — AB
Hgb urine dipstick: NEGATIVE
Ketones, ur: NEGATIVE mg/dL
Leukocytes,Ua: NEGATIVE
Nitrite: NEGATIVE
Protein, ur: NEGATIVE mg/dL
Specific Gravity, Urine: 1.028 (ref 1.005–1.030)
pH: 5 (ref 5.0–8.0)

## 2021-12-15 LAB — COMPREHENSIVE METABOLIC PANEL
ALT: 28 U/L (ref 0–44)
AST: 17 U/L (ref 15–41)
Albumin: 4 g/dL (ref 3.5–5.0)
Alkaline Phosphatase: 89 U/L (ref 38–126)
Anion gap: 14 (ref 5–15)
BUN: 19 mg/dL (ref 6–20)
CO2: 25 mmol/L (ref 22–32)
Calcium: 8.6 mg/dL — ABNORMAL LOW (ref 8.9–10.3)
Chloride: 95 mmol/L — ABNORMAL LOW (ref 98–111)
Creatinine, Ser: 0.71 mg/dL (ref 0.44–1.00)
GFR, Estimated: >60 mL/min (ref >60)
Glucose, Bld: 333 mg/dL — ABNORMAL HIGH (ref 70–99)
Potassium: 3.2 mmol/L — ABNORMAL LOW (ref 3.5–5.1)
Sodium: 134 mmol/L — ABNORMAL LOW (ref 135–145)
Total Bilirubin: 0.3 mg/dL (ref 0.3–1.2)
Total Protein: 7.6 g/dL (ref 6.5–8.1)

## 2021-12-15 LAB — CBC
HCT: 40.4 % (ref 36.0–46.0)
Hemoglobin: 13.5 g/dL (ref 12.0–15.0)
MCH: 29.2 pg (ref 26.0–34.0)
MCHC: 33.4 g/dL (ref 30.0–36.0)
MCV: 87.3 fL (ref 80.0–100.0)
Platelets: 419 10*3/uL — ABNORMAL HIGH (ref 150–400)
RBC: 4.63 MIL/uL (ref 3.87–5.11)
RDW: 13.2 % (ref 11.5–15.5)
WBC: 8.1 10*3/uL (ref 4.0–10.5)
nRBC: 0 % (ref 0.0–0.2)

## 2021-12-15 LAB — POC URINE PREG, ED: Preg Test, Ur: NEGATIVE

## 2021-12-15 LAB — CBG MONITORING, ED: Glucose-Capillary: 301 mg/dL — ABNORMAL HIGH (ref 70–99)

## 2021-12-15 LAB — LIPASE, BLOOD: Lipase: 35 U/L (ref 11–51)

## 2021-12-15 MED ORDER — LACTATED RINGERS IV BOLUS
1000.0000 mL | Freq: Once | INTRAVENOUS | Status: AC
  Administered 2021-12-16: 1000 mL via INTRAVENOUS

## 2021-12-15 NOTE — ED Triage Notes (Signed)
Pt states she has been vomiting and having diarrhea since Saturday.

## 2021-12-16 LAB — RESP PANEL BY RT-PCR (FLU A&B, COVID) ARPGX2
Influenza A by PCR: NEGATIVE
Influenza B by PCR: NEGATIVE
SARS Coronavirus 2 by RT PCR: NEGATIVE

## 2021-12-16 MED ORDER — ONDANSETRON HCL 4 MG/2ML IJ SOLN
4.0000 mg | Freq: Once | INTRAMUSCULAR | Status: AC
  Administered 2021-12-16: 4 mg via INTRAVENOUS
  Filled 2021-12-16: qty 2

## 2021-12-16 MED ORDER — ONDANSETRON 4 MG PO TBDP: 4.0000 mg | ORAL_TABLET | Freq: Three times a day (TID) | ORAL | 0 refills | Status: DC | PRN

## 2021-12-16 NOTE — ED Notes (Signed)
Pt given warm blanket.

## 2021-12-16 NOTE — ED Provider Notes (Signed)
[facility] EMERGENCY DEPARTMENT Provider Note  CSN: [id number] Arrival date & time: 12/15/21 2123  Chief Complaint(s) Emesis  HPI Mallory Mcdowell is a 36 y.o. female with PMH T2DM, HTN who presents emergency department for evaluation of nausea, vomiting and diarrhea.  Patient states that her symptoms of been present for 3 days.  She states that she had initial improvement yesterday but her symptoms returned and her work sent her home to be evaluated by physician.  She denies abdominal pain, chest pain, shortness of breath, fever or other systemic symptoms.  No blood in stool.  States that she feels dehydrated.   Emesis Associated symptoms: diarrhea    Past Medical History Past Medical History:  Diagnosis Date   Diabetes mellitus without complication ([place])    Hypertension    Patient Active Problem List   Diagnosis Date Noted   Encounter for physical examination, contraception, and Papanicolaou smear 12/24/2016   Family planning 12/24/2016   Cigarette nicotine dependence without complication 75/08/2584   Uncontrolled diabetes mellitus type 2 without complications 27/78/2423   Elevated platelet count 05/01/2016   Alcohol abuse 05/01/2016   Home Medication(s) Prior to Admission medications   Medication Sig Start Date End Date Taking? Authorizing Provider  Blood Glucose Monitoring Suppl w/Device KIT 1 each by Does not apply route in the morning, at noon, in the evening, and at bedtime. Check blood sugar 4 times daily 09/03/20   Mallory Buff, MD  cetirizine (ZYRTEC ALLERGY) 10 MG tablet Take 1 tablet (10 mg total) by mouth daily. 09/10/20   Mallory Mcdowell, Mallory Hillock, FNP  cyclobenzaprine (FLEXERIL) 10 MG tablet Take 1 tablet (10 mg total) by mouth 3 (three) times daily as needed for muscle spasms. 5/36/14   Mallory Fuel, MD  fluticasone [facility]) 50 MCG/ACT nasal spray Place 1 spray into both nostrils daily for 14 days. 09/10/20 09/24/20  Mallory Mcdowell, Mallory Hillock, FNP   lisinopril-hydrochlorothiazide (ZESTORETIC) 20-12.5 MG tablet Take 1 tablet by mouth daily. 10/22/21   Mallory Kick, MD  meloxicam (MOBIC) 15 MG tablet Take 1 tablet (15 mg total) by mouth daily. 12/19/20   Mallory Congress, NP  metFORMIN (GLUCOPHAGE) 1000 MG tablet Take 1 tablet (1,000 mg total) by mouth 2 (two) times daily. 10/22/21   Mallory Kick, MD  metroNIDAZOLE (FLAGYL) 500 MG tablet Take 1 tablet (500 mg total) by mouth 2 (two) times daily. 12/12/21   Mallory Galas, MD                                                                                                                                    Past Surgical History History reviewed. No pertinent surgical history. Family History Family History  Problem Relation Age of Onset   Asthma Mother    Diabetes Other    Heart failure Other    Cancer Other     Social History Social History   Tobacco Use   Smoking status:  Former    Packs/day: 0.50    Types: Cigarettes   Smokeless tobacco: Never  Vaping Use   Vaping Use: Never used  Substance Use Topics   Alcohol use: Yes    Alcohol/week: 1.0 standard drink    Types: 1 Cans of beer per week    Comment: sometimes daily, but not always   Drug use: Yes    Types: Marijuana   Allergies Amoxicillin  Review of Systems Review of Systems  Gastrointestinal:  Positive for diarrhea, nausea and vomiting.   Physical Exam Vital Signs  I have reviewed the triage vital signs BP 117/82 (BP Location: Right Arm)    Pulse (!) 110    Temp 98.1 F (36.7 C) (Oral)    Resp 18    Ht _0  (1.651 m)    Wt 82 kg    LMP 12/02/2021 (Approximate)    SpO2 100%    BMI 30.08 kg/m   Physical Exam Vitals and nursing note reviewed.  Constitutional:      General: She is not in acute distress.    Appearance: She is well-developed.  HENT:     Head: Normocephalic and atraumatic.  Eyes:     Conjunctiva/sclera: Conjunctivae normal.  Cardiovascular:     Rate and Rhythm: Regular rhythm.  Tachycardia present.     Heart sounds: No murmur heard. Pulmonary:     Effort: Pulmonary effort is normal. No respiratory distress.     Breath sounds: Normal breath sounds.  Abdominal:     Palpations: Abdomen is soft.     Tenderness: There is no abdominal tenderness.  Musculoskeletal:        General: No swelling.     Cervical back: Neck supple.  Skin:    General: Skin is warm and dry.     Capillary Refill: Capillary refill takes less than 2 seconds.  Neurological:     Mental Status: She is alert.  Psychiatric:        Mood and Affect: Mood normal.    ED Results and Treatments Labs (all labs ordered are listed, but only abnormal results are displayed) Labs Reviewed  COMPREHENSIVE METABOLIC PANEL - Abnormal; Notable for the following components:      Result Value   Sodium 134 (*)    Potassium 3.2 (*)    Chloride 95 (*)    Glucose, Bld 333 (*)    Calcium 8.6 (*)    All other components within normal limits  CBC - Abnormal; Notable for the following components:   Platelets 419 (*)    All other components within normal limits  URINALYSIS, ROUTINE W REFLEX MICROSCOPIC - Abnormal; Notable for the following components:   Glucose, UA >=500 (*)    Bacteria, UA RARE (*)    All other components within normal limits  CBG MONITORING, ED - Abnormal; Notable for the following components:   Glucose-Capillary 301 (*)    All other components within normal limits  RESP PANEL BY RT-PCR (FLU A&B, COVID) ARPGX2  LIPASE, BLOOD  POC URINE PREG, ED  Radiology No results found.  Pertinent labs & imaging results that were available during my care of the patient were reviewed by me and considered in my medical decision making (see MDM for details).  Medications Ordered in ED Medications  lactated ringers bolus 1,000 mL (1,000 mLs Intravenous New Bag/Given 12/16/21 0032)   ondansetron (ZOFRAN) injection 4 mg (4 mg Intravenous Given 12/16/21 0026)                                                                                                                                     Procedures Procedures  (including critical care time)  Medical Decision Making / ED Course   This patient presents to the ED for concern of vomiting, diarrhea, this involves an extensive number of treatment options, and is a complaint that carries with it a high risk of complications and morbidity.  The differential diagnosis includes viral gastroenteritis, intra-abdominal infection, SBO, COVID-19, influenza  MDM: Patient seen emergency department for evaluation of vomiting diarrhea.  Physical exam is unremarkable.  Laboratory evaluation with mild hyponatremia 134, mild hypokalemia to 3.2, mild hypochloremia to 95 likely secondary to decreased p.o. intake.  Urinalysis negative, lipase negative, COVID and flu negative, pregnancy negative.  Patient given lactated Ringer's and Zofran and on reevaluation her symptoms had resolved.  Patient presentation consistent with viral gastroenteritis, and as the patient has no abdominal pain, we will defer imaging at this time.  Patient safe for discharge with outpatient PCP follow-up.  Patient discharged on Zofran ODT.   Additional history obtained:  -External records from outside source obtained and reviewed including: Chart review including previous notes, labs, imaging, consultation notes   Lab Tests: -I ordered, reviewed, and interpreted labs.   The pertinent results include:   Labs Reviewed  COMPREHENSIVE METABOLIC PANEL - Abnormal; Notable for the following components:      Result Value   Sodium 134 (*)    Potassium 3.2 (*)    Chloride 95 (*)    Glucose, Bld 333 (*)    Calcium 8.6 (*)    All other components within normal limits  CBC - Abnormal; Notable for the following components:   Platelets 419 (*)    All other components within  normal limits  URINALYSIS, ROUTINE W REFLEX MICROSCOPIC - Abnormal; Notable for the following components:   Glucose, UA >=500 (*)    Bacteria, UA RARE (*)    All other components within normal limits  CBG MONITORING, ED - Abnormal; Notable for the following components:   Glucose-Capillary 301 (*)    All other components within normal limits  RESP PANEL BY RT-PCR (FLU A&B, COVID) ARPGX2  LIPASE, BLOOD  POC URINE PREG, ED       Medicines ordered and prescription drug management: Meds ordered this encounter  Medications   lactated ringers bolus 1,000 mL   ondansetron (ZOFRAN) injection 4 mg    -I have reviewed the  patients home medicines and have made adjustments as needed  Critical interventions none   Cardiac Monitoring: The patient was maintained on a cardiac monitor.  I personally viewed and interpreted the cardiac monitored which showed an underlying rhythm of: NSR  Social Determinants of Health:  Factors impacting patients care include: none   Reevaluation: After the interventions noted above, I reevaluated the patient and found that they have :improved  Co morbidities that complicate the patient evaluation  Past Medical History:  Diagnosis Date   Diabetes mellitus without complication ([place])    Hypertension       Dispostion: I considered admission for this patient, but patient presentation consistent with viral gastroenteritis and she is safe for outpatient follow-up.     Final Clinical Impression(s) / ED Diagnoses Final diagnoses:  None     _0 @    Makenzie Vittorio, Debe Coder, MD 12/16/21 0500

## 2021-12-30 ENCOUNTER — Ambulatory Visit: Payer: Medicaid Other

## 2022-01-01 ENCOUNTER — Ambulatory Visit
Admission: RE | Admit: 2022-01-01 | Discharge: 2022-01-01 | Disposition: A | Discharge status: Home / Self Care | Payer: Self-pay | Source: Ambulatory Visit | Attending: Urgent Care | Admitting: Urgent Care

## 2022-01-01 ENCOUNTER — Other Ambulatory Visit: Payer: Self-pay

## 2022-01-01 VITALS — BP 138/73 | HR 97 | Temp 98.2°F | Resp 16

## 2022-01-01 DIAGNOSIS — E1159 Type 2 diabetes mellitus with other circulatory complications: Secondary | ICD-10-CM

## 2022-01-01 DIAGNOSIS — E11628 Type 2 diabetes mellitus with other skin complications: Secondary | ICD-10-CM

## 2022-01-01 DIAGNOSIS — N760 Acute vaginitis: Secondary | ICD-10-CM | POA: Insufficient documentation

## 2022-01-01 DIAGNOSIS — E119 Type 2 diabetes mellitus without complications: Secondary | ICD-10-CM | POA: Insufficient documentation

## 2022-01-01 DIAGNOSIS — I10 Essential (primary) hypertension: Secondary | ICD-10-CM | POA: Insufficient documentation

## 2022-01-01 MED ORDER — METRONIDAZOLE 500 MG PO TABS: 500.0000 mg | ORAL_TABLET | Freq: Two times a day (BID) | ORAL | 0 refills | Status: DC

## 2022-01-01 MED ORDER — FLUCONAZOLE 150 MG PO TABS: 150.0000 mg | ORAL_TABLET | ORAL | 0 refills | Status: DC

## 2022-01-01 MED ORDER — LISINOPRIL 10 MG PO TABS: 10.0000 mg | ORAL_TABLET | Freq: Every day | ORAL | 0 refills | Status: DC

## 2022-01-01 NOTE — ED Triage Notes (Signed)
Pt reports vaginal itching x 2-3 days.

## 2022-01-01 NOTE — ED Provider Notes (Signed)
[place]   MRN: [id number] DOB: 1986-11-02  Subjective:   Mallory Mcdowell is a 36 y.o. female presenting for recurrent vaginal itching.  Has a history of yeast vaginitis and bacterial vaginosis.  Last episode was in January and then had a recurrent BV infection in February.  Has type 2 diabetes treated without insulin and is uncontrolled.  Denies fever, nausea, vomiting, abdominal or pelvic pain, vaginal discharge, genital rash, dysuria.  Patient admits that she actually did not finish her medication for BV as she lost it.  Would like coverage for this as well.  Has an appointment coming up with her new PCP.  Has concerns about her blood pressure medication.  She just got it refilled but has not started it.  Was supposed to be taking lisinopril and hydrochlorothiazide.  Has not taken it in a week.  Reports that she has been checking her blood pressure and has never been elevated.  No current facility-administered medications for this encounter.  Current Outpatient Medications:    Blood Glucose Monitoring Suppl w/Device KIT, 1 each by Does not apply route in the morning, at noon, in the evening, and at bedtime. Check blood sugar 4 times daily, Disp: 1 kit, Rfl: 0   cetirizine (ZYRTEC ALLERGY) 10 MG tablet, Take 1 tablet (10 mg total) by mouth daily., Disp: 30 tablet, Rfl: 0   cyclobenzaprine (FLEXERIL) 10 MG tablet, Take 1 tablet (10 mg total) by mouth 3 (three) times daily as needed for muscle spasms., Disp: 30 tablet, Rfl: 0   fluticasone (FLONASE) 50 MCG/ACT nasal spray, Place 1 spray into both nostrils daily for 14 days., Disp: 16 g, Rfl: 0   lisinopril-hydrochlorothiazide (ZESTORETIC) 20-12.5 MG tablet, Take 1 tablet by mouth daily., Disp: 30 tablet, Rfl: 2   meloxicam (MOBIC) 15 MG tablet, Take 1 tablet (15 mg total) by mouth daily., Disp: 30 tablet, Rfl: 0   metFORMIN (GLUCOPHAGE) 1000 MG tablet, Take 1 tablet (1,000 mg total) by mouth 2 (two) times daily., Disp: 60  tablet, Rfl: 2   metroNIDAZOLE (FLAGYL) 500 MG tablet, Take 1 tablet (500 mg total) by mouth 2 (two) times daily., Disp: 14 tablet, Rfl: 0   ondansetron (ZOFRAN-ODT) 4 MG disintegrating tablet, Take 1 tablet (4 mg total) by mouth every 8 (eight) hours as needed for nausea or vomiting., Disp: 20 tablet, Rfl: 0   Allergies  Allergen Reactions   Amoxicillin Swelling and Anaphylaxis    Past Medical History:  Diagnosis Date   Diabetes mellitus without complication ([place])    Hypertension      No past surgical history on file.  Family History  Problem Relation Age of Onset   Asthma Mother    Diabetes Other    Heart failure Other    Cancer Other     Social History   Tobacco Use   Smoking status: Former    Packs/day: 0.50    Types: Cigarettes   Smokeless tobacco: Never  Vaping Use   Vaping Use: Never used  Substance Use Topics   Alcohol use: Yes    Alcohol/week: 1.0 standard drink    Types: 1 Cans of beer per week    Comment: sometimes daily, but not always   Drug use: Yes    Types: Marijuana    ROS   Objective:   Vitals: BP 138/73 (BP Location: Right Arm)    Pulse 97    Temp 98.2 F (36.8 C) (Oral)    Resp 16    LMP  12/02/2021 (Exact Date)    SpO2 95%   Physical Exam Constitutional:      General: She is not in acute distress.    Appearance: Normal appearance. She is well-developed. She is not ill-appearing, toxic-appearing or diaphoretic.  HENT:     Head: Normocephalic and atraumatic.     Nose: Nose normal.     Mouth/Throat:     Mouth: Mucous membranes are moist.     Pharynx: Oropharynx is clear.  Eyes:     General: No scleral icterus.       Right eye: No discharge.        Left eye: No discharge.     Extraocular Movements: Extraocular movements intact.     Conjunctiva/sclera: Conjunctivae normal.  Cardiovascular:     Rate and Rhythm: Normal rate.  Pulmonary:     Effort: Pulmonary effort is normal.  Abdominal:     General: Bowel sounds are normal. There  is no distension.     Palpations: Abdomen is soft. There is no mass.     Tenderness: There is no abdominal tenderness. There is no right CVA tenderness, left CVA tenderness, guarding or rebound.  Skin:    General: Skin is warm and dry.  Neurological:     General: No focal deficit present.     Mental Status: She is alert and oriented to person, place, and time.     Cranial Nerves: No cranial nerve deficit.     Motor: No weakness.     Coordination: Coordination normal.     Gait: Gait normal.  Psychiatric:        Mood and Affect: Mood normal.        Behavior: Behavior normal.        Thought Content: Thought content normal.        Judgment: Judgment normal.    Assessment and Plan :   PDMP not reviewed this encounter.  1. Acute vaginitis   2. Type 2 diabetes mellitus treated without insulin ([place])   3. Essential hypertension    Recommended covering again for bacterial vaginosis that she did not finish her medication.  We will also cover for yeast vaginitis given her current symptoms and type 2 diabetes that is largely uncontrolled.  We will be using metronidazole and fluconazole for this.  No signs of an acute abdomen, PID, acute encephalopathy related to her blood pressure.  I did review this with her and as she has not taken her blood pressure medication in a week I was agreeable to change her medicine to lisinopril 10 mg once daily.  We will be using this for blood pressure and renal protection.  Emphasized need for close follow-up with her new PCP. Counseled patient on potential for adverse effects with medications prescribed/recommended today, ER and return-to-clinic precautions discussed, patient verbalized understanding.    Jaynee Eagles, PA-C 01/01/22 1231

## 2022-01-01 NOTE — Discharge Instructions (Addendum)
For diabetes or elevated blood sugar, please make sure you are limiting and avoiding starchy, carbohydrate foods like pasta, breads, sweet breads, pastry, rice, potatoes, desserts. These foods can elevate your blood sugar. Also, limit and avoid drinks that contain a lot of sugar such as sodas, sweet teas, fruit juices.  Drinking plain water will be much more helpful, try 64 ounces of water daily.  It is okay to flavor your water naturally by cutting cucumber, lemon, mint or lime, placing it in a picture with water and drinking it over a period of 24-48 hours as long as it remains refrigerated.  For diabetes or elevated blood sugar, please make sure you are limiting and avoiding starchy, carbohydrate foods like pasta, breads, sweet breads, pastry, rice, potatoes, desserts. These foods can elevate your blood sugar. Also, limit and avoid drinks that contain a lot of sugar such as sodas, sweet teas, fruit juices.  Drinking plain water will be much more helpful, try 64 ounces of water daily.  It is okay to flavor your water naturally by cutting cucumber, lemon, mint or lime, placing it in a picture with water and drinking it over a period of 24-48 hours as long as it remains refrigerated.  For elevated blood pressure, make sure you are monitoring salt in your diet.  Do not eat restaurant foods and limit processed foods at home. I highly recommend you prepare and cook your own foods at home.  Processed foods include things like frozen meals, pre-seasoned meats and dinners, deli meats, canned foods as these foods contain a high amount of sodium/salt.  Make sure you are paying attention to sodium labels on foods you buy at the grocery store. Buy your spices separately such as garlic powder, onion powder, cumin, cayenne, parsley flakes so that you can avoid seasonings that contain salt. However, salt-free seasonings are available and can be used, an example is Mrs. Dash and includes a lot of different mixtures that do  not contain salt.  Lastly, when cooking using oils that are healthier for you is important. This includes olive oil, avocado oil, canola oil. We have discussed a lot of foods to avoid but below is a list of foods that can be very healthy to use in your diet whether it is for diabetes, cholesterol, high blood pressure, or in general healthy eating.  Salads - kale, spinach, cabbage, spring mix, arugula Fruits - avocadoes, berries (blueberries, raspberries, blackberries), apples, oranges, pomegranate, grapefruit, kiwi Vegetables - asparagus, cauliflower, broccoli, green beans, brussel sprouts, bell peppers, beets; stay away from or limit starchy vegetables like potatoes, carrots, peas Other general foods - kidney beans, egg whites, almonds, walnuts, sunflower seeds, pumpkin seeds, fat free yogurt, almond milk, flax seeds, quinoa, oats  Meat - It is better to eat lean meats and limit your red meat including pork to once a week.  Wild caught fish, chicken breast are good options as they tend to be leaner sources of good protein. Still be mindful of the sodium labels for the meats you buy.  DO NOT EAT ANY FOODS ON THIS LIST THAT YOU ARE ALLERGIC TO. For more specific needs, I highly recommend consulting a dietician or nutritionist but this can definitely be a good starting point.   For elevated blood pressure, make sure you are monitoring salt in your diet.  Do not eat restaurant foods and limit processed foods at home. I highly recommend you prepare and cook your own foods at home.  Processed foods include things like  frozen meals, pre-seasoned meats and dinners, deli meats, canned foods as these foods contain a high amount of sodium/salt.  Make sure you are paying attention to sodium labels on foods you buy at the grocery store. Buy your spices separately such as garlic powder, onion powder, cumin, cayenne, parsley flakes so that you can avoid seasonings that contain salt. However, salt-free seasonings are  available and can be used, an example is Mrs. Dash and includes a lot of different mixtures that do not contain salt.  Lastly, when cooking using oils that are healthier for you is important. This includes olive oil, avocado oil, canola oil. We have discussed a lot of foods to avoid but below is a list of foods that can be very healthy to use in your diet whether it is for diabetes, cholesterol, high blood pressure, or in general healthy eating.  Salads - kale, spinach, cabbage, spring mix, arugula Fruits - avocadoes, berries (blueberries, raspberries, blackberries), apples, oranges, pomegranate, grapefruit, kiwi Vegetables - asparagus, cauliflower, broccoli, green beans, brussel sprouts, bell peppers, beets; stay away from or limit starchy vegetables like potatoes, carrots, peas Other general foods - kidney beans, egg whites, almonds, walnuts, sunflower seeds, pumpkin seeds, fat free yogurt, almond milk, flax seeds, quinoa, oats  Meat - It is better to eat lean meats and limit your red meat including pork to once a week.  Wild caught fish, chicken breast are good options as they tend to be leaner sources of good protein. Still be mindful of the sodium labels for the meats you buy.  DO NOT EAT ANY FOODS ON THIS LIST THAT YOU ARE ALLERGIC TO. For more specific needs, I highly recommend consulting a dietician or nutritionist but this can definitely be a good starting point.

## 2022-01-05 LAB — CERVICOVAGINAL ANCILLARY ONLY
Bacterial Vaginitis (gardnerella): POSITIVE — AB
Candida Glabrata: NEGATIVE
Candida Vaginitis: POSITIVE — AB
Chlamydia: NEGATIVE
Comment: NEGATIVE
Comment: NEGATIVE
Comment: NEGATIVE
Comment: NEGATIVE
Comment: NEGATIVE
Comment: NORMAL
Neisseria Gonorrhea: NEGATIVE
Trichomonas: NEGATIVE

## 2022-01-09 ENCOUNTER — Ambulatory Visit: Payer: Medicaid Other | Admitting: Internal Medicine

## 2022-01-20 ENCOUNTER — Ambulatory Visit: Payer: Medicaid Other

## 2022-01-20 ENCOUNTER — Ambulatory Visit: Payer: Medicaid Other | Admitting: Internal Medicine

## 2022-01-21 ENCOUNTER — Ambulatory Visit
Admission: RE | Admit: 2022-01-21 | Discharge: 2022-01-21 | Disposition: A | Discharge status: Home / Self Care | Payer: Medicaid Other | Source: Ambulatory Visit | Attending: Family Medicine | Admitting: Family Medicine

## 2022-01-21 ENCOUNTER — Other Ambulatory Visit: Payer: Self-pay

## 2022-01-21 VITALS — BP 134/88 | HR 94 | Temp 99.0°F | Resp 18

## 2022-01-21 DIAGNOSIS — N760 Acute vaginitis: Secondary | ICD-10-CM

## 2022-01-21 MED ORDER — FLUCONAZOLE 150 MG PO TABS: 150.0000 mg | ORAL_TABLET | ORAL | 0 refills | Status: DC

## 2022-01-21 NOTE — ED Triage Notes (Signed)
States she thinks she has a yeast infection.  White vaginal discharge x 1 week.  Also wants to be tested for STDs. ?

## 2022-01-21 NOTE — ED Provider Notes (Signed)
?[facility] ? ? ? ?CSN: [id number] ?Arrival date & time: 01/21/22  1303 ? ? ?  ? ?History   ?Chief Complaint ?No chief complaint on file. ? ? ?HPI ?Mallory Mcdowell is a 36 y.o. female.  ? ?Presenting today with about a week of vaginal itching, irritation, white discharge.  She denies pelvic pain, abdominal pain, nausea vomiting diarrhea, fevers, chills, dysuria, hematuria.  Has not been trying anything over-the-counter for symptoms.  Only new change recently is was using her ex-boyfriend's soap in the shower which is scented.  Requesting STD check as well. ? ? ?Past Medical History:  ?Diagnosis Date  ? Diabetes mellitus without complication ([place])   ? Hypertension   ? ? ?Patient Active Problem List  ? Diagnosis Date Noted  ? Encounter for physical examination, contraception, and Papanicolaou smear 12/24/2016  ? Family planning 12/24/2016  ? Cigarette nicotine dependence without complication 03/47/4259  ? Uncontrolled diabetes mellitus type 2 without complications 56/38/7564  ? Elevated platelet count 05/01/2016  ? Alcohol abuse 05/01/2016  ? ? ?History reviewed. No pertinent surgical history. ? ?OB History   ? ? Gravida  ?0  ? Para  ?0  ? Term  ?0  ? Preterm  ?0  ? AB  ?0  ? Living  ?0  ?  ? ? SAB  ?0  ? IAB  ?0  ? Ectopic  ?0  ? Multiple  ?0  ? Live Births  ?   ?   ?  ?  ? ? ? ?Home Medications   ? ?Prior to Admission medications   ?Medication Sig Start Date End Date Taking? Authorizing Provider  ?Blood Glucose Monitoring Suppl w/Device KIT 1 each by Does not apply route in the morning, at noon, in the evening, and at bedtime. Check blood sugar 4 times daily 09/03/20   Florian Buff, MD  ?cetirizine (ZYRTEC ALLERGY) 10 MG tablet Take 1 tablet (10 mg total) by mouth daily. 09/10/20   Emerson Monte, FNP  ?cyclobenzaprine (FLEXERIL) 10 MG tablet Take 1 tablet (10 mg total) by mouth 3 (three) times daily as needed for muscle spasms. 3/32/95   Delora Fuel, MD  ?fluconazole (DIFLUCAN) 150 MG tablet Take  1 tablet (150 mg total) by mouth once a week. 01/21/22   Volney American, PA-C  ?fluticasone (FLONASE) 50 MCG/ACT nasal spray Place 1 spray into both nostrils daily for 14 days. 09/10/20 09/24/20  Emerson Monte, FNP  ?lisinopril (ZESTRIL) 10 MG tablet Take 1 tablet (10 mg total) by mouth daily. 01/01/22   Jaynee Eagles, PA-C  ?meloxicam (MOBIC) 15 MG tablet Take 1 tablet (15 mg total) by mouth daily. 12/19/20   Faustino Congress, NP  ?metFORMIN (GLUCOPHAGE) 1000 MG tablet Take 1 tablet (1,000 mg total) by mouth 2 (two) times daily. 10/22/21   Vanessa Kick, MD  ?metroNIDAZOLE (FLAGYL) 500 MG tablet Take 1 tablet (500 mg total) by mouth 2 (two) times daily with a meal. DO NOT CONSUME ALCOHOL WHILE TAKING THIS MEDICATION. 01/01/22   Jaynee Eagles, PA-C  ?ondansetron (ZOFRAN-ODT) 4 MG disintegrating tablet Take 1 tablet (4 mg total) by mouth every 8 (eight) hours as needed for nausea or vomiting. 12/16/21   Kommor, Debe Coder, MD  ? ? ?Family History ?Family History  ?Problem Relation Age of Onset  ? Asthma Mother   ? Diabetes Other   ? Heart failure Other   ? Cancer Other   ? ? ?Social History ?Social History  ? ?Tobacco Use  ?  Smoking status: Former  ?  Packs/day: 0.50  ?  Types: Cigarettes  ? Smokeless tobacco: Never  ?Vaping Use  ? Vaping Use: Never used  ?Substance Use Topics  ? Alcohol use: Yes  ?  Alcohol/week: 1.0 standard drink  ?  Types: 1 Cans of beer per week  ?  Comment: sometimes daily, but not always  ? Drug use: Yes  ?  Types: Marijuana  ? ? ? ?Allergies   ?Amoxicillin ? ? ?Review of Systems ?Review of Systems ?Per HPI ? ?Physical Exam ?Triage Vital Signs ?ED Triage Vitals  ?Enc Vitals Group  ?   BP 01/21/22 1316 134/88  ?   Pulse Rate 01/21/22 1316 94  ?   Resp 01/21/22 1316 18  ?   Temp 01/21/22 1316 99 ?F (37.2 ?C)  ?   Temp Source 01/21/22 1316 Oral  ?   SpO2 01/21/22 1316 100 %  ?   Weight --   ?   Height --   ?   Head Circumference --   ?   Peak Flow --   ?   Pain Score 01/21/22 1317 4  ?   Pain  Loc --   ?   Pain Edu? --   ?   Excl. in [place]? --   ? ?No data found. ? ?Updated Vital Signs ?BP 134/88 (BP Location: Right Arm)   Pulse 94   Temp 99 ?F (37.2 ?C) (Oral)   Resp 18   LMP 01/05/2022 (Exact Date)   SpO2 100%  ? ?Visual Acuity ?Right Eye Distance:   ?Left Eye Distance:   ?Bilateral Distance:   ? ?Right Eye Near:   ?Left Eye Near:    ?Bilateral Near:    ? ?Physical Exam ?Vitals and nursing note reviewed.  ?Constitutional:   ?   Appearance: Normal appearance. She is not ill-appearing.  ?HENT:  ?   Head: Atraumatic.  ?Eyes:  ?   Extraocular Movements: Extraocular movements intact.  ?   Conjunctiva/sclera: Conjunctivae normal.  ?Cardiovascular:  ?   Rate and Rhythm: Normal rate and regular rhythm.  ?   Heart sounds: Normal heart sounds.  ?Pulmonary:  ?   Effort: Pulmonary effort is normal.  ?   Breath sounds: Normal breath sounds.  ?Abdominal:  ?   General: Bowel sounds are normal. There is no distension.  ?   Palpations: Abdomen is soft.  ?   Tenderness: There is no abdominal tenderness. There is no right CVA tenderness, left CVA tenderness or guarding.  ?Genitourinary: ?   Comments: GU exam deferred, self swab performed ?Musculoskeletal:     ?   General: Normal range of motion.  ?   Cervical back: Normal range of motion and neck supple.  ?Skin: ?   General: Skin is warm and dry.  ?Neurological:  ?   Mental Status: She is alert and oriented to person, place, and time.  ?Psychiatric:     ?   Mood and Affect: Mood normal.     ?   Thought Content: Thought content normal.     ?   Judgment: Judgment normal.  ? ? ? ?UC Treatments / Results  ?Labs ?(all labs ordered are listed, but only abnormal results are displayed) ?Labs Reviewed  ?CERVICOVAGINAL ANCILLARY ONLY  ? ? ?EKG ? ? ?Radiology ?No results found. ? ?Procedures ?Procedures (including critical care time) ? ?Medications Ordered in UC ?Medications - No data to display ? ?Initial Impression / Assessment and Plan / UC  Course  ?I have reviewed the triage  vital signs and the nursing notes. ? ?Pertinent labs & imaging results that were available during my care of the patient were reviewed by me and considered in my medical decision making (see chart for details). ? ?  ? ?Vaginal swab pending, will treat for yeast vaginitis while awaiting results.  Discussed unscented soaps only, boric acid suppositories as needed and other good vaginal hygiene recommendations.  Treat based on results. ? ?Final Clinical Impressions(s) / UC Diagnoses  ? ?Final diagnoses:  ?Acute vaginitis  ? ?Discharge Instructions   ?None ?  ? ?ED Prescriptions   ? ? Medication Sig Dispense Auth. Provider  ? fluconazole (DIFLUCAN) 150 MG tablet Take 1 tablet (150 mg total) by mouth once a week. 2 tablet Volney American, [place]  ? ?  ? ?PDMP not reviewed this encounter. ?  ?Volney American, PA-C ?01/21/22 1333 ? ?

## 2022-01-22 ENCOUNTER — Telehealth (HOSPITAL_COMMUNITY): Payer: Self-pay | Admitting: Emergency Medicine

## 2022-01-22 LAB — CERVICOVAGINAL ANCILLARY ONLY
Bacterial Vaginitis (gardnerella): POSITIVE — AB
Candida Glabrata: NEGATIVE
Candida Vaginitis: POSITIVE — AB
Chlamydia: NEGATIVE
Comment: NEGATIVE
Comment: NEGATIVE
Comment: NEGATIVE
Comment: NEGATIVE
Comment: NEGATIVE
Comment: NORMAL
Neisseria Gonorrhea: NEGATIVE
Trichomonas: NEGATIVE

## 2022-01-22 MED ORDER — CLINDAMYCIN HCL 150 MG PO CAPS: 300.0000 mg | ORAL_CAPSULE | Freq: Two times a day (BID) | ORAL | 0 refills | Status: AC

## 2022-02-02 ENCOUNTER — Ambulatory Visit: Payer: Medicaid Other | Admitting: Nurse Practitioner

## 2022-02-09 ENCOUNTER — Encounter: Payer: Self-pay | Admitting: Internal Medicine

## 2022-02-09 ENCOUNTER — Ambulatory Visit (INDEPENDENT_AMBULATORY_CARE_PROVIDER_SITE_OTHER): Payer: BC Managed Care – PPO | Admitting: Internal Medicine

## 2022-02-09 VITALS — BP 138/88 | Resp 18 | Ht 65.0 in | Wt 182.2 lb

## 2022-02-09 DIAGNOSIS — Z0000 Encounter for general adult medical examination without abnormal findings: Secondary | ICD-10-CM

## 2022-02-09 DIAGNOSIS — E1169 Type 2 diabetes mellitus with other specified complication: Secondary | ICD-10-CM

## 2022-02-09 DIAGNOSIS — I10 Essential (primary) hypertension: Secondary | ICD-10-CM

## 2022-02-09 DIAGNOSIS — E559 Vitamin D deficiency, unspecified: Secondary | ICD-10-CM

## 2022-02-09 DIAGNOSIS — E119 Type 2 diabetes mellitus without complications: Secondary | ICD-10-CM | POA: Insufficient documentation

## 2022-02-09 DIAGNOSIS — Z1159 Encounter for screening for other viral diseases: Secondary | ICD-10-CM

## 2022-02-09 DIAGNOSIS — Z720 Tobacco use: Secondary | ICD-10-CM | POA: Diagnosis not present

## 2022-02-09 LAB — POCT GLYCOSYLATED HEMOGLOBIN (HGB A1C): HbA1c, POC (controlled diabetic range): 10.7 % — AB (ref 0.0–7.0)

## 2022-02-09 MED ORDER — BLOOD GLUCOSE METER KIT
PACK | 0 refills | Status: DC
Start: 2022-02-09 — End: 2022-07-29

## 2022-02-09 MED ORDER — TIRZEPATIDE 5 MG/0.5ML ~~LOC~~ SOAJ: 5.0000 mg | SUBCUTANEOUS | 0 refills | Status: DC

## 2022-02-09 MED ORDER — TIRZEPATIDE 2.5 MG/0.5ML ~~LOC~~ SOAJ: 2.5000 mg | SUBCUTANEOUS | 0 refills | Status: DC

## 2022-02-09 NOTE — Patient Instructions (Signed)
Please continue taking Metformin and Lisinopril. ? ?Please start taking Mounjaro once weekly as prescribed. Please follow low carb diet and perform moderate exercise/walking at least 150 mins/week. Please have small, frequent meals. ? ?Please get fasting blood tests before the next visit. ?

## 2022-02-12 DIAGNOSIS — I10 Essential (primary) hypertension: Secondary | ICD-10-CM | POA: Insufficient documentation

## 2022-02-12 NOTE — Assessment & Plan Note (Signed)
Lab Results  ?Component Value Date  ? HGBA1C 10.7 (A) 02/09/2022  ? ?Uncontrolled due to noncompliance ?On metformin currently, but has run out of it frequently in the past ?Added Mounjaro for better glycemic control, plan to increase dose as tolerated ?Advised to follow diabetic diet ?On ACEi ?F/u CMP and lipid panel, plan to add statin based on lipid profile ?Diabetic foot exam: Today ?Diabetic eye exam: Advised to follow up with Ophthalmology for diabetic eye exam ?

## 2022-02-12 NOTE — Assessment & Plan Note (Signed)
Smokes about 0.5 pack/day  Asked about quitting: confirms that he/she currently smokes cigarettes Advise to quit smoking: Educated about QUITTING to reduce the risk of cancer, cardio and cerebrovascular disease. Assess willingness: Unwilling to quit at this time, but is working on cutting back. Assist with counseling and pharmacotherapy: Counseled for 5 minutes and literature provided. Arrange for follow up: follow up in 3 months and continue to offer help. 

## 2022-02-12 NOTE — Progress Notes (Signed)
? ?New Patient Office Visit ? ?Subjective:  ?Patient ID: Mallory Mcdowell, female    DOB: October 18, 1986  Age: 36 y.o. MRN: [id number] ? ?CC:  ?Chief Complaint  ?Patient presents with  ? New Patient (Initial Visit)  ?  New patient has not had pcp in awhile   ? ? ?HPI ?Mallory Mcdowell is a 36 y.o. female with past medical history of HTN, type II DM and tobacco abuse who presents for establishing care. ? ?Type II DM: She has history of uncontrolled type II DM, for which she was placed on metformin.  She has been getting metformin from urgent care visit, and has run out of it frequently.  She does not check her blood glucose at home.  Her last HbA1c was 10.8 in 09/22.  She also admits that she needs to improve her diet and cut down soda intake.  She denies any chronic fatigue, polyuria or polydipsia currently. ? ?HTN: She is also on lisinopril currently.  Her BP is well controlled currently.  She denies any headache, dizziness, chest pain, dyspnea or palpitations. ? ?She smokes about 0.5 pack/day, and agrees to cut down slowly. ? ?Past Medical History:  ?Diagnosis Date  ? Diabetes mellitus without complication ([place])   ? Hypertension   ? ? ?History reviewed. No pertinent surgical history. ? ?Family History  ?Problem Relation Age of Onset  ? Asthma Mother   ? Diabetes Other   ? Heart failure Other   ? Cancer Other   ? ? ?Social History  ? ?Socioeconomic History  ? Marital status: Single  ?  Spouse name: Not on file  ? Number of children: Not on file  ? Years of education: Not on file  ? Highest education level: Not on file  ?Occupational History  ? Not on file  ?Tobacco Use  ? Smoking status: Every Day  ?  Packs/day: 0.50  ?  Types: Cigarettes  ? Smokeless tobacco: Never  ?Vaping Use  ? Vaping Use: Never used  ?Substance and Sexual Activity  ? Alcohol use: Yes  ?  Alcohol/week: 1.0 standard drink  ?  Types: 1 Cans of beer per week  ?  Comment: sometimes daily, but not always  ? Drug use: Yes  ?  Types: Marijuana  ? Sexual  activity: Yes  ?  Birth control/protection: None  ?Other Topics Concern  ? Not on file  ?Social History Narrative  ? Not on file  ? ?Social Determinants of Health  ? ?Financial Resource Strain: Not on file  ?Food Insecurity: Not on file  ?Transportation Needs: Not on file  ?Physical Activity: Not on file  ?Stress: Not on file  ?Social Connections: Not on file  ?Intimate Partner Violence: Not on file  ? ? ?ROS ?Review of Systems  ?Constitutional:  Negative for chills and fever.  ?HENT:  Negative for congestion, sinus pressure, sinus pain and sore throat.   ?Eyes:  Negative for pain and discharge.  ?Respiratory:  Negative for cough and shortness of breath.   ?Cardiovascular:  Negative for chest pain and palpitations.  ?Gastrointestinal:  Negative for abdominal pain, constipation, diarrhea, nausea and vomiting.  ?Endocrine: Negative for polydipsia and polyuria.  ?Genitourinary:  Negative for dysuria and hematuria.  ?Musculoskeletal:  Negative for neck pain and neck stiffness.  ?Skin:  Negative for rash.  ?Neurological:  Negative for dizziness and weakness.  ?Psychiatric/Behavioral:  Negative for agitation and behavioral problems.   ? ?Objective:  ? ?Today's Vitals: BP 138/88 (BP Location: Left  Arm, Patient Position: Sitting, Cuff Size: Normal)   Resp 18   Ht _0  (1.651 m)   Wt 182 lb 3.2 oz (82.6 kg)   BMI 30.32 kg/m?  ? ?Physical Exam ?Vitals reviewed.  ?Constitutional:   ?   General: She is not in acute distress. ?   Appearance: She is obese. She is not diaphoretic.  ?HENT:  ?   Head: Normocephalic and atraumatic.  ?   Nose: Nose normal.  ?   Mouth/Throat:  ?   Mouth: Mucous membranes are moist.  ?Eyes:  ?   General: No scleral icterus. ?   Extraocular Movements: Extraocular movements intact.  ?Cardiovascular:  ?   Rate and Rhythm: Normal rate and regular rhythm.  ?   Pulses: Normal pulses.  ?   Heart sounds: Normal heart sounds. No murmur heard. ?Pulmonary:  ?   Breath sounds: Normal breath sounds. No wheezing  or rales.  ?Musculoskeletal:  ?   Cervical back: Neck supple. No tenderness.  ?   Right lower leg: No edema.  ?   Left lower leg: No edema.  ?Skin: ?   General: Skin is warm.  ?   Findings: No rash.  ?Neurological:  ?   General: No focal deficit present.  ?   Mental Status: She is alert and oriented to person, place, and time.  ?   Sensory: No sensory deficit.  ?   Motor: No weakness.  ?Psychiatric:     ?   Mood and Affect: Mood normal.     ?   Behavior: Behavior normal.  ? ? ?Assessment & Plan:  ? ?Problem List Items Addressed This Visit   ? ?  ? Cardiovascular and Mediastinum  ? Essential hypertension  ?  BP Readings from Last 1 Encounters:  ?02/09/22 138/88  ?Well-controlled with lisinopril ?Counseled for compliance with the medications ?Advised DASH diet and moderate exercise/walking, at least 150 mins/week ? ?  ?  ? Relevant Medications  ? lisinopril (ZESTRIL) 10 MG tablet  ?  ? Endocrine  ? Diabetes mellitus ([place]) - Primary  ?  Lab Results  ?Component Value Date  ? HGBA1C 10.7 (A) 02/09/2022  ?Uncontrolled due to noncompliance ?On metformin currently, but has run out of it frequently in the past ?Added Mounjaro for better glycemic control, plan to increase dose as tolerated ?Advised to follow diabetic diet ?On ACEi ?F/u CMP and lipid panel, plan to add statin based on lipid profile ?Diabetic foot exam: Today ?Diabetic eye exam: Advised to follow up with Ophthalmology for diabetic eye exam ?  ?  ? Relevant Medications  ? lisinopril (ZESTRIL) 10 MG tablet  ? tirzepatide [facility]) 2.5 MG/0.5ML Pen  ? tirzepatide [facility]) 5 MG/0.5ML Pen (Start on 03/09/2022)  ? Other Relevant Orders  ? Lipid panel  ? Hemoglobin A1c  ? POCT glycosylated hemoglobin (Hb A1C) (Completed)  ?  ? Other  ? Tobacco abuse  ?  Smokes about 0.5 pack/day ? ?Asked about quitting: confirms that he/she currently smokes cigarettes ?Advise to quit smoking: Educated about QUITTING to reduce the risk of cancer, cardio and cerebrovascular  disease. ?Assess willingness: Unwilling to quit at this time, but is working on cutting back. ?Assist with counseling and pharmacotherapy: Counseled for 5 minutes and literature provided. ?Arrange for follow up: follow up in 3 months and continue to offer help. ?  ?  ? ? Vitamin D deficiency      ? Relevant Orders  ? VITAMIN D 25 Hydroxy (Vit-D  Deficiency, Fractures)  ? Need for hepatitis C screening test      ? Relevant Orders  ? Hepatitis C Antibody  ? ?  ? ? ?Outpatient Encounter Medications as of 02/09/2022  ?Medication Sig  ? blood glucose meter kit and supplies Dispense based on patient and insurance preference. Use up to four times daily as directed. (FOR ICD-10 E10.9, E11.9).  ? Blood Glucose Monitoring Suppl w/Device KIT 1 each by Does not apply route in the morning, at noon, in the evening, and at bedtime. Check blood sugar 4 times daily  ? lisinopril (ZESTRIL) 10 MG tablet Take 10 mg by mouth daily.  ? metFORMIN (GLUCOPHAGE) 1000 MG tablet Take 1 tablet (1,000 mg total) by mouth 2 (two) times daily.  ? tirzepatide [facility]) 2.5 MG/0.5ML Pen Inject 2.5 mg into the skin once a week.  ? [START ON 03/09/2022] tirzepatide (MOUNJARO) 5 MG/0.5ML Pen Inject 5 mg into the skin once a week.  ? [DISCONTINUED] cetirizine (ZYRTEC ALLERGY) 10 MG tablet Take 1 tablet (10 mg total) by mouth daily. (Patient not taking: Reported on 02/09/2022)  ? [DISCONTINUED] cyclobenzaprine (FLEXERIL) 10 MG tablet Take 1 tablet (10 mg total) by mouth 3 (three) times daily as needed for muscle spasms. (Patient not taking: Reported on 02/09/2022)  ? [DISCONTINUED] fluconazole (DIFLUCAN) 150 MG tablet Take 1 tablet (150 mg total) by mouth once a week. (Patient not taking: Reported on 02/09/2022)  ? [DISCONTINUED] fluticasone (FLONASE) 50 MCG/ACT nasal spray Place 1 spray into both nostrils daily for 14 days.  ? [DISCONTINUED] lisinopril (ZESTRIL) 10 MG tablet Take 1 tablet (10 mg total) by mouth daily. (Patient not taking: Reported on 02/09/2022)  ?  [DISCONTINUED] meloxicam (MOBIC) 15 MG tablet Take 1 tablet (15 mg total) by mouth daily. (Patient not taking: Reported on 02/09/2022)  ? [DISCONTINUED] ondansetron (ZOFRAN-ODT) 4 MG disintegrating tablet Take 1 tabl

## 2022-02-12 NOTE — Assessment & Plan Note (Signed)
BP Readings from Last 1 Encounters:  ?02/09/22 138/88  ? ?Well-controlled with lisinopril ?Counseled for compliance with the medications ?Advised DASH diet and moderate exercise/walking, at least 150 mins/week ? ?

## 2022-02-19 ENCOUNTER — Telehealth: Payer: Self-pay | Admitting: Internal Medicine

## 2022-02-19 NOTE — Telephone Encounter (Signed)
Patient aware.

## 2022-02-19 NOTE — Telephone Encounter (Signed)
Patient called din regard to  ?tirzepatide [facility]) 5 MG/0.5ML Pen  ? ?Patient states med cost is $1000 and she cannot afford. ? ?Patient would like a cheaper alternative.  ?Patient would like a call back to discuss. ?

## 2022-03-10 ENCOUNTER — Ambulatory Visit: Payer: BC Managed Care – PPO

## 2022-03-20 ENCOUNTER — Other Ambulatory Visit: Payer: Self-pay | Admitting: *Deleted

## 2022-03-20 MED ORDER — LISINOPRIL 10 MG PO TABS: 10.0000 mg | ORAL_TABLET | Freq: Every day | ORAL | 0 refills | Status: DC

## 2022-03-25 ENCOUNTER — Ambulatory Visit: Payer: Medicaid Other | Admitting: Internal Medicine

## 2022-04-12 ENCOUNTER — Ambulatory Visit: Payer: BC Managed Care – PPO

## 2022-04-14 ENCOUNTER — Ambulatory Visit: Payer: BC Managed Care – PPO

## 2022-04-24 ENCOUNTER — Ambulatory Visit
Admission: RE | Admit: 2022-04-24 | Discharge: 2022-04-24 | Disposition: A | Discharge status: Home / Self Care | Payer: BC Managed Care – PPO | Source: Ambulatory Visit | Attending: Family Medicine | Admitting: Family Medicine

## 2022-04-24 VITALS — BP 131/85 | HR 87 | Temp 98.6°F | Resp 18

## 2022-04-24 DIAGNOSIS — N898 Other specified noninflammatory disorders of vagina: Secondary | ICD-10-CM | POA: Insufficient documentation

## 2022-04-24 LAB — POCT URINALYSIS DIP (MANUAL ENTRY)
Bilirubin, UA: NEGATIVE
Glucose, UA: NEGATIVE mg/dL
Ketones, POC UA: NEGATIVE mg/dL
Leukocytes, UA: NEGATIVE
Nitrite, UA: NEGATIVE
Protein Ur, POC: NEGATIVE mg/dL
Spec Grav, UA: >=1.03 — AB (ref 1.010–1.025)
Urobilinogen, UA: 0.2 E.U./dL
pH, UA: 5 (ref 5.0–8.0)

## 2022-04-24 NOTE — ED Provider Notes (Signed)
[facility]    CSN: [id number] Arrival date & time: 04/24/22  1256      History   Chief Complaint Chief Complaint  Patient presents with   Abdominal Pain    Check for yeast or bacterial - Entered by patient    HPI Mallory Mcdowell is a 36 y.o. female.   Patient presents today for vaginal discharge that started a couple of days ago.  She denies any vaginal itching, odor, rashes, lesions.  She also denies dysuria, urinary frequency, urgency, new incontinence, new back pain, fevers, nausea/vomiting, abdominal pain.  She is sexually active and is requesting STI testing today.  LMP 04/02/22; thinks her menses is starting in the next few days    Past Medical History:  Diagnosis Date   Diabetes mellitus without complication ([place])    Hypertension     Patient Active Problem List   Diagnosis Date Noted   Essential hypertension 02/12/2022   Diabetes mellitus ([place]) 02/09/2022   Tobacco abuse 02/09/2022    History reviewed. No pertinent surgical history.  OB History     Gravida  0   Para  0   Term  0   Preterm  0   AB  0   Living  0      SAB  0   IAB  0   Ectopic  0   Multiple  0   Live Births               Home Medications    Prior to Admission medications   Medication Sig Start Date End Date Taking? Authorizing Provider  blood glucose meter kit and supplies Dispense based on patient and insurance preference. Use up to four times daily as directed. (FOR ICD-10 E10.9, E11.9). 02/09/22   Lindell Spar, MD  Blood Glucose Monitoring Suppl w/Device KIT 1 each by Does not apply route in the morning, at noon, in the evening, and at bedtime. Check blood sugar 4 times daily 09/03/20   Florian Buff, MD  lisinopril (ZESTRIL) 10 MG tablet Take 1 tablet (10 mg total) by mouth daily. 03/20/22   Lindell Spar, MD  metFORMIN (GLUCOPHAGE) 1000 MG tablet Take 1 tablet (1,000 mg total) by mouth 2 (two) times daily. 10/22/21   Vanessa Kick, MD   tirzepatide [facility]) 2.5 MG/0.5ML Pen Inject 2.5 mg into the skin once a week. 02/09/22   Lindell Spar, MD  tirzepatide [facility]) 5 MG/0.5ML Pen Inject 5 mg into the skin once a week. 03/09/22   Lindell Spar, MD    Family History Family History  Problem Relation Age of Onset   Asthma Mother    Diabetes Other    Heart failure Other    Cancer Other     Social History Social History   Tobacco Use   Smoking status: Every Day    Packs/day: 0.50    Types: Cigarettes   Smokeless tobacco: Never  Vaping Use   Vaping Use: Never used  Substance Use Topics   Alcohol use: Yes    Alcohol/week: 1.0 standard drink of alcohol    Types: 1 Cans of beer per week    Comment: sometimes daily, but not always   Drug use: Yes    Types: Marijuana     Allergies   Amoxicillin   Review of Systems Review of Systems Per HPI  Physical Exam Triage Vital Signs ED Triage Vitals  Enc Vitals Group     BP 04/24/22 1314  131/85     Pulse Rate 04/24/22 1314 87     Resp 04/24/22 1314 18     Temp 04/24/22 1314 98.6 F (37 C)     Temp Source 04/24/22 1314 Oral     SpO2 04/24/22 1314 98 %     Weight --      Height --      Head Circumference --      Peak Flow --      Pain Score 04/24/22 1315 0     Pain Loc --      Pain Edu? --      Excl. in [place]? --    No data found.  Updated Vital Signs BP 131/85 (BP Location: Right Arm)   Pulse 87   Temp 98.6 F (37 C) (Oral)   Resp 18   LMP 04/08/2022 (Approximate)   SpO2 98%   Visual Acuity Right Eye Distance:   Left Eye Distance:   Bilateral Distance:    Right Eye Near:   Left Eye Near:    Bilateral Near:     Physical Exam Vitals and nursing note reviewed.  Constitutional:      General: She is not in acute distress.    Appearance: Normal appearance. She is not toxic-appearing.  Pulmonary:     Effort: Pulmonary effort is normal. No respiratory distress.  Abdominal:     General: Abdomen is flat. Bowel sounds are normal.      Palpations: Abdomen is soft.     Tenderness: There is no right CVA tenderness or left CVA tenderness.  Genitourinary:    Comments: Deferred - self swab performed Skin:    General: Skin is warm and dry.     Coloration: Skin is not jaundiced or pale.     Findings: No erythema.  Neurological:     Mental Status: She is alert and oriented to person, place, and time.     Motor: No weakness.     Gait: Gait normal.  Psychiatric:        Mood and Affect: Mood normal.        Behavior: Behavior is cooperative.      UC Treatments / Results  Labs (all labs ordered are listed, but only abnormal results are displayed) Labs Reviewed  POCT URINALYSIS DIP (MANUAL ENTRY) - Abnormal; Notable for the following components:      Result Value   Spec Grav, UA >=1.030 (*)    Blood, UA moderate (*)    All other components within normal limits  HIV ANTIBODY (ROUTINE TESTING W REFLEX)  RPR  CERVICOVAGINAL ANCILLARY ONLY    EKG   Radiology No results found.  Procedures Procedures (including critical care time)  Medications Ordered in UC Medications - No data to display  Initial Impression / Assessment and Plan / UC Course  I have reviewed the triage vital signs and the nursing notes.  Pertinent labs & imaging results that were available during my care of the patient were reviewed by me and considered in my medical decision making (see chart for details).    Urinalysis today does not show signs of infection, specific gravity is elevated and there is a little bit of blood, however her menses is coming up.  No signs of kidney stone on examination today-there is no CVA tenderness or urinary symptoms.  We will check self swab for gonorrhea, chlamydia, trichomonas, yeast vaginitis, bacterial vaginosis and notify/treat with positive results.  Also screen for HIV and syphilis given sexual intercourse.  Encouraged condom use with every sexual counter. Final Clinical Impressions(s) / UC Diagnoses    Final diagnoses:  Vaginal discharge     Discharge Instructions      - The urine sample today does not show any signs of infection -We are testing the vaginal swab for trichomonas, gonorrhea, chlamydia, yeast infection, and bacterial vaginosis and we will call you with positive results - We will call you with any results that are positive from the blood work -Please use condoms with every sexual counter to prevent spread of STI    ED Prescriptions   None    PDMP not reviewed this encounter.   Eulogio Bear, NP 04/24/22 [phone]

## 2022-04-24 NOTE — ED Triage Notes (Signed)
Vaginal discharge x 2 days.  Wants to be checked for VB and wants urine checked.

## 2022-04-24 NOTE — Discharge Instructions (Signed)
-   The urine sample today does not show any signs of infection -We are testing the vaginal swab for trichomonas, gonorrhea, chlamydia, yeast infection, and bacterial vaginosis and we will call you with positive results - We will call you with any results that are positive from the blood work -Please use condoms with every sexual counter to prevent spread of STI

## 2022-04-25 LAB — RPR: RPR Ser Ql: NONREACTIVE

## 2022-04-25 LAB — HIV ANTIBODY (ROUTINE TESTING W REFLEX): HIV Screen 4th Generation wRfx: NONREACTIVE

## 2022-04-27 ENCOUNTER — Telehealth (HOSPITAL_COMMUNITY): Payer: Self-pay | Admitting: Emergency Medicine

## 2022-04-27 LAB — CERVICOVAGINAL ANCILLARY ONLY
Bacterial Vaginitis (gardnerella): POSITIVE — AB
Candida Glabrata: NEGATIVE
Candida Vaginitis: NEGATIVE
Chlamydia: NEGATIVE
Comment: NEGATIVE
Comment: NEGATIVE
Comment: NEGATIVE
Comment: NEGATIVE
Comment: NEGATIVE
Comment: NORMAL
Neisseria Gonorrhea: NEGATIVE
Trichomonas: NEGATIVE

## 2022-04-27 MED ORDER — METRONIDAZOLE 500 MG PO TABS: 500.0000 mg | ORAL_TABLET | Freq: Two times a day (BID) | ORAL | 0 refills | Status: DC

## 2022-04-30 ENCOUNTER — Other Ambulatory Visit: Payer: Self-pay | Admitting: Internal Medicine

## 2022-05-08 DIAGNOSIS — Z7984 Long term (current) use of oral hypoglycemic drugs: Secondary | ICD-10-CM | POA: Insufficient documentation

## 2022-05-08 DIAGNOSIS — E1165 Type 2 diabetes mellitus with hyperglycemia: Secondary | ICD-10-CM | POA: Insufficient documentation

## 2022-05-09 ENCOUNTER — Emergency Department (HOSPITAL_COMMUNITY)
Admission: EM | Admit: 2022-05-09 | Discharge: 2022-05-09 | Disposition: A | Discharge status: Home / Self Care | Payer: BC Managed Care – PPO | Attending: Emergency Medicine | Admitting: Emergency Medicine

## 2022-05-09 ENCOUNTER — Encounter (HOSPITAL_COMMUNITY): Payer: Self-pay

## 2022-05-09 DIAGNOSIS — R739 Hyperglycemia, unspecified: Secondary | ICD-10-CM

## 2022-05-09 LAB — CBG MONITORING, ED: Glucose-Capillary: 283 mg/dL — ABNORMAL HIGH (ref 70–99)

## 2022-05-09 MED ORDER — INSULIN ASPART 100 UNIT/ML IJ SOLN
10.0000 [IU] | Freq: Once | INTRAMUSCULAR | Status: AC
  Administered 2022-05-09: 10 [IU] via SUBCUTANEOUS
  Filled 2022-05-09: qty 1

## 2022-05-09 MED ORDER — METFORMIN HCL 1000 MG PO TABS: 1000.0000 mg | ORAL_TABLET | Freq: Two times a day (BID) | ORAL | 2 refills | Status: DC

## 2022-05-09 NOTE — ED Triage Notes (Signed)
Pt presents to ED from work with c/o high blood sugar, pt says she hasn't checked her BS but knows it's high b/c her vision has been getting blurry, pt has been out of metformin for about a week, last time taken was last week, pt needs new Rx. Pt has been drinking lots of water to bring sugar down.

## 2022-05-09 NOTE — ED Provider Notes (Signed)
[facility] EMERGENCY DEPARTMENT Provider Note   CSN: [id number] Arrival date & time: 05/08/22  2328     History  Chief Complaint  Patient presents with   Hyperglycemia    Mallory Mcdowell is a 36 y.o. female.  Patient presents to the emergency department for evaluation of elevated blood sugar.  Patient is a diabetic, has run out of her metformin.  She feels well otherwise.  Patient is establishing care with a new doctor, has not been seen yet.       Home Medications Prior to Admission medications   Medication Sig Start Date End Date Taking? Authorizing Provider  blood glucose meter kit and supplies Dispense based on patient and insurance preference. Use up to four times daily as directed. (FOR ICD-10 E10.9, E11.9). 02/09/22   Lindell Spar, MD  Blood Glucose Monitoring Suppl w/Device KIT 1 each by Does not apply route in the morning, at noon, in the evening, and at bedtime. Check blood sugar 4 times daily 09/03/20   Florian Buff, MD  lisinopril (ZESTRIL) 10 MG tablet Take 1 tablet by mouth once daily 04/30/22   Lindell Spar, MD  metFORMIN (GLUCOPHAGE) 1000 MG tablet Take 1 tablet (1,000 mg total) by mouth 2 (two) times daily. 05/09/22   Orpah Greek, MD  metroNIDAZOLE (FLAGYL) 500 MG tablet Take 1 tablet (500 mg total) by mouth 2 (two) times daily. 04/27/22   Chase Picket, MD  tirzepatide [facility]) 2.5 MG/0.5ML Pen Inject 2.5 mg into the skin once a week. 02/09/22   Lindell Spar, MD  tirzepatide [facility]) 5 MG/0.5ML Pen Inject 5 mg into the skin once a week. 03/09/22   Lindell Spar, MD      Allergies    Amoxicillin    Review of Systems   Review of Systems  Physical Exam Updated Vital Signs BP 115/81   Pulse 83   Temp 98.3 F (36.8 C) (Oral)   Resp 18   LMP 04/08/2022 (Approximate)   SpO2 100%  Physical Exam Vitals and nursing note reviewed.  Constitutional:      General: She is not in acute distress.    Appearance: She is well-developed.   HENT:     Head: Normocephalic and atraumatic.     Mouth/Throat:     Mouth: Mucous membranes are moist.  Eyes:     General: Vision grossly intact. Gaze aligned appropriately.     Extraocular Movements: Extraocular movements intact.     Conjunctiva/sclera: Conjunctivae normal.  Cardiovascular:     Rate and Rhythm: Normal rate and regular rhythm.     Pulses: Normal pulses.     Heart sounds: Normal heart sounds, S1 normal and S2 normal. No murmur heard.    No friction rub. No gallop.  Pulmonary:     Effort: Pulmonary effort is normal. No respiratory distress.     Breath sounds: Normal breath sounds.  Abdominal:     General: Bowel sounds are normal.     Palpations: Abdomen is soft.     Tenderness: There is no abdominal tenderness. There is no guarding or rebound.     Hernia: No hernia is present.  Musculoskeletal:        General: No swelling.     Cervical back: Full passive range of motion without pain, normal range of motion and neck supple. No spinous process tenderness or muscular tenderness. Normal range of motion.     Right lower leg: No edema.  Left lower leg: No edema.  Skin:    General: Skin is warm and dry.     Capillary Refill: Capillary refill takes less than 2 seconds.     Findings: No ecchymosis, erythema, rash or wound.  Neurological:     General: No focal deficit present.     Mental Status: She is alert and oriented to person, place, and time.     GCS: GCS eye subscore is 4. GCS verbal subscore is 5. GCS motor subscore is 6.     Cranial Nerves: Cranial nerves 2-12 are intact.     Sensory: Sensation is intact.     Motor: Motor function is intact.     Coordination: Coordination is intact.  Psychiatric:        Attention and Perception: Attention normal.        Mood and Affect: Mood normal.        Speech: Speech normal.        Behavior: Behavior normal.     ED Results / Procedures / Treatments   Labs (all labs ordered are listed, but only abnormal results  are displayed) Labs Reviewed  CBG MONITORING, ED - Abnormal; Notable for the following components:      Result Value   Glucose-Capillary 283 (*)    All other components within normal limits    EKG None  Radiology No results found.  Procedures Procedures    Medications Ordered in ED Medications  insulin aspart (novoLOG) injection 10 Units (has no administration in time range)    ED Course/ Medical Decision Making/ A&P                           Medical Decision Making  Patient presents to the emergency department for evaluation of elevated blood sugar.  Patient has a history of type 2 diabetes and has been out of her metformin for more than a week.  She appears well, vital signs are stable.  Blood sugar is 283, no concern for DKA at this time.  We will administer subcu insulin and restart her medication.  She has follow-up with primary care pending.        Final Clinical Impression(s) / ED Diagnoses Final diagnoses:  Hyperglycemia    Rx / DC Orders ED Discharge Orders          Ordered    metFORMIN (GLUCOPHAGE) 1000 MG tablet  2 times daily        05/09/22 0031              Orpah Greek, MD 05/09/22 [phone]

## 2022-05-11 ENCOUNTER — Ambulatory Visit: Payer: BC Managed Care – PPO | Admitting: Internal Medicine

## 2022-05-31 ENCOUNTER — Ambulatory Visit: Payer: Medicaid Other

## 2022-05-31 ENCOUNTER — Ambulatory Visit
Admission: RE | Admit: 2022-05-31 | Discharge: 2022-05-31 | Disposition: A | Discharge status: Home / Self Care | Payer: Medicaid Other | Source: Ambulatory Visit | Attending: Nurse Practitioner | Admitting: Nurse Practitioner

## 2022-05-31 VITALS — BP 134/85 | HR 108 | Temp 98.4°F | Resp 18

## 2022-05-31 DIAGNOSIS — N898 Other specified noninflammatory disorders of vagina: Secondary | ICD-10-CM

## 2022-05-31 NOTE — ED Provider Notes (Signed)
[facility]    CSN: [id number] Arrival date & time: 05/31/22  1244      History   Chief Complaint Chief Complaint  Patient presents with   Vaginal Itching    Entered by patient    HPI Mallory Mcdowell is a 36 y.o. female.   The history is provided by the patient.   Patient presents today for vaginal odor that started a couple of days ago.  She describes the odor as "something just does not smell right".  She denies any vaginal itching, vaginal discharge, rashes, lesions.  She also denies dysuria, urinary frequency, urgency, new incontinence, new back pain, fevers, nausea/vomiting, abdominal pain.  LMP 05/21/22.  Patient is requesting recheck to ensure she no longer has bacterial vaginosis or yeast infection.  Denies any new sexual partners.  She would like STI testing today as well.  Past Medical History:  Diagnosis Date   Diabetes mellitus without complication ([place])    Hypertension     Patient Active Problem List   Diagnosis Date Noted   Essential hypertension 02/12/2022   Diabetes mellitus ([place]) 02/09/2022   Tobacco abuse 02/09/2022    History reviewed. No pertinent surgical history.  OB History     Gravida  0   Para  0   Term  0   Preterm  0   AB  0   Living  0      SAB  0   IAB  0   Ectopic  0   Multiple  0   Live Births               Home Medications    Prior to Admission medications   Medication Sig Start Date End Date Taking? Authorizing Provider  blood glucose meter kit and supplies Dispense based on patient and insurance preference. Use up to four times daily as directed. (FOR ICD-10 E10.9, E11.9). 02/09/22   Lindell Spar, MD  Blood Glucose Monitoring Suppl w/Device KIT 1 each by Does not apply route in the morning, at noon, in the evening, and at bedtime. Check blood sugar 4 times daily 09/03/20   Florian Buff, MD  lisinopril (ZESTRIL) 10 MG tablet Take 1 tablet by mouth once daily 04/30/22   Lindell Spar, MD   metFORMIN (GLUCOPHAGE) 1000 MG tablet Take 1 tablet (1,000 mg total) by mouth 2 (two) times daily. 05/09/22   Orpah Greek, MD  metroNIDAZOLE (FLAGYL) 500 MG tablet Take 1 tablet (500 mg total) by mouth 2 (two) times daily. 04/27/22   Chase Picket, MD  tirzepatide [facility]) 2.5 MG/0.5ML Pen Inject 2.5 mg into the skin once a week. 02/09/22   Lindell Spar, MD  tirzepatide [facility]) 5 MG/0.5ML Pen Inject 5 mg into the skin once a week. 03/09/22   Lindell Spar, MD    Family History Family History  Problem Relation Age of Onset   Asthma Mother    Diabetes Other    Heart failure Other    Cancer Other     Social History Social History   Tobacco Use   Smoking status: Every Day    Packs/day: 0.50    Types: Cigarettes   Smokeless tobacco: Never  Vaping Use   Vaping Use: Never used  Substance Use Topics   Alcohol use: Yes    Alcohol/week: 1.0 standard drink of alcohol    Types: 1 Cans of beer per week    Comment: sometimes daily, but not always  Drug use: Yes    Types: Marijuana     Allergies   Amoxicillin   Review of Systems Review of Systems Per HPI  Physical Exam Triage Vital Signs ED Triage Vitals  Enc Vitals Group     BP 05/31/22 1249 134/85     Pulse Rate 05/31/22 1249 (!) 108     Resp 05/31/22 1249 18     Temp 05/31/22 1249 98.4 F (36.9 C)     Temp Source 05/31/22 1249 Oral     SpO2 05/31/22 1249 97 %     Weight --      Height --      Head Circumference --      Peak Flow --      Pain Score 05/31/22 1252 0     Pain Loc --      Pain Edu? --      Excl. in [place]? --    No data found.  Updated Vital Signs BP 134/85 (BP Location: Right Arm)   Pulse (!) 108   Temp 98.4 F (36.9 C) (Oral)   Resp 18   LMP 05/21/2022   SpO2 97%   Visual Acuity Right Eye Distance:   Left Eye Distance:   Bilateral Distance:    Right Eye Near:   Left Eye Near:    Bilateral Near:     Physical Exam Vitals and nursing note reviewed.  Constitutional:       Appearance: Normal appearance. She is not toxic-appearing.  HENT:     Head: Normocephalic.  Eyes:     Extraocular Movements: Extraocular movements intact.     Pupils: Pupils are equal, round, and reactive to light.  Cardiovascular:     Rate and Rhythm: Normal rate and regular rhythm.     Pulses: Normal pulses.     Heart sounds: Normal heart sounds.  Pulmonary:     Effort: Pulmonary effort is normal.     Breath sounds: Normal breath sounds.  Abdominal:     General: Bowel sounds are normal.     Palpations: Abdomen is soft.     Tenderness: There is no right CVA tenderness or left CVA tenderness.  Musculoskeletal:     Cervical back: Normal range of motion.  Skin:    General: Skin is warm and dry.  Neurological:     General: No focal deficit present.     Mental Status: She is alert and oriented to person, place, and time.  Psychiatric:        Mood and Affect: Mood normal.        Behavior: Behavior normal.      UC Treatments / Results  Labs (all labs ordered are listed, but only abnormal results are displayed) Labs Reviewed  CERVICOVAGINAL ANCILLARY ONLY    EKG   Radiology No results found.  Procedures Procedures (including critical care time)  Medications Ordered in UC Medications - No data to display  Initial Impression / Assessment and Plan / UC Course  I have reviewed the triage vital signs and the nursing notes.  Pertinent labs & imaging results that were available during my care of the patient were reviewed by me and considered in my medical decision making (see chart for details).  Patient is presenting for recheck for BV.  States that she thinks she has a continued vaginal odor.  She was recently treated for BV mid June.  She denies any other vaginal symptoms.  We will await cytology results at this time.  Patient  encouraged to use condoms with each sexual encounter.  Patient advised to follow-up as needed.  Final Clinical Impressions(s) / UC Diagnoses    Final diagnoses:  Vaginal odor     Discharge Instructions      Your cytology results are pending.  As discussed, should be available within the next 48 to 72 hours.  If you have access to MyChart, you will be able to see the results there as well.  If your results are positive, you will be contacted and provided treatment. Recommend condom use with each sexual encounter to prevent STI. Follow-up as needed.    ED Prescriptions   None    PDMP not reviewed this encounter.   Tish Men, NP 05/31/22 1306

## 2022-05-31 NOTE — Discharge Instructions (Addendum)
Your cytology results are pending.  As discussed, should be available within the next 48 to 72 hours.  If you have access to MyChart, you will be able to see the results there as well.  If your results are positive, you will be contacted and provided treatment. Recommend condom use with each sexual encounter to prevent STI. Follow-up as needed.

## 2022-05-31 NOTE — ED Triage Notes (Signed)
Vaginal odor, hx of BV.  Wants to make sure BV has cleared up.

## 2022-06-01 ENCOUNTER — Telehealth (HOSPITAL_COMMUNITY): Payer: Self-pay | Admitting: Emergency Medicine

## 2022-06-01 LAB — CERVICOVAGINAL ANCILLARY ONLY
Bacterial Vaginitis (gardnerella): POSITIVE — AB
Candida Glabrata: NEGATIVE
Candida Vaginitis: NEGATIVE
Chlamydia: NEGATIVE
Comment: NEGATIVE
Comment: NEGATIVE
Comment: NEGATIVE
Comment: NEGATIVE
Comment: NEGATIVE
Comment: NORMAL
Neisseria Gonorrhea: NEGATIVE
Trichomonas: NEGATIVE

## 2022-06-01 MED ORDER — METRONIDAZOLE 500 MG PO TABS: 500.0000 mg | ORAL_TABLET | Freq: Two times a day (BID) | ORAL | 0 refills | Status: DC

## 2022-07-13 ENCOUNTER — Ambulatory Visit: Payer: Medicaid Other

## 2022-07-15 ENCOUNTER — Ambulatory Visit
Admission: RE | Admit: 2022-07-15 | Discharge: 2022-07-15 | Discharge status: Against Medical Advice | Payer: Medicaid Other | Source: Ambulatory Visit | Attending: Internal Medicine | Admitting: Internal Medicine

## 2022-07-15 NOTE — ED Notes (Signed)
Called pts phone and phone call was sent to VM, unable to leave VM due to VM being full

## 2022-07-15 NOTE — ED Notes (Signed)
Attempted to call patient, no answer 

## 2022-07-15 NOTE — ED Notes (Signed)
Called in lobby no answered. Number in chart not working.

## 2022-07-19 ENCOUNTER — Ambulatory Visit: Payer: Medicaid Other

## 2022-07-23 ENCOUNTER — Encounter: Payer: Self-pay | Admitting: Emergency Medicine

## 2022-07-23 ENCOUNTER — Ambulatory Visit: Payer: Medicaid Other

## 2022-07-23 ENCOUNTER — Other Ambulatory Visit: Payer: Self-pay

## 2022-07-23 ENCOUNTER — Ambulatory Visit
Admission: EM | Admit: 2022-07-23 | Discharge: 2022-07-23 | Disposition: A | Discharge status: Home / Self Care | Payer: 59 | Attending: Nurse Practitioner | Admitting: Nurse Practitioner

## 2022-07-23 DIAGNOSIS — R350 Frequency of micturition: Secondary | ICD-10-CM | POA: Diagnosis present

## 2022-07-23 LAB — POCT URINALYSIS DIP (MANUAL ENTRY)
Bilirubin, UA: NEGATIVE
Blood, UA: NEGATIVE
Glucose, UA: 500 mg/dL — AB
Ketones, POC UA: NEGATIVE mg/dL
Leukocytes, UA: NEGATIVE
Nitrite, UA: NEGATIVE
Protein Ur, POC: NEGATIVE mg/dL
Spec Grav, UA: >=1.03 — AB (ref 1.010–1.025)
Urobilinogen, UA: 0.2 E.U./dL
pH, UA: 6 (ref 5.0–8.0)

## 2022-07-23 NOTE — ED Provider Notes (Signed)
[facility]    CSN: [id number] Arrival date & time: 07/23/22  1341      History   Chief Complaint Chief Complaint  Patient presents with   Vaginal Itching    HPI Mallory Mcdowell is a 36 y.o. female.   Patient presents with urinary frequency today.  She denies dysuria, blood in her urine, urgency, new incontinence.  Reports "I want to be checked for bacterial vaginosis and a yeast infection."  She denies any vaginal discharge, irritation, open sores, lesions, or rashes on her genitalia.  Also denies abdominal pain, nausea/vomiting, fever.  Reports a history of bacterial vaginosis.  Reports that she just finished her period.    Past Medical History:  Diagnosis Date   Diabetes mellitus without complication ([place])    Hypertension     Patient Active Problem List   Diagnosis Date Noted   Essential hypertension 02/12/2022   Diabetes mellitus ([place]) 02/09/2022   Tobacco abuse 02/09/2022    History reviewed. No pertinent surgical history.  OB History     Gravida  0   Para  0   Term  0   Preterm  0   AB  0   Living  0      SAB  0   IAB  0   Ectopic  0   Multiple  0   Live Births               Home Medications    Prior to Admission medications   Medication Sig Start Date End Date Taking? Authorizing Provider  blood glucose meter kit and supplies Dispense based on patient and insurance preference. Use up to four times daily as directed. (FOR ICD-10 E10.9, E11.9). 02/09/22   Lindell Spar, MD  Blood Glucose Monitoring Suppl w/Device KIT 1 each by Does not apply route in the morning, at noon, in the evening, and at bedtime. Check blood sugar 4 times daily 09/03/20   Florian Buff, MD  lisinopril (ZESTRIL) 10 MG tablet Take 1 tablet by mouth once daily 04/30/22   Lindell Spar, MD  metFORMIN (GLUCOPHAGE) 1000 MG tablet Take 1 tablet (1,000 mg total) by mouth 2 (two) times daily. 05/09/22   Orpah Greek, MD  tirzepatide [facility])  2.5 MG/0.5ML Pen Inject 2.5 mg into the skin once a week. 02/09/22   Lindell Spar, MD  tirzepatide [facility]) 5 MG/0.5ML Pen Inject 5 mg into the skin once a week. Patient not taking: Reported on 07/23/2022 03/09/22   Lindell Spar, MD    Family History Family History  Problem Relation Age of Onset   Asthma Mother    Diabetes Other    Heart failure Other    Cancer Other     Social History Social History   Tobacco Use   Smoking status: Every Day    Packs/day: 0.50    Types: Cigarettes   Smokeless tobacco: Never  Vaping Use   Vaping Use: Never used  Substance Use Topics   Alcohol use: Yes    Alcohol/week: 1.0 standard drink of alcohol    Types: 1 Cans of beer per week    Comment: sometimes daily, but not always   Drug use: Yes    Types: Marijuana     Allergies   Amoxicillin   Review of Systems Review of Systems Per HPI  Physical Exam Triage Vital Signs ED Triage Vitals  Enc Vitals Group     BP 07/23/22 1352 128/83  Pulse Rate 07/23/22 1352 91     Resp 07/23/22 1352 20     Temp 07/23/22 1352 98.2 F (36.8 C)     Temp Source 07/23/22 1352 Oral     SpO2 07/23/22 1352 97 %     Weight --      Height --      Head Circumference --      Peak Flow --      Pain Score 07/23/22 1353 0     Pain Loc --      Pain Edu? --      Excl. in [place]? --    No data found.  Updated Vital Signs BP 128/83 (BP Location: Right Arm)   Pulse 91   Temp 98.2 F (36.8 C) (Oral)   Resp 20   LMP 07/17/2022 (Approximate)   SpO2 97%   Visual Acuity Right Eye Distance:   Left Eye Distance:   Bilateral Distance:    Right Eye Near:   Left Eye Near:    Bilateral Near:     Physical Exam Vitals and nursing note reviewed.  Constitutional:      General: She is not in acute distress.    Appearance: Normal appearance. She is not toxic-appearing.  Pulmonary:     Effort: Pulmonary effort is normal. No respiratory distress.  Genitourinary:    Comments: Deferred-self swab  performed Skin:    General: Skin is warm and dry.     Coloration: Skin is not jaundiced or pale.     Findings: No erythema.  Neurological:     Mental Status: She is alert and oriented to person, place, and time.     Motor: No weakness.     Gait: Gait normal.  Psychiatric:        Mood and Affect: Mood normal.        Behavior: Behavior is cooperative.      UC Treatments / Results  Labs (all labs ordered are listed, but only abnormal results are displayed) Labs Reviewed  POCT URINALYSIS DIP (MANUAL ENTRY) - Abnormal; Notable for the following components:      Result Value   Glucose, UA =500 (*)    Spec Grav, UA >=1.030 (*)    All other components within normal limits  CERVICOVAGINAL ANCILLARY ONLY    EKG   Radiology No results found.  Procedures Procedures (including critical care time)  Medications Ordered in UC Medications - No data to display  Initial Impression / Assessment and Plan / UC Course  I have reviewed the triage vital signs and the nursing notes.  Pertinent labs & imaging results that were available during my care of the patient were reviewed by me and considered in my medical decision making (see chart for details).    Patient is well-appearing, normotensive, afebrile, not tachycardic, not tachypneic, oxygenating well on room air.  Urinalysis today negative for signs of infection.  She has a known history of diabetes, which explains the glucose.  Self swab obtained to check for yeast vaginitis, bacterial vaginosis, gonorrhea, chlamydia, and trichomonas.  HIV and syphilis testing declined by patient.  Discussed with patient that we will hold off on treating for any vaginitis today until the results come back since she is not having symptoms of vaginitis.  The patient was given the opportunity to ask questions.  All questions answered to their satisfaction.  The patient is in agreement to this plan.   Final Clinical Impressions(s) / UC Diagnoses   Final  diagnoses:  Urinary frequency     Discharge Instructions      - The urine sample today does not show signs of infection.  - we will call you with positive results from the vaginal swab and will prescribe treatment when we have the results      ED Prescriptions   None    PDMP not reviewed this encounter.   Eulogio Bear, NP 07/23/22 1443

## 2022-07-23 NOTE — Discharge Instructions (Signed)
-   The urine sample today does not show signs of infection.  - we will call you with positive results from the vaginal swab and will prescribe treatment when we have the results

## 2022-07-23 NOTE — ED Triage Notes (Signed)
Pt reports vaginal itching and urinary frequency x1.5 weeks.denies fever,abd pain.

## 2022-07-24 LAB — CERVICOVAGINAL ANCILLARY ONLY
Bacterial Vaginitis (gardnerella): POSITIVE — AB
Candida Glabrata: NEGATIVE
Candida Vaginitis: NEGATIVE
Chlamydia: NEGATIVE
Comment: NEGATIVE
Comment: NEGATIVE
Comment: NEGATIVE
Comment: NEGATIVE
Comment: NEGATIVE
Comment: NORMAL
Neisseria Gonorrhea: NEGATIVE
Trichomonas: NEGATIVE

## 2022-07-27 ENCOUNTER — Telehealth (HOSPITAL_COMMUNITY): Payer: Self-pay | Admitting: Emergency Medicine

## 2022-07-27 MED ORDER — METRONIDAZOLE 500 MG PO TABS: 500.0000 mg | ORAL_TABLET | Freq: Two times a day (BID) | ORAL | 0 refills | Status: DC

## 2022-07-29 ENCOUNTER — Ambulatory Visit (INDEPENDENT_AMBULATORY_CARE_PROVIDER_SITE_OTHER): Payer: 59 | Admitting: Internal Medicine

## 2022-07-29 ENCOUNTER — Encounter: Payer: Self-pay | Admitting: Internal Medicine

## 2022-07-29 VITALS — BP 124/86 | Resp 16 | Ht 65.0 in | Wt 186.4 lb

## 2022-07-29 DIAGNOSIS — E1169 Type 2 diabetes mellitus with other specified complication: Secondary | ICD-10-CM | POA: Diagnosis not present

## 2022-07-29 DIAGNOSIS — D259 Leiomyoma of uterus, unspecified: Secondary | ICD-10-CM | POA: Diagnosis not present

## 2022-07-29 DIAGNOSIS — Z2821 Immunization not carried out because of patient refusal: Secondary | ICD-10-CM

## 2022-07-29 DIAGNOSIS — E669 Obesity, unspecified: Secondary | ICD-10-CM

## 2022-07-29 DIAGNOSIS — E782 Mixed hyperlipidemia: Secondary | ICD-10-CM | POA: Diagnosis not present

## 2022-07-29 DIAGNOSIS — I10 Essential (primary) hypertension: Secondary | ICD-10-CM | POA: Diagnosis not present

## 2022-07-29 DIAGNOSIS — D219 Benign neoplasm of connective and other soft tissue, unspecified: Secondary | ICD-10-CM | POA: Insufficient documentation

## 2022-07-29 MED ORDER — TRULICITY 0.75 MG/0.5ML ~~LOC~~ SOAJ: 0.7500 mg | SUBCUTANEOUS | 0 refills | Status: DC

## 2022-07-29 MED ORDER — GLIPIZIDE 5 MG PO TABS: 5.0000 mg | ORAL_TABLET | Freq: Two times a day (BID) | ORAL | 3 refills | Status: DC

## 2022-07-29 NOTE — Assessment & Plan Note (Signed)
Lab Results  Component Value Date   HGBA1C 10.7 (A) 02/09/2022   Uncontrolled due to noncompliance On metformin currently, but has diarrhea with it DC metformin and start glipizide 5 mg twice daily Added Trulicity for better glycemic control, plan to increase dose as tolerated Advised to follow diabetic diet On ACEi F/u CMP and lipid panel, plan to add statin based on lipid profile Diabetic eye exam: Advised to follow up with Ophthalmology for diabetic eye exam

## 2022-07-29 NOTE — Assessment & Plan Note (Addendum)
Advised to follow low carb diet and perform moderate exercise/walking at least 150 mins/week Started Trulicity for type 2 DM

## 2022-07-29 NOTE — Assessment & Plan Note (Signed)
BP Readings from Last 1 Encounters:  07/29/22 124/86   Well-controlled with lisinopril Counseled for compliance with the medications Advised DASH diet and moderate exercise/walking, at least 150 mins/week

## 2022-07-29 NOTE — Assessment & Plan Note (Signed)
Needs to f/u with Ob/Gyn. Was told that she needs to get her type II DM under control to be considered for procedure

## 2022-07-29 NOTE — Patient Instructions (Signed)
Please start taking Glipizide as prescribed.  Please start taking Trulicity as prescribed.  Please follow small, frequent meals and avoid skipping meals.

## 2022-07-29 NOTE — Progress Notes (Signed)
Established Patient Office Visit  Subjective:  Patient ID: Mallory Mcdowell, female    DOB: 07-Apr-1986  Age: 36 y.o. MRN: [id number]  CC:  Chief Complaint  Patient presents with   Follow-up    Follow up wants to discuss surgery for uterine fibroids cannot afford mounjaro and needs to get sugar down first      HPI Mallory Mcdowell is a 36 y.o. female with past medical history of HTN, type II DM and tobacco abuse who presents for f/u of her chronic medical conditions.  Type II DM: She has history of uncontrolled type II DM, for which she was placed on metformin and Mounjaro.  She was not able to get [facility] due to cost concern/insurance coverage concern.  She has not been taking metformin regularly as she has diarrhea with it. She does not check her blood glucose at home.  Her last HbA1c was 10.7 in 04/23.  She also admits that she needs to improve her diet and cut down soda intake.  She denies any chronic fatigue, polyuria or polydipsia currently.  HTN: She is also on lisinopril currently.  Her BP is well controlled currently.  She denies any headache, dizziness, chest pain, dyspnea or palpitations.  Past Medical History:  Diagnosis Date   Diabetes mellitus without complication ([place])    Hypertension     History reviewed. No pertinent surgical history.  Family History  Problem Relation Age of Onset   Asthma Mother    Diabetes Other    Heart failure Other    Cancer Other     Social History   Socioeconomic History   Marital status: Single    Spouse name: Not on file   Number of children: Not on file   Years of education: Not on file   Highest education level: Not on file  Occupational History   Not on file  Tobacco Use   Smoking status: Every Day    Packs/day: 0.50    Types: Cigarettes   Smokeless tobacco: Never  Vaping Use   Vaping Use: Never used  Substance and Sexual Activity   Alcohol use: Yes    Alcohol/week: 1.0 standard drink of alcohol    Types: 1 Cans  of beer per week    Comment: sometimes daily, but not always   Drug use: Yes    Types: Marijuana   Sexual activity: Yes    Birth control/protection: None  Other Topics Concern   Not on file  Social History Narrative   Not on file   Social Determinants of Health   Financial Resource Strain: Not on file  Food Insecurity: Food Insecurity Present (05/08/2020)   Hunger Vital Sign    Worried About Running Out of Food in the Last Year: Sometimes true    Ran Out of Food in the Last Year: Sometimes true  Transportation Needs: No Transportation Needs (05/08/2020)   PRAPARE - Hydrologist (Medical): No    Lack of Transportation (Non-Medical): No  Physical Activity: Insufficiently Active (05/08/2020)   Exercise Vital Sign    Days of Exercise per Week: 3 days    Minutes of Exercise per Session: 10 min  Stress: Stress Concern Present (05/08/2020)   [place]    Feeling of Stress : Rather much  Social Connections: Moderately Integrated (05/08/2020)   Social Connection and Isolation Panel [NHANES]    Frequency of Communication with Friends and Family: More than  three times a week    Frequency of Social Gatherings with Friends and Family: More than three times a week    Attends Religious Services: 1 to 4 times per year    Active Member of [employer] or Organizations: Yes    Attends Archivist Meetings: 1 to 4 times per year    Marital Status: Never married  Intimate Partner Violence: Not At Risk (05/08/2020)   Humiliation, Afraid, Rape, and Kick questionnaire    Fear of Current or Ex-Partner: No    Emotionally Abused: No    Physically Abused: No    Sexually Abused: No    Outpatient Medications Prior to Visit  Medication Sig Dispense Refill   Blood Glucose Monitoring Suppl w/Device KIT 1 each by Does not apply route in the morning, at noon, in the evening, and at bedtime. Check blood sugar 4  times daily 1 kit 0   lisinopril (ZESTRIL) 10 MG tablet Take 1 tablet by mouth once daily 90 tablet 0   metFORMIN (GLUCOPHAGE) 1000 MG tablet Take 1 tablet (1,000 mg total) by mouth 2 (two) times daily. 60 tablet 2   blood glucose meter kit and supplies Dispense based on patient and insurance preference. Use up to four times daily as directed. (FOR ICD-10 E10.9, E11.9). (Patient not taking: Reported on 07/29/2022) 1 each 0   metroNIDAZOLE (FLAGYL) 500 MG tablet Take 1 tablet (500 mg total) by mouth 2 (two) times daily. (Patient not taking: Reported on 07/29/2022) 14 tablet 0   tirzepatide (MOUNJARO) 2.5 MG/0.5ML Pen Inject 2.5 mg into the skin once a week. (Patient not taking: Reported on 07/29/2022) 2 mL 0   tirzepatide (MOUNJARO) 5 MG/0.5ML Pen Inject 5 mg into the skin once a week. (Patient not taking: Reported on 07/23/2022) 6 mL 0   No facility-administered medications prior to visit.    Allergies  Allergen Reactions   Amoxicillin Swelling and Anaphylaxis    ROS Review of Systems  Constitutional:  Positive for fatigue. Negative for chills and fever.  HENT:  Negative for congestion, sinus pressure, sinus pain and sore throat.   Eyes:  Negative for pain and discharge.  Respiratory:  Negative for cough and shortness of breath.   Cardiovascular:  Negative for chest pain and palpitations.  Gastrointestinal:  Positive for diarrhea. Negative for abdominal pain, constipation, nausea and vomiting.  Endocrine: Negative for polydipsia and polyuria.  Genitourinary:  Negative for dysuria and hematuria.  Musculoskeletal:  Negative for neck pain and neck stiffness.  Skin:  Negative for rash.  Neurological:  Negative for dizziness and weakness.  Psychiatric/Behavioral:  Negative for agitation and behavioral problems.       Objective:    Physical Exam Vitals reviewed.  Constitutional:      General: She is not in acute distress.    Appearance: She is obese. She is not diaphoretic.  HENT:      Head: Normocephalic and atraumatic.     Nose: Nose normal.     Mouth/Throat:     Mouth: Mucous membranes are moist.  Eyes:     General: No scleral icterus.    Extraocular Movements: Extraocular movements intact.  Cardiovascular:     Rate and Rhythm: Normal rate and regular rhythm.     Pulses: Normal pulses.     Heart sounds: Normal heart sounds. No murmur heard. Pulmonary:     Breath sounds: Normal breath sounds. No wheezing or rales.  Musculoskeletal:     Cervical back: Neck supple. No tenderness.  Right lower leg: No edema.     Left lower leg: No edema.  Skin:    General: Skin is warm.     Findings: No rash.  Neurological:     General: No focal deficit present.     Mental Status: She is alert and oriented to person, place, and time.     Sensory: No sensory deficit.     Motor: No weakness.  Psychiatric:        Mood and Affect: Mood normal.        Behavior: Behavior normal.     BP 124/86 (BP Location: Left Arm, Patient Position: Sitting, Cuff Size: Normal)   Resp 16   Ht '5\' 5"'  (1.651 m)   Wt 186 lb 6.4 oz (84.6 kg)   LMP 07/17/2022 (Approximate)   BMI 31.02 kg/m  Wt Readings from Last 3 Encounters:  07/29/22 186 lb 6.4 oz (84.6 kg)  02/09/22 182 lb 3.2 oz (82.6 kg)  12/15/21 180 lb 12.4 oz (82 kg)    Lab Results  Component Value Date   TSH 1.56 07/08/2016   Lab Results  Component Value Date   WBC 8.1 12/15/2021   HGB 13.5 12/15/2021   HCT 40.4 12/15/2021   MCV 87.3 12/15/2021   PLT 419 (H) 12/15/2021   Lab Results  Component Value Date   NA 134 (L) 12/15/2021   K 3.2 (L) 12/15/2021   CO2 25 12/15/2021   GLUCOSE 333 (H) 12/15/2021   BUN 19 12/15/2021   CREATININE 0.71 12/15/2021   BILITOT 0.3 12/15/2021   ALKPHOS 89 12/15/2021   AST 17 12/15/2021   ALT 28 12/15/2021   PROT 7.6 12/15/2021   ALBUMIN 4.0 12/15/2021   CALCIUM 8.6 (L) 12/15/2021   ANIONGAP 14 12/15/2021   EGFR 112 07/12/2021   Lab Results  Component Value Date   CHOL 179  12/05/2016   Lab Results  Component Value Date   HDL 33 (L) 12/05/2016   Lab Results  Component Value Date   LDLCALC 133 (H) 12/05/2016   Lab Results  Component Value Date   TRIG 64 12/05/2016   Lab Results  Component Value Date   CHOLHDL 5.4 (H) 12/05/2016   Lab Results  Component Value Date   HGBA1C 10.7 (A) 02/09/2022      Assessment & Plan:   Problem List Items Addressed This Visit       Cardiovascular and Mediastinum   Essential hypertension    BP Readings from Last 1 Encounters:  07/29/22 124/86  Well-controlled with lisinopril Counseled for compliance with the medications Advised DASH diet and moderate exercise/walking, at least 150 mins/week         Endocrine   Diabetes mellitus ([place]) - Primary    Lab Results  Component Value Date   HGBA1C 10.7 (A) 02/09/2022  Uncontrolled due to noncompliance On metformin currently, but has diarrhea with it DC metformin and start glipizide 5 mg twice daily Added Trulicity for better glycemic control, plan to increase dose as tolerated Advised to follow diabetic diet On ACEi F/u CMP and lipid panel, plan to add statin based on lipid profile Diabetic eye exam: Advised to follow up with Ophthalmology for diabetic eye exam      Relevant Medications   glipiZIDE (GLUCOTROL) 5 MG tablet   Dulaglutide (TRULICITY) 8.46 NG/2.9BM SOPN   Other Relevant Orders   CMP14+EGFR   Hemoglobin A1c     Genitourinary   Uterine leiomyoma    Needs to f/u with Ob/Gyn.  Was told that she needs to get her type II DM under control to be considered for procedure        Other   Obesity (BMI 30-39.9)    Advised to follow low carb diet and perform moderate exercise/walking at least 150 mins/week Started Trulicity for type 2 DM      Relevant Medications   glipiZIDE (GLUCOTROL) 5 MG tablet   Dulaglutide (TRULICITY) 7.28 AS/6.0RV SOPN   Other Visit Diagnoses     Mixed hyperlipidemia       Relevant Orders   Lipid Profile    Refused influenza vaccine           Meds ordered this encounter  Medications   glipiZIDE (GLUCOTROL) 5 MG tablet    Sig: Take 1 tablet (5 mg total) by mouth 2 (two) times daily before a meal.    Dispense:  60 tablet    Refill:  3   Dulaglutide (TRULICITY) 6.15 PP/9.4FE SOPN    Sig: Inject 0.75 mg into the skin once a week.    Dispense:  2 mL    Refill:  0    Follow-up: Return in about 3 months (around 10/28/2022) for DM.    Lindell Spar, MD

## 2022-07-30 ENCOUNTER — Other Ambulatory Visit: Payer: Self-pay | Admitting: Internal Medicine

## 2022-07-30 DIAGNOSIS — E782 Mixed hyperlipidemia: Secondary | ICD-10-CM

## 2022-07-30 LAB — CMP14+EGFR
ALT: 18 IU/L (ref 0–32)
AST: 9 IU/L (ref 0–40)
Albumin/Globulin Ratio: 1.5 (ref 1.2–2.2)
Albumin: 3.8 g/dL — ABNORMAL LOW (ref 3.9–4.9)
Alkaline Phosphatase: 106 IU/L (ref 44–121)
BUN/Creatinine Ratio: 16 (ref 9–23)
BUN: 9 mg/dL (ref 6–20)
Bilirubin Total: 0.4 mg/dL (ref 0.0–1.2)
CO2: 22 mmol/L (ref 20–29)
Calcium: 9 mg/dL (ref 8.7–10.2)
Chloride: 101 mmol/L (ref 96–106)
Creatinine, Ser: 0.55 mg/dL — ABNORMAL LOW (ref 0.57–1.00)
Globulin, Total: 2.6 g/dL (ref 1.5–4.5)
Glucose: 261 mg/dL — ABNORMAL HIGH (ref 70–99)
Potassium: 4.1 mmol/L (ref 3.5–5.2)
Sodium: 138 mmol/L (ref 134–144)
Total Protein: 6.4 g/dL (ref 6.0–8.5)
eGFR: 123 mL/min/{1.73_m2} (ref >59)

## 2022-07-30 LAB — LIPID PANEL
Chol/HDL Ratio: 5.4 ratio — ABNORMAL HIGH (ref 0.0–4.4)
Cholesterol, Total: 190 mg/dL (ref 100–199)
HDL: 35 mg/dL — ABNORMAL LOW (ref >39)
LDL Chol Calc (NIH): 129 mg/dL — ABNORMAL HIGH (ref 0–99)
Triglycerides: 146 mg/dL (ref 0–149)
VLDL Cholesterol Cal: 26 mg/dL (ref 5–40)

## 2022-07-30 LAB — HEMOGLOBIN A1C
Est. average glucose Bld gHb Est-mCnc: 269 mg/dL
Hgb A1c MFr Bld: 11 % — ABNORMAL HIGH (ref 4.8–5.6)

## 2022-07-30 MED ORDER — ROSUVASTATIN CALCIUM 10 MG PO TABS: 10.0000 mg | ORAL_TABLET | Freq: Every day | ORAL | 1 refills | Status: DC

## 2022-07-31 ENCOUNTER — Telehealth: Payer: Self-pay | Admitting: Internal Medicine

## 2022-07-31 NOTE — Telephone Encounter (Signed)
Patient sent my chart message in regard to Dulaglutide (TRULICITY) 1.28 NO/6.7EH SOPN  States that medicine is $700 and cannot afford.

## 2022-08-03 ENCOUNTER — Telehealth: Payer: Self-pay | Admitting: Internal Medicine

## 2022-08-03 NOTE — Telephone Encounter (Signed)
Pt advised to check with pharmacy this was prior auth and should be covered she will check at pharmacy

## 2022-08-03 NOTE — Telephone Encounter (Signed)
See other phone message  

## 2022-08-03 NOTE — Telephone Encounter (Signed)
LVM for pt to call the office prior auth was submitted and approved should not be this price

## 2022-08-03 NOTE — Telephone Encounter (Signed)
Pt return call in regard to Trulicity

## 2022-08-06 ENCOUNTER — Telehealth: Payer: Self-pay | Admitting: Internal Medicine

## 2022-08-06 NOTE — Telephone Encounter (Signed)
Pt called stating she is still having an issue with getting her medication cheaper. She has talked to phar & it is still $730. States she may need to get it sent to another phar. Wants to speak with nurse in regards to this. Can you please contact patient?

## 2022-08-06 NOTE — Telephone Encounter (Signed)
Spoke with pharmacy it is 700 some dollars with prior auth advised patient of coupon to take to the pharmacy

## 2022-08-07 ENCOUNTER — Ambulatory Visit: Payer: 59

## 2022-08-07 ENCOUNTER — Ambulatory Visit
Admission: RE | Admit: 2022-08-07 | Discharge: 2022-08-07 | Discharge status: Absent without leave | Payer: 59 | Source: Ambulatory Visit | Attending: Internal Medicine | Admitting: Internal Medicine

## 2022-08-08 ENCOUNTER — Encounter: Payer: Self-pay | Admitting: Emergency Medicine

## 2022-08-08 ENCOUNTER — Ambulatory Visit
Admission: EM | Admit: 2022-08-08 | Discharge: 2022-08-08 | Disposition: A | Discharge status: Home / Self Care | Payer: 59 | Attending: Family Medicine | Admitting: Family Medicine

## 2022-08-08 DIAGNOSIS — B3731 Acute candidiasis of vulva and vagina: Secondary | ICD-10-CM | POA: Diagnosis not present

## 2022-08-08 LAB — POCT URINALYSIS DIP (MANUAL ENTRY)
Bilirubin, UA: NEGATIVE
Blood, UA: NEGATIVE
Glucose, UA: >=1000 mg/dL — AB
Ketones, POC UA: NEGATIVE mg/dL
Nitrite, UA: NEGATIVE
Protein Ur, POC: NEGATIVE mg/dL
Spec Grav, UA: 1.025 (ref 1.010–1.025)
Urobilinogen, UA: 0.2 E.U./dL
pH, UA: 5.5 (ref 5.0–8.0)

## 2022-08-08 MED ORDER — FLUCONAZOLE 150 MG PO TABS: 150.0000 mg | ORAL_TABLET | ORAL | 0 refills | Status: DC

## 2022-08-08 NOTE — ED Provider Notes (Signed)
[facility]    CSN: [id number] Arrival date & time: 08/08/22  1316      History   Chief Complaint No chief complaint on file.   HPI Mallory Mcdowell is a 36 y.o. female.   Patient presenting today with 1 to 2-day history of vaginal irritation and itching, white discharge.  Denies dysuria, hematuria, abdominal pain, nausea vomiting diarrhea, fevers, concern for STIs or pregnancy.  LMP 07/17/22.  Not tried anything over-the-counter for symptoms.  Does have poorly controlled diabetes, states her blood sugars have been in the 200s recently.    Past Medical History:  Diagnosis Date   Diabetes mellitus without complication ([place])    Hypertension     Patient Active Problem List   Diagnosis Date Noted   Mixed hyperlipidemia 07/30/2022   Uterine leiomyoma 07/29/2022   Obesity (BMI 30-39.9) 07/29/2022   Essential hypertension 02/12/2022   Diabetes mellitus ([place]) 02/09/2022   Tobacco abuse 02/09/2022    History reviewed. No pertinent surgical history.  OB History     Gravida  0   Para  0   Term  0   Preterm  0   AB  0   Living  0      SAB  0   IAB  0   Ectopic  0   Multiple  0   Live Births               Home Medications    Prior to Admission medications   Medication Sig Start Date End Date Taking? Authorizing Provider  fluconazole (DIFLUCAN) 150 MG tablet Take 1 tablet (150 mg total) by mouth every other day. 08/08/22  Yes Volney American, PA-C  Blood Glucose Monitoring Suppl w/Device KIT 1 each by Does not apply route in the morning, at noon, in the evening, and at bedtime. Check blood sugar 4 times daily 09/03/20   Florian Buff, MD  Dulaglutide (TRULICITY) 7.84 ON/6.2XB SOPN Inject 0.75 mg into the skin once a week. 07/29/22   Lindell Spar, MD  glipiZIDE (GLUCOTROL) 5 MG tablet Take 1 tablet (5 mg total) by mouth 2 (two) times daily before a meal. 07/29/22   Lindell Spar, MD  lisinopril (ZESTRIL) 10 MG tablet Take 1 tablet  by mouth once daily 04/30/22   Lindell Spar, MD  rosuvastatin (CRESTOR) 10 MG tablet Take 1 tablet (10 mg total) by mouth daily. 07/30/22   Lindell Spar, MD    Family History Family History  Problem Relation Age of Onset   Asthma Mother    Diabetes Other    Heart failure Other    Cancer Other     Social History Social History   Tobacco Use   Smoking status: Every Day    Packs/day: 0.50    Types: Cigarettes   Smokeless tobacco: Never  Vaping Use   Vaping Use: Never used  Substance Use Topics   Alcohol use: Yes    Alcohol/week: 1.0 standard drink of alcohol    Types: 1 Cans of beer per week    Comment: sometimes daily, but not always   Drug use: Yes    Types: Marijuana     Allergies   Amoxicillin   Review of Systems Review of Systems PER HPI  Physical Exam Triage Vital Signs ED Triage Vitals  Enc Vitals Group     BP 08/08/22 1342 132/88     Pulse Rate 08/08/22 1342 72     Resp 08/08/22  1342 16     Temp 08/08/22 1342 98.1 F (36.7 C)     Temp Source 08/08/22 1342 Oral     SpO2 08/08/22 1342 98 %     Weight --      Height --      Head Circumference --      Peak Flow --      Pain Score 08/08/22 1344 0     Pain Loc --      Pain Edu? --      Excl. in [place]? --    No data found.  Updated Vital Signs BP 132/88 (BP Location: Right Arm)   Pulse 72   Temp 98.1 F (36.7 C) (Oral)   Resp 16   LMP 07/17/2022 (Approximate)   SpO2 98%   Visual Acuity Right Eye Distance:   Left Eye Distance:   Bilateral Distance:    Right Eye Near:   Left Eye Near:    Bilateral Near:     Physical Exam Vitals and nursing note reviewed.  Constitutional:      Appearance: Normal appearance. She is not ill-appearing.  HENT:     Head: Atraumatic.  Eyes:     Extraocular Movements: Extraocular movements intact.     Conjunctiva/sclera: Conjunctivae normal.  Cardiovascular:     Rate and Rhythm: Normal rate and regular rhythm.     Heart sounds: Normal heart sounds.   Pulmonary:     Effort: Pulmonary effort is normal.     Breath sounds: Normal breath sounds.  Abdominal:     General: Bowel sounds are normal. There is no distension.     Palpations: Abdomen is soft.     Tenderness: There is no abdominal tenderness. There is no guarding.  Genitourinary:    Comments: GU exam deferred, self swab performed Musculoskeletal:        General: Normal range of motion.     Cervical back: Normal range of motion and neck supple.  Skin:    General: Skin is warm and dry.  Neurological:     Mental Status: She is alert and oriented to person, place, and time.  Psychiatric:        Mood and Affect: Mood normal.        Thought Content: Thought content normal.        Judgment: Judgment normal.      UC Treatments / Results  Labs (all labs ordered are listed, but only abnormal results are displayed) Labs Reviewed  POCT URINALYSIS DIP (MANUAL ENTRY) - Abnormal; Notable for the following components:      Result Value   Glucose, UA >=1,000 (*)    Leukocytes, UA Trace (*)    All other components within normal limits  CERVICOVAGINAL ANCILLARY ONLY    EKG   Radiology No results found.  Procedures Procedures (including critical care time)  Medications Ordered in UC Medications - No data to display  Initial Impression / Assessment and Plan / UC Course  I have reviewed the triage vital signs and the nursing notes.  Pertinent labs & imaging results that were available during my care of the patient were reviewed by me and considered in my medical decision making (see chart for details).     Suspect yeast vaginitis, will treat with Diflucan while awaiting vaginal swab.  Urinalysis without evidence of UTI.  Discussed importance of blood sugar control to prevent yeast infections going forward.  Final Clinical Impressions(s) / UC Diagnoses   Final diagnoses:  Yeast vaginitis  Discharge Instructions   None    ED Prescriptions     Medication Sig  Dispense Auth. Provider   fluconazole (DIFLUCAN) 150 MG tablet Take 1 tablet (150 mg total) by mouth every other day. 3 tablet Volney American, [place]      PDMP not reviewed this encounter.   Volney American, [place] 08/08/22 1408

## 2022-08-08 NOTE — ED Triage Notes (Signed)
States she thinks she has a yeast infection.  Vaginal itching and white discharge with frequent urination since Monday.

## 2022-08-09 ENCOUNTER — Telehealth: Payer: Self-pay

## 2022-08-09 NOTE — Telephone Encounter (Signed)
Returned call to pt and explained we can not send medications as we need the cervico vaginal swab results first. Pt agreed understanding.

## 2022-08-10 ENCOUNTER — Telehealth (HOSPITAL_COMMUNITY): Payer: Self-pay | Admitting: Emergency Medicine

## 2022-08-10 LAB — CERVICOVAGINAL ANCILLARY ONLY
Bacterial Vaginitis (gardnerella): POSITIVE — AB
Candida Glabrata: NEGATIVE
Candida Vaginitis: POSITIVE — AB
Chlamydia: NEGATIVE
Comment: NEGATIVE
Comment: NEGATIVE
Comment: NEGATIVE
Comment: NEGATIVE
Comment: NEGATIVE
Comment: NORMAL
Neisseria Gonorrhea: NEGATIVE
Trichomonas: NEGATIVE

## 2022-08-10 MED ORDER — METRONIDAZOLE 0.75 % VA GEL: 1.0000 | Freq: Every day | VAGINAL | 0 refills | Status: AC

## 2022-08-14 ENCOUNTER — Telehealth: Payer: Self-pay | Admitting: Family Medicine

## 2022-08-14 MED ORDER — METRONIDAZOLE 500 MG PO TABS: 500.0000 mg | ORAL_TABLET | Freq: Two times a day (BID) | ORAL | 0 refills | Status: DC

## 2022-08-14 NOTE — Telephone Encounter (Signed)
Rx changed from gel to tabs due to cost per patient request

## 2022-09-28 ENCOUNTER — Ambulatory Visit
Admission: RE | Admit: 2022-09-28 | Discharge: 2022-09-28 | Disposition: A | Discharge status: Home / Self Care | Payer: 59 | Source: Ambulatory Visit | Attending: Internal Medicine | Admitting: Internal Medicine

## 2022-10-04 ENCOUNTER — Ambulatory Visit: Payer: 59

## 2022-10-11 ENCOUNTER — Inpatient Hospital Stay
Admission: RE | Admit: 2022-10-11 | Discharge: 2022-10-11 | Disposition: A | Discharge status: Home / Self Care | Payer: 59 | Source: Ambulatory Visit

## 2022-10-11 ENCOUNTER — Ambulatory Visit: Admission: EM | Admit: 2022-10-11 | Discharge: 2022-10-11 | Discharge status: Absent without leave | Payer: 59

## 2022-10-12 ENCOUNTER — Ambulatory Visit
Admission: EM | Admit: 2022-10-12 | Discharge: 2022-10-12 | Disposition: A | Discharge status: Home / Self Care | Payer: 59 | Attending: Nurse Practitioner | Admitting: Nurse Practitioner

## 2022-10-12 VITALS — BP 130/83 | HR 94 | Temp 98.5°F | Resp 16

## 2022-10-12 DIAGNOSIS — R102 Pelvic and perineal pain: Secondary | ICD-10-CM | POA: Diagnosis present

## 2022-10-12 DIAGNOSIS — R300 Dysuria: Secondary | ICD-10-CM | POA: Insufficient documentation

## 2022-10-12 DIAGNOSIS — Z113 Encounter for screening for infections with a predominantly sexual mode of transmission: Secondary | ICD-10-CM | POA: Insufficient documentation

## 2022-10-12 LAB — POCT URINALYSIS DIP (MANUAL ENTRY)
Bilirubin, UA: NEGATIVE
Glucose, UA: >=1000 mg/dL — AB
Leukocytes, UA: NEGATIVE
Nitrite, UA: NEGATIVE
Protein Ur, POC: 100 mg/dL — AB
Spec Grav, UA: 1.02 (ref 1.010–1.025)
Urobilinogen, UA: 0.2 E.U./dL
pH, UA: 5.5 (ref 5.0–8.0)

## 2022-10-12 MED ORDER — NITROFURANTOIN MONOHYD MACRO 100 MG PO CAPS: 100.0000 mg | ORAL_CAPSULE | Freq: Two times a day (BID) | ORAL | 0 refills | Status: AC

## 2022-10-12 NOTE — ED Triage Notes (Signed)
Burning when urinate, increase frequency, lower abdominal pain that started Saturday. Not taking any OTC medication.

## 2022-10-12 NOTE — ED Provider Notes (Signed)
[facility]    CSN: [id number] Arrival date & time: 10/12/22  1221      History   Chief Complaint Chief Complaint  Patient presents with   Abdominal Pain    Piss is burning need to be seen - Entered by patient    HPI Mallory Mcdowell is a 36 y.o. female.   Patient presents today for 3 to 4 days of dysuria, urinary frequency, voiding smaller amounts, darker urine color, suprapubic pain/pressure.  She denies urinary urgency, new urinary incontinence, foul urinary odor, hematuria, abdominal pain, new back pain, fever, body aches, chills, nausea/vomiting, change in appetite, change in bowel habits.  No vaginal discharge, sores, lesions, or open areas on her genitalia.  No groin swelling.  Patient has a history of gonorrhea.  She also has a history of UTI, reports it has "been a while."  Reports she is currently sexually active with 2 partners, does not use condoms.  She is requesting STI testing today.    Past Medical History:  Diagnosis Date   Diabetes mellitus without complication ([place])    Hypertension     Patient Active Problem List   Diagnosis Date Noted   Mixed hyperlipidemia 07/30/2022   Uterine leiomyoma 07/29/2022   Obesity (BMI 30-39.9) 07/29/2022   Essential hypertension 02/12/2022   Diabetes mellitus ([place]) 02/09/2022   Tobacco abuse 02/09/2022    History reviewed. No pertinent surgical history.  OB History     Gravida  0   Para  0   Term  0   Preterm  0   AB  0   Living  0      SAB  0   IAB  0   Ectopic  0   Multiple  0   Live Births               Home Medications    Prior to Admission medications   Medication Sig Start Date End Date Taking? Authorizing Provider  Blood Glucose Monitoring Suppl w/Device KIT 1 each by Does not apply route in the morning, at noon, in the evening, and at bedtime. Check blood sugar 4 times daily 09/03/20  Yes Eure, Mertie Clause, MD  glipiZIDE (GLUCOTROL) 5 MG tablet Take 1 tablet (5 mg total)  by mouth 2 (two) times daily before a meal. 07/29/22  Yes Lindell Spar, MD  nitrofurantoin, macrocrystal-monohydrate, (MACROBID) 100 MG capsule Take 1 capsule (100 mg total) by mouth 2 (two) times daily for 5 days. 10/12/22 10/17/22 Yes Eulogio Bear, NP    Family History Family History  Problem Relation Age of Onset   Asthma Mother    Diabetes Other    Heart failure Other    Cancer Other     Social History Social History   Tobacco Use   Smoking status: Every Day    Packs/day: 0.50    Types: Cigarettes   Smokeless tobacco: Never  Vaping Use   Vaping Use: Never used  Substance Use Topics   Alcohol use: Yes    Alcohol/week: 1.0 standard drink of alcohol    Types: 1 Cans of beer per week    Comment: sometimes daily, but not always   Drug use: Yes    Types: Marijuana     Allergies   Amoxicillin   Review of Systems Review of Systems Per HPI  Physical Exam Triage Vital Signs ED Triage Vitals [10/12/22 1321]  Enc Vitals Group     BP 130/83  Pulse Rate 94     Resp 16     Temp 98.5 F (36.9 C)     Temp Source Oral     SpO2 96 %     Weight      Height      Head Circumference      Peak Flow      Pain Score 0     Pain Loc      Pain Edu?      Excl. in [place]?    No data found.  Updated Vital Signs BP 130/83 (BP Location: Right Arm)   Pulse 94   Temp 98.5 F (36.9 C) (Oral)   Resp 16   LMP 10/09/2022 (Approximate)   SpO2 96%   Visual Acuity Right Eye Distance:   Left Eye Distance:   Bilateral Distance:    Right Eye Near:   Left Eye Near:    Bilateral Near:     Physical Exam Vitals and nursing note reviewed.  Constitutional:      General: She is not in acute distress.    Appearance: She is not toxic-appearing.  Pulmonary:     Effort: Pulmonary effort is normal. No respiratory distress.  Abdominal:     General: Abdomen is flat. Bowel sounds are normal. There is no distension.     Palpations: Abdomen is soft. There is no mass.      Tenderness: There is abdominal tenderness in the suprapubic area. There is no right CVA tenderness, left CVA tenderness or guarding. Negative signs include Murphy's sign and McBurney's sign.  Genitourinary:    Comments: Deferred - self swab performed Skin:    General: Skin is warm and dry.     Coloration: Skin is not jaundiced or pale.     Findings: No erythema.  Neurological:     Mental Status: She is alert and oriented to person, place, and time.     Motor: No weakness.     Gait: Gait normal.  Psychiatric:        Behavior: Behavior is cooperative.      UC Treatments / Results  Labs (all labs ordered are listed, but only abnormal results are displayed) Labs Reviewed  POCT URINALYSIS DIP (MANUAL ENTRY) - Abnormal; Notable for the following components:      Result Value   Color, UA red (*)    Clarity, UA cloudy (*)    Glucose, UA >=1,000 (*)    Ketones, POC UA trace (5) (*)    Blood, UA large (*)    Protein Ur, POC =100 (*)    All other components within normal limits  URINE CULTURE  HIV ANTIBODY (ROUTINE TESTING W REFLEX)  RPR  CERVICOVAGINAL ANCILLARY ONLY    EKG   Radiology No results found.  Procedures Procedures (including critical care time)  Medications Ordered in UC Medications - No data to display  Initial Impression / Assessment and Plan / UC Course  I have reviewed the triage vital signs and the nursing notes.  Pertinent labs & imaging results that were available during my care of the patient were reviewed by me and considered in my medical decision making (see chart for details).   Patient is well-appearing, normotensive, afebrile, not tachycardic, not tachypneic, oxygenating well on room air.    Suprapubic pain Dysuria Urinalysis today cloudy, shows large amount of blood which is most likely coming from menses Given symptoms, will start patient on Macrobid twice daily for 5 days Urine culture pending  Routine  screening for STI (sexually  transmitted infection) Vaginal self swab performed to check for chlamydia, gonorrhea, trichomonas, yeast vaginitis, bacterial vaginosis Patient not having symptoms today, no known exposures Patient also desires HIV and syphilis testing today-this was performed Encouraged condom use every sexual counter   The patient was given the opportunity to ask questions.  All questions answered to their satisfaction.  The patient is in agreement to this plan.    Final Clinical Impressions(s) / UC Diagnoses   Final diagnoses:  Suprapubic pain  Routine screening for STI (sexually transmitted infection)  Dysuria     Discharge Instructions      Urinalysis today shows blood, this may be coming from your menses.  Since you are having symptoms of a UTI, lets go ahead and get you on treatment while the urine culture is pending.  Please start the [place] and take it twice daily for 5 days.  We will call you with any abnormal results from the vaginal swab, blood test, or urine culture.  Please use condoms with every sexual encounter moving forward to prevent STI.     ED Prescriptions     Medication Sig Dispense Auth. Provider   nitrofurantoin, macrocrystal-monohydrate, (MACROBID) 100 MG capsule Take 1 capsule (100 mg total) by mouth 2 (two) times daily for 5 days. 10 capsule Eulogio Bear, NP      PDMP not reviewed this encounter.   Eulogio Bear, NP 10/12/22 1536

## 2022-10-12 NOTE — Discharge Instructions (Addendum)
Urinalysis today shows blood, this may be coming from your menses.  Since you are having symptoms of a UTI, lets go ahead and get you on treatment while the urine culture is pending.  Please start the [place] and take it twice daily for 5 days.  We will call you with any abnormal results from the vaginal swab, blood test, or urine culture.  Please use condoms with every sexual encounter moving forward to prevent STI.

## 2022-10-13 LAB — CERVICOVAGINAL ANCILLARY ONLY
Bacterial Vaginitis (gardnerella): POSITIVE — AB
Candida Glabrata: NEGATIVE
Candida Vaginitis: NEGATIVE
Chlamydia: NEGATIVE
Comment: NEGATIVE
Comment: NEGATIVE
Comment: NEGATIVE
Comment: NEGATIVE
Comment: NEGATIVE
Comment: NORMAL
Neisseria Gonorrhea: NEGATIVE
Trichomonas: NEGATIVE

## 2022-10-13 LAB — RPR: RPR Ser Ql: NONREACTIVE

## 2022-10-13 LAB — HIV ANTIBODY (ROUTINE TESTING W REFLEX): HIV Screen 4th Generation wRfx: NONREACTIVE

## 2022-10-14 ENCOUNTER — Telehealth (HOSPITAL_COMMUNITY): Payer: Self-pay | Admitting: Emergency Medicine

## 2022-10-14 LAB — URINE CULTURE: Culture: NO GROWTH

## 2022-10-14 MED ORDER — METRONIDAZOLE 500 MG PO TABS: 500.0000 mg | ORAL_TABLET | Freq: Two times a day (BID) | ORAL | 0 refills | Status: DC

## 2022-10-18 ENCOUNTER — Telehealth: Payer: Self-pay | Admitting: Emergency Medicine

## 2022-10-18 MED ORDER — METRONIDAZOLE 500 MG PO TABS: 500.0000 mg | ORAL_TABLET | Freq: Two times a day (BID) | ORAL | 0 refills | Status: DC

## 2022-10-18 NOTE — Telephone Encounter (Signed)
Patient states she lost her Rx for flagyl.  Medication resent to [place].

## 2022-10-29 ENCOUNTER — Encounter: Payer: Self-pay | Admitting: Internal Medicine

## 2022-10-29 ENCOUNTER — Ambulatory Visit: Payer: 59 | Admitting: Internal Medicine

## 2022-11-03 ENCOUNTER — Ambulatory Visit
Admission: RE | Admit: 2022-11-03 | Discharge: 2022-11-03 | Disposition: A | Discharge status: Home / Self Care | Payer: Medicaid Other | Source: Ambulatory Visit | Attending: Family Medicine | Admitting: Family Medicine

## 2022-11-03 VITALS — BP 141/96 | HR 84 | Temp 98.2°F | Resp 20

## 2022-11-03 DIAGNOSIS — N760 Acute vaginitis: Secondary | ICD-10-CM | POA: Insufficient documentation

## 2022-11-03 MED ORDER — FLUCONAZOLE 150 MG PO TABS: 150.0000 mg | ORAL_TABLET | ORAL | 0 refills | Status: DC

## 2022-11-03 NOTE — ED Provider Notes (Signed)
[facility]    CSN: [id number] Arrival date & time: 11/03/22  1154      History   Chief Complaint Chief Complaint  Patient presents with   Abdominal Pain    Entered by patient    HPI Mallory Mcdowell is a 36 y.o. female.   Patient presenting today with 3-day history of vaginal itching and irritation.  Denies discharge, odor, pelvic or abdominal pain, urinary symptoms.  No known exposures to STDs and no concern for pregnancy today.  LMP was 10/09/2022.  Tried anything over-the-counter for symptoms.  Prone to yeast infections and BV.    Past Medical History:  Diagnosis Date   Diabetes mellitus without complication ([place])    Hypertension     Patient Active Problem List   Diagnosis Date Noted   Mixed hyperlipidemia 07/30/2022   Uterine leiomyoma 07/29/2022   Obesity (BMI 30-39.9) 07/29/2022   Essential hypertension 02/12/2022   Diabetes mellitus ([place]) 02/09/2022   Tobacco abuse 02/09/2022    History reviewed. No pertinent surgical history.  OB History     Gravida  0   Para  0   Term  0   Preterm  0   AB  0   Living  0      SAB  0   IAB  0   Ectopic  0   Multiple  0   Live Births               Home Medications    Prior to Admission medications   Medication Sig Start Date End Date Taking? Authorizing Provider  fluconazole (DIFLUCAN) 150 MG tablet Take 1 tablet (150 mg total) by mouth every other day. 11/03/22  Yes Volney American, PA-C  Blood Glucose Monitoring Suppl w/Device KIT 1 each by Does not apply route in the morning, at noon, in the evening, and at bedtime. Check blood sugar 4 times daily 09/03/20   Florian Buff, MD  glipiZIDE (GLUCOTROL) 5 MG tablet Take 1 tablet (5 mg total) by mouth 2 (two) times daily before a meal. 07/29/22   Posey Pronto, Colin Broach, MD  metroNIDAZOLE (FLAGYL) 500 MG tablet Take 1 tablet (500 mg total) by mouth 2 (two) times daily. 10/18/22   LampteyMyrene Galas, MD    Family History Family History   Problem Relation Age of Onset   Asthma Mother    Diabetes Other    Heart failure Other    Cancer Other     Social History Social History   Tobacco Use   Smoking status: Every Day    Packs/day: 0.50    Types: Cigarettes   Smokeless tobacco: Never  Vaping Use   Vaping Use: Never used  Substance Use Topics   Alcohol use: Yes    Alcohol/week: 1.0 standard drink of alcohol    Types: 1 Cans of beer per week    Comment: sometimes daily, but not always   Drug use: Yes    Types: Marijuana     Allergies   Amoxicillin   Review of Systems Review of Systems Per HPI  Physical Exam Triage Vital Signs ED Triage Vitals  Enc Vitals Group     BP 11/03/22 1239 (!) 141/96     Pulse Rate 11/03/22 1239 84     Resp 11/03/22 1239 20     Temp 11/03/22 1239 98.2 F (36.8 C)     Temp Source 11/03/22 1239 Oral     SpO2 11/03/22 1239 98 %  Weight --      Height --      Head Circumference --      Peak Flow --      Pain Score 11/03/22 1243 0     Pain Loc --      Pain Edu? --      Excl. in [place]? --    No data found.  Updated Vital Signs BP (!) 141/96 (BP Location: Right Arm)   Pulse 84   Temp 98.2 F (36.8 C) (Oral)   Resp 20   LMP 10/09/2022 (Approximate)   SpO2 98%   Visual Acuity Right Eye Distance:   Left Eye Distance:   Bilateral Distance:    Right Eye Near:   Left Eye Near:    Bilateral Near:     Physical Exam Vitals and nursing note reviewed.  Constitutional:      Appearance: Normal appearance. She is not ill-appearing.  HENT:     Head: Atraumatic.  Eyes:     Extraocular Movements: Extraocular movements intact.     Conjunctiva/sclera: Conjunctivae normal.  Cardiovascular:     Rate and Rhythm: Normal rate and regular rhythm.     Heart sounds: Normal heart sounds.  Pulmonary:     Effort: Pulmonary effort is normal.     Breath sounds: Normal breath sounds.  Abdominal:     General: Bowel sounds are normal. There is no distension.     Palpations:  Abdomen is soft.     Tenderness: There is no abdominal tenderness. There is no right CVA tenderness, left CVA tenderness or guarding.  Genitourinary:    Comments: GU exam deferred, self swab performed Musculoskeletal:        General: Normal range of motion.     Cervical back: Normal range of motion and neck supple.  Skin:    General: Skin is warm and dry.  Neurological:     Mental Status: She is alert and oriented to person, place, and time.  Psychiatric:        Mood and Affect: Mood normal.        Thought Content: Thought content normal.        Judgment: Judgment normal.      UC Treatments / Results  Labs (all labs ordered are listed, but only abnormal results are displayed) Labs Reviewed  CERVICOVAGINAL ANCILLARY ONLY    EKG   Radiology No results found.  Procedures Procedures (including critical care time)  Medications Ordered in UC Medications - No data to display  Initial Impression / Assessment and Plan / UC Course  I have reviewed the triage vital signs and the nursing notes.  Pertinent labs & imaging results that were available during my care of the patient were reviewed by me and considered in my medical decision making (see chart for details).     Suspect yeast vaginitis, treat with Diflucan while awaiting vaginal swab for further rule out.  Adjust if needed.  Discussed supportive home care and return precautions.  Final Clinical Impressions(s) / UC Diagnoses   Final diagnoses:  Acute vaginitis   Discharge Instructions   None    ED Prescriptions     Medication Sig Dispense Auth. Provider   fluconazole (DIFLUCAN) 150 MG tablet Take 1 tablet (150 mg total) by mouth every other day. 3 tablet Volney American, [place]      PDMP not reviewed this encounter.   Volney American, [place] 11/03/22 1304

## 2022-11-03 NOTE — ED Triage Notes (Signed)
Pt reports vaginal itching x 3 days.

## 2022-11-04 LAB — CERVICOVAGINAL ANCILLARY ONLY
Bacterial Vaginitis (gardnerella): POSITIVE — AB
Candida Glabrata: NEGATIVE
Candida Vaginitis: POSITIVE — AB
Chlamydia: NEGATIVE
Comment: NEGATIVE
Comment: NEGATIVE
Comment: NEGATIVE
Comment: NEGATIVE
Comment: NEGATIVE
Comment: NORMAL
Neisseria Gonorrhea: NEGATIVE
Trichomonas: NEGATIVE

## 2022-11-05 ENCOUNTER — Telehealth (HOSPITAL_COMMUNITY): Payer: Self-pay | Admitting: Emergency Medicine

## 2022-11-05 ENCOUNTER — Ambulatory Visit: Payer: Medicaid Other | Admitting: Internal Medicine

## 2022-11-05 MED ORDER — METRONIDAZOLE 500 MG PO TABS: 500.0000 mg | ORAL_TABLET | Freq: Two times a day (BID) | ORAL | 0 refills | Status: DC

## 2022-11-22 ENCOUNTER — Ambulatory Visit
Admission: RE | Admit: 2022-11-22 | Discharge: 2022-11-22 | Disposition: A | Discharge status: Home / Self Care | Payer: Medicaid Other | Source: Ambulatory Visit | Attending: Family Medicine | Admitting: Family Medicine

## 2022-11-22 VITALS — BP 135/88 | HR 96 | Temp 97.9°F | Resp 20

## 2022-11-22 DIAGNOSIS — N760 Acute vaginitis: Secondary | ICD-10-CM

## 2022-11-22 DIAGNOSIS — R3915 Urgency of urination: Secondary | ICD-10-CM | POA: Diagnosis not present

## 2022-11-22 DIAGNOSIS — R739 Hyperglycemia, unspecified: Secondary | ICD-10-CM | POA: Diagnosis present

## 2022-11-22 DIAGNOSIS — R350 Frequency of micturition: Secondary | ICD-10-CM

## 2022-11-22 LAB — POCT URINALYSIS DIP (MANUAL ENTRY)
Bilirubin, UA: NEGATIVE
Blood, UA: NEGATIVE
Glucose, UA: >=1000 mg/dL — AB
Ketones, POC UA: NEGATIVE mg/dL
Leukocytes, UA: NEGATIVE
Nitrite, UA: NEGATIVE
Protein Ur, POC: NEGATIVE mg/dL
Spec Grav, UA: 1.015 (ref 1.010–1.025)
Urobilinogen, UA: 0.2 E.U./dL
pH, UA: 6 (ref 5.0–8.0)

## 2022-11-22 LAB — POCT FASTING CBG KUC MANUAL ENTRY: POCT Glucose (KUC): 322 mg/dL — AB (ref 70–99)

## 2022-11-22 NOTE — ED Provider Notes (Signed)
[facility]    CSN: [id number] Arrival date & time: 11/22/22  1103      History   Chief Complaint Chief Complaint  Patient presents with   Abdominal Pain    Entered by patient    HPI Mallory Mcdowell is a 37 y.o. female.   Patient presenting today with 3-day history of urinary frequency, urgency, suprapubic pressure and feeling like she cannot fully empty her bladder.  She is also having some ongoing vaginal issues with a history of recurrent BV and yeast infections.  Denies fever, chills, chest pain, shortness of breath, palpitations, sweats, body aches, hematuria, bowel changes, rashes or lesions.  So far not tried anything over-the-counter for symptoms.  She has a history of diabetes on glipizide, does not check her blood sugars.    Past Medical History:  Diagnosis Date   Diabetes mellitus without complication ([place])    Hypertension     Patient Active Problem List   Diagnosis Date Noted   Mixed hyperlipidemia 07/30/2022   Uterine leiomyoma 07/29/2022   Obesity (BMI 30-39.9) 07/29/2022   Essential hypertension 02/12/2022   Diabetes mellitus ([place]) 02/09/2022   Tobacco abuse 02/09/2022    History reviewed. No pertinent surgical history.  OB History     Gravida  0   Para  0   Term  0   Preterm  0   AB  0   Living  0      SAB  0   IAB  0   Ectopic  0   Multiple  0   Live Births               Home Medications    Prior to Admission medications   Medication Sig Start Date End Date Taking? Authorizing Provider  Blood Glucose Monitoring Suppl w/Device KIT 1 each by Does not apply route in the morning, at noon, in the evening, and at bedtime. Check blood sugar 4 times daily 09/03/20   Florian Buff, MD  fluconazole (DIFLUCAN) 150 MG tablet Take 1 tablet (150 mg total) by mouth every other day. 11/03/22   Volney American, PA-C  glipiZIDE (GLUCOTROL) 5 MG tablet Take 1 tablet (5 mg total) by mouth 2 (two) times daily before a  meal. 07/29/22   Posey Pronto, Colin Broach, MD  metroNIDAZOLE (FLAGYL) 500 MG tablet Take 1 tablet (500 mg total) by mouth 2 (two) times daily. 11/05/22   LampteyMyrene Galas, MD    Family History Family History  Problem Relation Age of Onset   Asthma Mother    Diabetes Other    Heart failure Other    Cancer Other     Social History Social History   Tobacco Use   Smoking status: Every Day    Packs/day: 0.50    Types: Cigarettes   Smokeless tobacco: Never  Vaping Use   Vaping Use: Never used  Substance Use Topics   Alcohol use: Yes    Alcohol/week: 1.0 standard drink of alcohol    Types: 1 Cans of beer per week    Comment: sometimes daily, but not always   Drug use: Yes    Types: Marijuana     Allergies   Amoxicillin   Review of Systems Review of Systems PER HPI  Physical Exam Triage Vital Signs ED Triage Vitals  Enc Vitals Group     BP 11/22/22 1125 135/88     Pulse Rate 11/22/22 1125 96     Resp 11/22/22 1125  20     Temp 11/22/22 1125 97.9 F (36.6 C)     Temp Source 11/22/22 1125 Oral     SpO2 11/22/22 1125 96 %     Weight --      Height --      Head Circumference --      Peak Flow --      Pain Score 11/22/22 1126 5     Pain Loc --      Pain Edu? --      Excl. in [place]? --    No data found.  Updated Vital Signs BP 135/88 (BP Location: Right Arm)   Pulse 96   Temp 97.9 F (36.6 C) (Oral)   Resp 20   LMP 11/11/2022 (Exact Date)   SpO2 96%   Visual Acuity Right Eye Distance:   Left Eye Distance:   Bilateral Distance:    Right Eye Near:   Left Eye Near:    Bilateral Near:     Physical Exam Vitals and nursing note reviewed.  Constitutional:      General: She is not in acute distress.    Appearance: Normal appearance. She is not ill-appearing.  HENT:     Head: Atraumatic.     Mouth/Throat:     Mouth: Mucous membranes are moist.  Eyes:     Extraocular Movements: Extraocular movements intact.     Conjunctiva/sclera: Conjunctivae normal.   Cardiovascular:     Rate and Rhythm: Normal rate and regular rhythm.     Heart sounds: Normal heart sounds.  Pulmonary:     Effort: Pulmonary effort is normal.     Breath sounds: Normal breath sounds.  Abdominal:     General: Bowel sounds are normal. There is no distension.     Palpations: Abdomen is soft.     Tenderness: There is no abdominal tenderness. There is no right CVA tenderness, left CVA tenderness or guarding.  Genitourinary:    Comments: GU exam deferred, self swab performed Musculoskeletal:        General: Normal range of motion.     Cervical back: Normal range of motion and neck supple.  Skin:    General: Skin is warm and dry.  Neurological:     Mental Status: She is alert and oriented to person, place, and time.  Psychiatric:        Mood and Affect: Mood normal.        Thought Content: Thought content normal.        Judgment: Judgment normal.      UC Treatments / Results  Labs (all labs ordered are listed, but only abnormal results are displayed) Labs Reviewed  POCT URINALYSIS DIP (MANUAL ENTRY) - Abnormal; Notable for the following components:      Result Value   Glucose, UA >=1,000 (*)    All other components within normal limits  POCT FASTING CBG [facility] MANUAL ENTRY - Abnormal; Notable for the following components:   POCT Glucose ([facility]) 322 (*)    All other components within normal limits  CERVICOVAGINAL ANCILLARY ONLY    EKG   Radiology No results found.  Procedures Procedures (including critical care time)  Medications Ordered in UC Medications - No data to display  Initial Impression / Assessment and Plan / UC Course  I have reviewed the triage vital signs and the nursing notes.  Pertinent labs & imaging results that were available during my care of the patient were reviewed by me and considered in my medical  decision making (see chart for details).     Her vitals and exam are benign and reassuring today, suspect at least in part her  symptoms are related to significantly elevated blood sugars.  She is currently at 29 and states this does not seem super abnormal for her though she does not regularly check at home.  She does also have a longstanding history of recurrent BV and yeast infections so vaginal swab pending, will treat based on these results.  Urinalysis with evidence of glucosuria but no urinary tract infection or hematuria.  Discussed the importance of good blood sugar control for many reasons but also to help with urinary symptoms such as the ones that she is having and discussed close follow-up with PCP first thing next week to discuss med changes and lifestyle changes.  Return for any worsening symptoms in the meantime.   Final Clinical Impressions(s) / UC Diagnoses   Final diagnoses:  Urinary urgency  Urinary frequency  Acute vaginitis  Hyperglycemia     Discharge Instructions      Your blood sugar was significantly elevated today.  I am unsure to what extent this is related to your urinary complaints but do feel it is at least in part responsible.  You should follow-up first thing next week with your primary care provider to discuss medication adjustments to get your sugars under better control as this can cause many complications if left in poor control.  Your urinalysis today did not show a urinary tract infection, your vaginal swab is in progress and we will let you know if anything comes back positive and we need to send in treatment.    ED Prescriptions   None    PDMP not reviewed this encounter.   Volney American, [place] 11/22/22 1222

## 2022-11-22 NOTE — ED Triage Notes (Signed)
Pt reports frequent urination and lower abdominal/ pelvic pain x 3 days. Cannot fully empty bladder

## 2022-11-22 NOTE — Discharge Instructions (Signed)
Your blood sugar was significantly elevated today.  I am unsure to what extent this is related to your urinary complaints but do feel it is at least in part responsible.  You should follow-up first thing next week with your primary care provider to discuss medication adjustments to get your sugars under better control as this can cause many complications if left in poor control.  Your urinalysis today did not show a urinary tract infection, your vaginal swab is in progress and we will let you know if anything comes back positive and we need to send in treatment.

## 2022-11-23 ENCOUNTER — Telehealth (HOSPITAL_COMMUNITY): Payer: Self-pay | Admitting: Emergency Medicine

## 2022-11-23 LAB — CERVICOVAGINAL ANCILLARY ONLY
Bacterial Vaginitis (gardnerella): POSITIVE — AB
Candida Glabrata: NEGATIVE
Candida Vaginitis: NEGATIVE
Chlamydia: NEGATIVE
Comment: NEGATIVE
Comment: NEGATIVE
Comment: NEGATIVE
Comment: NEGATIVE
Comment: NEGATIVE
Comment: NORMAL
Neisseria Gonorrhea: NEGATIVE
Trichomonas: NEGATIVE

## 2022-11-23 MED ORDER — METRONIDAZOLE 500 MG PO TABS: 500.0000 mg | ORAL_TABLET | Freq: Two times a day (BID) | ORAL | 0 refills | Status: DC

## 2022-12-31 ENCOUNTER — Encounter: Payer: Self-pay | Admitting: Obstetrics & Gynecology

## 2022-12-31 ENCOUNTER — Other Ambulatory Visit (HOSPITAL_COMMUNITY)
Admission: RE | Admit: 2022-12-31 | Discharge: 2022-12-31 | Disposition: A | Discharge status: Home / Self Care | Payer: Medicaid Other | Source: Ambulatory Visit | Attending: Obstetrics & Gynecology | Admitting: Obstetrics & Gynecology

## 2022-12-31 ENCOUNTER — Ambulatory Visit (INDEPENDENT_AMBULATORY_CARE_PROVIDER_SITE_OTHER): Payer: Medicaid Other | Admitting: Obstetrics & Gynecology

## 2022-12-31 VITALS — BP 132/84 | HR 91 | Ht 65.0 in | Wt 179.0 lb

## 2022-12-31 DIAGNOSIS — Z01419 Encounter for gynecological examination (general) (routine) without abnormal findings: Secondary | ICD-10-CM | POA: Insufficient documentation

## 2022-12-31 DIAGNOSIS — E1165 Type 2 diabetes mellitus with hyperglycemia: Secondary | ICD-10-CM | POA: Diagnosis not present

## 2022-12-31 DIAGNOSIS — D259 Leiomyoma of uterus, unspecified: Secondary | ICD-10-CM | POA: Diagnosis not present

## 2022-12-31 DIAGNOSIS — B3731 Acute candidiasis of vulva and vagina: Secondary | ICD-10-CM | POA: Diagnosis not present

## 2022-12-31 DIAGNOSIS — N946 Dysmenorrhea, unspecified: Secondary | ICD-10-CM

## 2022-12-31 DIAGNOSIS — N920 Excessive and frequent menstruation with regular cycle: Secondary | ICD-10-CM | POA: Diagnosis not present

## 2022-12-31 MED ORDER — TERCONAZOLE 0.4 % VA CREA: 1.0000 | TOPICAL_CREAM | Freq: Every day | VAGINAL | 0 refills | Status: DC

## 2022-12-31 NOTE — Addendum Note (Signed)
Addended by: Octaviano Glow on: 12/31/2022 10:04 AM   Modules accepted: Orders

## 2022-12-31 NOTE — Progress Notes (Signed)
Subjective:     Mallory Mcdowell is a 37 y.o. female here for a routine exam.  Patient's last menstrual period was 12/06/2022. G0P0000 Birth Control Method:  none Menstrual Calendar(currently): pretty regular heavy and severe cramps first 3 days  Current complaints: .   Current acute medical issues:  diabetes without any management really   Recent Gynecologic History Patient's last menstrual period was 12/06/2022. Last Pap: 6/21,  normal Last mammogram: na,    Past Medical History:  Diagnosis Date   Diabetes mellitus without complication ([place])    Hypertension     History reviewed. No pertinent surgical history.  OB History     Gravida  0   Para  0   Term  0   Preterm  0   AB  0   Living  0      SAB  0   IAB  0   Ectopic  0   Multiple  0   Live Births              Social History   Socioeconomic History   Marital status: Single    Spouse name: Not on file   Number of children: Not on file   Years of education: Not on file   Highest education level: Not on file  Occupational History   Not on file  Tobacco Use   Smoking status: Every Day    Packs/day: 0.50    Types: Cigarettes   Smokeless tobacco: Never  Vaping Use   Vaping Use: Never used  Substance and Sexual Activity   Alcohol use: Yes    Alcohol/week: 1.0 standard drink of alcohol    Types: 1 Cans of beer per week    Comment: sometimes daily, but not always   Drug use: Yes    Types: Marijuana   Sexual activity: Yes    Birth control/protection: None  Other Topics Concern   Not on file  Social History Narrative   Not on file   Social Determinants of Health   Financial Resource Strain: Low Risk  (12/31/2022)   Overall Financial Resource Strain (CARDIA)    Difficulty of Paying Living Expenses: Not hard at all  Food Insecurity: No Food Insecurity (12/31/2022)   Hunger Vital Sign    Worried About Running Out of Food in the Last Year: Never true    Ran Out of Food in the Last Year:  Never true  Transportation Needs: No Transportation Needs (12/31/2022)   PRAPARE - Transportation    Lack of Transportation (Medical): No    Lack of Transportation (Non-Medical): No  Physical Activity: Insufficiently Active (12/31/2022)   Exercise Vital Sign    Days of Exercise per Week: 5 days    Minutes of Exercise per Session: 10 min  Stress: Stress Concern Present (12/31/2022)   [place]    Feeling of Stress : Rather much  Social Connections: Moderately Integrated (12/31/2022)   Social Connection and Isolation Panel [NHANES]    Frequency of Communication with Friends and Family: More than three times a week    Frequency of Social Gatherings with Friends and Family: Once a week    Attends Religious Services: 1 to 4 times per year    Active Member of [employer] or Organizations: Yes    Attends Archivist Meetings: 1 to 4 times per year    Marital Status: Never married    Family History  Problem Relation Age of  Onset   Hypertension Maternal Grandmother    Diabetes Maternal Grandmother    Asthma Mother    Diabetes Other    Heart failure Other    Cancer Other      Current Outpatient Medications:    glipiZIDE (GLUCOTROL) 5 MG tablet, Take 1 tablet (5 mg total) by mouth 2 (two) times daily before a meal., Disp: 60 tablet, Rfl: 3   terconazole (TERAZOL 7) 0.4 % vaginal cream, Place 1 applicator vaginally at bedtime., Disp: 45 g, Rfl: 0  Review of Systems  Review of Systems  Constitutional: Negative for fever, chills, weight loss, malaise/fatigue and diaphoresis.  HENT: Negative for hearing loss, ear pain, nosebleeds, congestion, sore throat, neck pain, tinnitus and ear discharge.   Eyes: Negative for blurred vision, double vision, photophobia, pain, discharge and redness.  Respiratory: Negative for cough, hemoptysis, sputum production, shortness of breath, wheezing and stridor.   Cardiovascular: Negative for  chest pain, palpitations, orthopnea, claudication, leg swelling and PND.  Gastrointestinal: negative for abdominal pain. Negative for heartburn, nausea, vomiting, diarrhea, constipation, blood in stool and melena.  Genitourinary: Negative for dysuria, urgency, frequency, hematuria and flank pain.  Musculoskeletal: Negative for myalgias, back pain, joint pain and falls.  Skin: Negative for itching and rash.  Neurological: Negative for dizziness, tingling, tremors, sensory change, speech change, focal weakness, seizures, loss of consciousness, weakness and headaches.  Endo/Heme/Allergies: Negative for environmental allergies and polydipsia. Does not bruise/bleed easily.  Psychiatric/Behavioral: Negative for depression, suicidal ideas, hallucinations, memory loss and substance abuse. The patient is not nervous/anxious and does not have insomnia.        Objective:  Blood pressure 132/84, pulse 91, height 5' 5"$  (1.651 m), weight 179 lb (81.2 kg), last menstrual period 12/06/2022.   Physical Exam  Vitals reviewed. Constitutional: She is oriented to person, place, and time. She appears well-developed and well-nourished.  HENT:  Head: Normocephalic and atraumatic.        Right Ear: External ear normal.  Left Ear: External ear normal.  Nose: Nose normal.  Mouth/Throat: Oropharynx is clear and moist.  Eyes: Conjunctivae and EOM are normal. Pupils are equal, round, and reactive to light. Right eye exhibits no discharge. Left eye exhibits no discharge. No scleral icterus.  Neck: Normal range of motion. Neck supple. No tracheal deviation present. No thyromegaly present.  Cardiovascular: Normal rate, regular rhythm, normal heart sounds and intact distal pulses.  Exam reveals no gallop and no friction rub.   No murmur heard. Respiratory: Effort normal and breath sounds normal. No respiratory distress. She has no wheezes. She has no rales. She exhibits no tenderness.  GI: Soft. Bowel sounds are normal.  She exhibits no distension and no mass. There is no tenderness. There is no rebound and no guarding.  Genitourinary:  Breasts no masses skin changes or nipple changes bilaterally      Vulva is normal without lesions Vagina is pink moist without discharge Cervix normal in appearance and pap is done Uterus is 12 weeks size with multiple small fibroids palpated Adnexa is negative with normal sized ovaries   Musculoskeletal: Normal range of motion. She exhibits no edema and no tenderness.  Neurological: She is alert and oriented to person, place, and time. She has normal reflexes. She displays normal reflexes. No cranial nerve deficit. She exhibits normal muscle tone. Coordination normal.  Skin: Skin is warm and dry. No rash noted. No erythema. No pallor.  Psychiatric: She has a normal mood and affect. Her behavior is normal. Judgment  and thought content normal.       Medications Ordered at today's visit: Meds ordered this encounter  Medications   terconazole (TERAZOL 7) 0.4 % vaginal cream    Sig: Place 1 applicator vaginally at bedtime.    Dispense:  45 g    Refill:  0    Other orders placed at today's visit: Orders Placed This Encounter  Procedures   Hemoglobin A1C      Assessment:    Normal Gyn exam.      ICD-10-CM   1. Well woman exam with routine gynecological exam  Z01.419     2. Uterine leiomyoma, sonogram 05/2020-->519 grams  D25.9     3. Poorly controlled type 2 diabetes mellitus (HCC)  E11.65 Hemoglobin A1C   Check A1C.  Make adjustments to her meds if needed does not have PCP, recommended [facility]    4. Menorrhagia with regular cycle  N92.0     5. Dysmenorrhea  N94.6     6. Yeast vaginitis  B37.31    Rx: Terazol 7(diabetic and recent antibiotic)      Plan:    Has not been monitoring CBG Recommend [facility] Will contact with A1C results and med changes     Return in about 6 weeks (around 02/11/2023) for Follow up, with Dr Elonda Husky.

## 2023-01-01 LAB — CYTOLOGY - PAP
Comment: NEGATIVE
Diagnosis: NEGATIVE
High risk HPV: NEGATIVE

## 2023-01-01 LAB — HEMOGLOBIN A1C
Est. average glucose Bld gHb Est-mCnc: 212 mg/dL
Hgb A1c MFr Bld: 9 % — ABNORMAL HIGH (ref 4.8–5.6)

## 2023-01-18 ENCOUNTER — Ambulatory Visit: Payer: Medicaid Other

## 2023-01-19 ENCOUNTER — Ambulatory Visit: Payer: Medicaid Other

## 2023-01-19 ENCOUNTER — Ambulatory Visit
Admission: RE | Admit: 2023-01-19 | Discharge: 2023-01-19 | Disposition: A | Discharge status: Home / Self Care | Payer: Medicaid Other | Source: Ambulatory Visit | Attending: Internal Medicine | Admitting: Internal Medicine

## 2023-01-19 ENCOUNTER — Other Ambulatory Visit: Payer: Self-pay

## 2023-01-19 VITALS — BP 142/96 | HR 104 | Temp 98.1°F | Resp 20

## 2023-01-19 DIAGNOSIS — E1165 Type 2 diabetes mellitus with hyperglycemia: Secondary | ICD-10-CM | POA: Diagnosis not present

## 2023-01-19 DIAGNOSIS — E1159 Type 2 diabetes mellitus with other circulatory complications: Secondary | ICD-10-CM | POA: Insufficient documentation

## 2023-01-19 DIAGNOSIS — I152 Hypertension secondary to endocrine disorders: Secondary | ICD-10-CM | POA: Insufficient documentation

## 2023-01-19 DIAGNOSIS — Z113 Encounter for screening for infections with a predominantly sexual mode of transmission: Secondary | ICD-10-CM | POA: Diagnosis not present

## 2023-01-19 LAB — POCT URINALYSIS DIP (MANUAL ENTRY)
Bilirubin, UA: NEGATIVE
Glucose, UA: >=1000 mg/dL — AB
Leukocytes, UA: NEGATIVE
Nitrite, UA: NEGATIVE
Protein Ur, POC: NEGATIVE mg/dL
Spec Grav, UA: 1.02 (ref 1.010–1.025)
Urobilinogen, UA: 0.2 E.U./dL
pH, UA: 7 (ref 5.0–8.0)

## 2023-01-19 LAB — POCT URINE PREGNANCY: Preg Test, Ur: NEGATIVE

## 2023-01-19 LAB — POCT FASTING CBG KUC MANUAL ENTRY: POCT Glucose (KUC): 246 mg/dL — AB (ref 70–99)

## 2023-01-19 NOTE — Discharge Instructions (Addendum)
Make an appointment to see this eye doctor and you should see them once a year. [facility] [address] [place],  [place]  [address]  [phone]  Make sure you take your Glipizide daily twice a day and check your glucose fasting which should be less than 100, and 2 hours after eating less than 140.  Take those readings to your primary care doctor in 2 weeks to see if he needs to increase your medication  Ask him to refer you to a nutritionist, so you can get help with your diet  Avoid juices, sweets or soda. Drink more water and walk 30-45 minutes a day  We will call you if the vaginal swab test comes back positive

## 2023-01-19 NOTE — ED Provider Notes (Addendum)
[facility]    CSN: [id number] Arrival date & time: 01/19/23  1747      History   Chief Complaint Chief Complaint  Patient presents with   Abdominal Pain    Entered by patient    HPI Mallory Mcdowell is a 37 y.o. female who presents due to having LLQ pain after done with her cycle. She would like STD testing. States she has had frequency, but no dysuria. Is not concerned of STD's, but wants to be checked. She is a diabetic but does not remember to take her medication bid daily. She admits she drinks juice and soda, and today went to eat at a chinese buffet. Has never seen a nutritionist.  Has history of uterine fibroids and was supposed to have surgery to remove them, but her GYN retired. She has not been doing glucose diaries.  Forgot one of her doses yesterday.    Past Medical History:  Diagnosis Date   Diabetes mellitus without complication ([place])    Hypertension     Patient Active Problem List   Diagnosis Date Noted   Mixed hyperlipidemia 07/30/2022   Uterine leiomyoma 07/29/2022   Obesity (BMI 30-39.9) 07/29/2022   Essential hypertension 02/12/2022   Diabetes mellitus ([place]) 02/09/2022   Tobacco abuse 02/09/2022    History reviewed. No pertinent surgical history.  OB History     Gravida  0   Para  0   Term  0   Preterm  0   AB  0   Living  0      SAB  0   IAB  0   Ectopic  0   Multiple  0   Live Births               Home Medications    Prior to Admission medications   Medication Sig Start Date End Date Taking? Authorizing Provider  glipiZIDE (GLUCOTROL) 5 MG tablet Take 1 tablet (5 mg total) by mouth 2 (two) times daily before a meal. 07/29/22   Lindell Spar, MD    Family History Family History  Problem Relation Age of Onset   Hypertension Maternal Grandmother    Diabetes Maternal Grandmother    Asthma Mother    Diabetes Other    Heart failure Other    Cancer Other     Social History Social History    Tobacco Use   Smoking status: Every Day    Packs/day: 0.50    Types: Cigarettes   Smokeless tobacco: Never  Vaping Use   Vaping Use: Never used  Substance Use Topics   Alcohol use: Yes    Alcohol/week: 1.0 standard drink of alcohol    Types: 1 Cans of beer per week    Comment: sometimes daily, but not always   Drug use: Yes    Types: Marijuana     Allergies   Amoxicillin   Review of Systems Review of Systems  Constitutional:  Negative for fever.  Respiratory:  Negative for chest tightness.   Cardiovascular:  Negative for chest pain.  Gastrointestinal:  Positive for abdominal pain. Negative for constipation, diarrhea, nausea and vomiting.  Endocrine: Positive for polydipsia, polyphagia and polyuria.  Genitourinary:  Positive for frequency and pelvic pain. Negative for dysuria, flank pain, vaginal discharge and vaginal pain.  Neurological:  Negative for headaches.     Physical Exam Triage Vital Signs ED Triage Vitals  Enc Vitals Group     BP 01/19/23 1755 (!) 142/96  Pulse Rate 01/19/23 1755 (!) 104     Resp 01/19/23 1755 20     Temp 01/19/23 1755 98.1 F (36.7 C)     Temp Source 01/19/23 1755 Oral     SpO2 01/19/23 1755 96 %     Weight --      Height --      Head Circumference --      Peak Flow --      Pain Score 01/19/23 1754 2     Pain Loc --      Pain Edu? --      Excl. in [place]? --    No data found.  Updated Vital Signs BP (!) 142/96 (BP Location: Right Arm)   Pulse (!) 104   Temp 98.1 F (36.7 C) (Oral)   Resp 20   LMP 01/03/2023 (Approximate)   SpO2 96%   Visual Acuity Right Eye Distance:   Left Eye Distance:   Bilateral Distance:    Right Eye Near:   Left Eye Near:    Bilateral Near:     Physical Exam Vitals and nursing note reviewed.  Constitutional:      General: She is not in acute distress.    Appearance: She is obese. She is not toxic-appearing.  HENT:     Head: Normocephalic.     Right Ear: External ear normal.     Left  Ear: External ear normal.  Eyes:     General: No scleral icterus.    Extraocular Movements: Extraocular movements intact.     Pupils: Pupils are equal, round, and reactive to light.  Pulmonary:     Effort: Pulmonary effort is normal.  Abdominal:     General: There is distension.     Palpations: There is no mass.     Tenderness: There is no abdominal tenderness. There is no right CVA tenderness, left CVA tenderness, guarding or rebound.  Musculoskeletal:        General: Normal range of motion.     Cervical back: Neck supple.  Skin:    General: Skin is warm and dry.  Neurological:     Mental Status: She is alert and oriented to person, place, and time.     Gait: Gait normal.  Psychiatric:        Mood and Affect: Mood normal.        Behavior: Behavior normal.        Thought Content: Thought content normal.        Judgment: Judgment normal.      UC Treatments / Results  Labs (all labs ordered are listed, but only abnormal results are displayed) Labs Reviewed  POCT URINALYSIS DIP (MANUAL ENTRY) - Abnormal; Notable for the following components:      Result Value   Glucose, UA >=1,000 (*)    Ketones, POC UA trace (5) (*)    Blood, UA trace-intact (*)    All other components within normal limits  POCT FASTING CBG [facility] MANUAL ENTRY - Abnormal; Notable for the following components:   POCT Glucose ([facility]) 246 (*)    All other components within normal limits  POCT URINE PREGNANCY  CERVICOVAGINAL ANCILLARY ONLY  Urine pregnancy test is negative  EKG   Radiology No results found.  Procedures Procedures (including critical care time)  Medications Ordered in UC Medications - No data to display  Initial Impression / Assessment and Plan / UC Course  I have reviewed the triage vital signs and the nursing notes.  Pertinent  labs results that were available during my care of the patient were reviewed by me and considered in my medical decision making (see chart for  details).  Uncontrolled DM type 2 HTN  She was advised to get eyes exams yearly Needs to do glucose diaries and take it to PCP in 2 weeks Needs to make a better effort to take her medication bid, and avoid sweet drinks. Vaginal swab sent out for STD testing and we will call her if positive.  See instructions.   Final Clinical Impressions(s) / UC Diagnoses   Final diagnoses:  Uncontrolled type 2 diabetes mellitus with hyperglycemia ([place])  Routine screening for STI (sexually transmitted infection)  Hypertension associated with diabetes [facility])     Discharge Instructions      Make an appointment to see this eye doctor and you should see them once a year. [facility] [address] [place],  [place]  [address]  [phone]  Make sure you take your Glipizide daily twice a day and check your glucose fasting which should be less than 100, and 2 hours after eating less than 140.  Take those readings to your primary care doctor in 2 weeks to see if he needs to increase your medication  Ask him to refer you to a nutritionist, so you can get help with your diet  Avoid juices, sweets or soda. Drink more water and walk 30-45 minutes a day  We will call you if the vaginal swab test comes back positive        ED Prescriptions   None    PDMP not reviewed this encounter.   Shelby Mattocks, PA-C 01/19/23 1853    Rodriguez-Southworth, Sunday Spillers, PA-C 01/19/23 1854

## 2023-01-19 NOTE — ED Triage Notes (Addendum)
Pt reports lower abd pain x3 days with intermittent emesis. LMP x2 weeks ago. LBM today. Denies any known fevers.  Pt also inquiring about vaginal swab for STD check just to be "safe". Denies any known exposures.

## 2023-01-20 LAB — CERVICOVAGINAL ANCILLARY ONLY
Bacterial Vaginitis (gardnerella): POSITIVE — AB
Candida Glabrata: NEGATIVE
Candida Vaginitis: NEGATIVE
Chlamydia: NEGATIVE
Comment: NEGATIVE
Comment: NEGATIVE
Comment: NEGATIVE
Comment: NEGATIVE
Comment: NEGATIVE
Comment: NORMAL
Neisseria Gonorrhea: NEGATIVE
Trichomonas: NEGATIVE

## 2023-01-21 ENCOUNTER — Telehealth (HOSPITAL_COMMUNITY): Payer: Self-pay | Admitting: Emergency Medicine

## 2023-01-21 MED ORDER — METRONIDAZOLE 500 MG PO TABS: 500.0000 mg | ORAL_TABLET | Freq: Two times a day (BID) | ORAL | 0 refills | Status: DC

## 2023-02-11 ENCOUNTER — Ambulatory Visit: Payer: Medicaid Other | Admitting: Obstetrics & Gynecology

## 2023-02-16 ENCOUNTER — Ambulatory Visit: Payer: Medicaid Other

## 2023-02-16 ENCOUNTER — Ambulatory Visit
Admission: RE | Admit: 2023-02-16 | Discharge: 2023-02-16 | Disposition: A | Discharge status: Home / Self Care | Payer: Medicaid Other | Source: Ambulatory Visit | Attending: Nurse Practitioner | Admitting: Nurse Practitioner

## 2023-02-16 VITALS — BP 154/101 | HR 80 | Temp 98.1°F | Resp 18

## 2023-02-16 DIAGNOSIS — N760 Acute vaginitis: Secondary | ICD-10-CM

## 2023-02-16 DIAGNOSIS — E1169 Type 2 diabetes mellitus with other specified complication: Secondary | ICD-10-CM

## 2023-02-16 MED ORDER — GLIPIZIDE 5 MG PO TABS: 5.0000 mg | ORAL_TABLET | Freq: Two times a day (BID) | ORAL | 0 refills | Status: DC

## 2023-02-16 NOTE — ED Provider Notes (Signed)
[facility]    CSN: [id number] Arrival date & time: 02/16/23  1254      History   Chief Complaint Chief Complaint  Patient presents with   Abdominal Pain    Entered by patient    HPI Mallory Mcdowell is a 37 y.o. female.   Patient presents today 2-day history of lower abdominal pain.  Reports "I think I have bacterial vaginosis."  She reports history of frequent BV infections that cause abdominal pain and eventually vaginal discharge.  She denies any current abdominal pain, discharge, vaginal odor.  No new rashes, lesions, or sores on her genitalia.  No dysuria, urinary frequency or urgency, hematuria, lymphadenopathy in the groin, nausea/vomiting, or fever.  No known exposures to STD, although reports "I do not trust my partner."  Reports she does have a history of cyst and fibroids and also wonders if this could be causing some of the intermittent abdominal pain.  Patient is also requesting refill for glipizide today.  Reports she ran out approximately 2 weeks ago.  She does not check her blood sugar at home.  Reports she knows that when her blood sugar is elevated, she can get a yeast infection.  Patient reports she is overdue for a follow-up with her primary care provider.  Reports she missed her appointment with them a couple of days ago and think she has another appointment scheduled in the near future.    Past Medical History:  Diagnosis Date   Diabetes mellitus without complication    Hypertension     Patient Active Problem List   Diagnosis Date Noted   Mixed hyperlipidemia 07/30/2022   Uterine leiomyoma 07/29/2022   Obesity (BMI 30-39.9) 07/29/2022   Essential hypertension 02/12/2022   Diabetes mellitus 02/09/2022   Tobacco abuse 02/09/2022    History reviewed. No pertinent surgical history.  OB History     Gravida  0   Para  0   Term  0   Preterm  0   AB  0   Living  0      SAB  0   IAB  0   Ectopic  0   Multiple  0   Live  Births               Home Medications    Prior to Admission medications   Medication Sig Start Date End Date Taking? Authorizing Provider  glipiZIDE (GLUCOTROL) 5 MG tablet Take 1 tablet (5 mg total) by mouth 2 (two) times daily before a meal. 02/16/23   Valentino Nose, NP    Family History Family History  Problem Relation Age of Onset   Hypertension Maternal Grandmother    Diabetes Maternal Grandmother    Asthma Mother    Diabetes Other    Heart failure Other    Cancer Other     Social History Social History   Tobacco Use   Smoking status: Every Day    Packs/day: .5    Types: Cigarettes   Smokeless tobacco: Never  Vaping Use   Vaping Use: Never used  Substance Use Topics   Alcohol use: Yes    Alcohol/week: 1.0 standard drink of alcohol    Types: 1 Cans of beer per week    Comment: sometimes daily, but not always   Drug use: Yes    Types: Marijuana     Allergies   Amoxicillin   Review of Systems Review of Systems Per HPI  Physical Exam Triage Vital Signs  ED Triage Vitals  Enc Vitals Group     BP 02/16/23 1259 (!) 154/101     Pulse Rate 02/16/23 1259 80     Resp 02/16/23 1259 18     Temp 02/16/23 1259 98.1 F (36.7 C)     Temp Source 02/16/23 1259 Oral     SpO2 02/16/23 1259 97 %     Weight --      Height --      Head Circumference --      Peak Flow --      Pain Score 02/16/23 1300 0     Pain Loc --      Pain Edu? --      Excl. in [place]? --    No data found.  Updated Vital Signs BP (!) 154/101 (BP Location: Right Arm)   Pulse 80   Temp 98.1 F (36.7 C) (Oral)   Resp 18   LMP 02/01/2023 (Approximate)   SpO2 97%   Visual Acuity Right Eye Distance:   Left Eye Distance:   Bilateral Distance:    Right Eye Near:   Left Eye Near:    Bilateral Near:     Physical Exam Vitals and nursing note reviewed.  Constitutional:      General: She is not in acute distress.    Appearance: Normal appearance. She is not toxic-appearing.   Pulmonary:     Effort: Pulmonary effort is normal. No respiratory distress.  Genitourinary:    Comments: Deferred-shared patient decision making utilized and self swab performed Skin:    General: Skin is warm and dry.     Coloration: Skin is not jaundiced or pale.     Findings: No erythema.  Neurological:     Mental Status: She is alert and oriented to person, place, and time.     Motor: No weakness.     Gait: Gait normal.  Psychiatric:        Mood and Affect: Mood normal.        Behavior: Behavior is cooperative.      UC Treatments / Results  Labs (all labs ordered are listed, but only abnormal results are displayed) Labs Reviewed  CERVICOVAGINAL ANCILLARY ONLY    EKG   Radiology No results found.  Procedures Procedures (including critical care time)  Medications Ordered in UC Medications - No data to display  Initial Impression / Assessment and Plan / UC Course  I have reviewed the triage vital signs and the nursing notes.  Pertinent labs & imaging results that were available during my care of the patient were reviewed by me and considered in my medical decision making (see chart for details).   Patient is well-appearing, afebrile, not tachycardic, not tachypneic, oxygenating well on room air.  Patient is mildly hypertensive in urgent care today.  1. Type 2 diabetes mellitus with other specified complication, without long-term current use of insulin Refill given for glipizide for 2 weeks Recommended follow-up with PCP within the next 2 weeks for reevaluation of diabetes  2. Acute vaginitis Self swab is pending to check for trichomonas, gonorrhea, chlamydia, BV, and yeast infection Patient declines HIV and syphilis testing today Will defer treatment till swab returns Recommended condom use with every sexual encounter moving forward  The patient was given the opportunity to ask questions.  All questions answered to their satisfaction.  The patient is in  agreement to this plan.    Final Clinical Impressions(s) / UC Diagnoses   Final diagnoses:  Type 2  diabetes mellitus with other specified complication, without long-term current use of insulin  Acute vaginitis     Discharge Instructions      We will contact you if any of the testing today comes back positive  Please use condoms with every sexual encounter to prevent STI  I have send about 2 weeks of glipizide supply to your pharmacy.   Please make an appointment with your PCP for follow up for future refills     ED Prescriptions     Medication Sig Dispense Auth. Provider   glipiZIDE (GLUCOTROL) 5 MG tablet Take 1 tablet (5 mg total) by mouth 2 (two) times daily before a meal. 30 tablet Valentino NoseMartinez, Nashali Ditmer A, NP      PDMP not reviewed this encounter.   Valentino NoseMartinez, Harshita Bernales A, NP 02/16/23 [phone]

## 2023-02-16 NOTE — Discharge Instructions (Addendum)
We will contact you if any of the testing today comes back positive  Please use condoms with every sexual encounter to prevent STI  I have send about 2 weeks of glipizide supply to your pharmacy.   Please make an appointment with your PCP for follow up for future refills

## 2023-02-16 NOTE — ED Triage Notes (Signed)
She she wants a vaginal swab done.  States history of vaginal infection.  States she needs a refill on her glipizide

## 2023-02-17 ENCOUNTER — Telehealth: Payer: Self-pay | Admitting: Emergency Medicine

## 2023-02-17 LAB — CERVICOVAGINAL ANCILLARY ONLY
Bacterial Vaginitis (gardnerella): POSITIVE — AB
Candida Glabrata: NEGATIVE
Candida Vaginitis: POSITIVE — AB
Chlamydia: NEGATIVE
Comment: NEGATIVE
Comment: NEGATIVE
Comment: NEGATIVE
Comment: NEGATIVE
Comment: NEGATIVE
Comment: NORMAL
Neisseria Gonorrhea: NEGATIVE
Trichomonas: NEGATIVE

## 2023-02-17 MED ORDER — FLUCONAZOLE 150 MG PO TABS: 150.0000 mg | ORAL_TABLET | Freq: Once | ORAL | 0 refills | Status: AC

## 2023-02-17 MED ORDER — METRONIDAZOLE 500 MG PO TABS: 500.0000 mg | ORAL_TABLET | Freq: Two times a day (BID) | ORAL | 0 refills | Status: DC

## 2023-03-02 ENCOUNTER — Telehealth: Payer: Self-pay

## 2023-03-02 MED ORDER — METRONIDAZOLE 500 MG PO TABS: 500.0000 mg | ORAL_TABLET | Freq: Two times a day (BID) | ORAL | 0 refills | Status: DC

## 2023-03-02 NOTE — Telephone Encounter (Signed)
Pt called stating that she is taking flagyl for Bv, when she was in the restroom taking her meds 2 of the pills fell into the toilet.

## 2023-03-04 ENCOUNTER — Ambulatory Visit (INDEPENDENT_AMBULATORY_CARE_PROVIDER_SITE_OTHER): Payer: Medicaid Other | Admitting: Obstetrics & Gynecology

## 2023-03-04 ENCOUNTER — Encounter: Payer: Self-pay | Admitting: Obstetrics & Gynecology

## 2023-03-04 VITALS — BP 143/93 | HR 79 | Ht 65.0 in | Wt 179.0 lb

## 2023-03-04 DIAGNOSIS — N920 Excessive and frequent menstruation with regular cycle: Secondary | ICD-10-CM

## 2023-03-04 DIAGNOSIS — Z7984 Long term (current) use of oral hypoglycemic drugs: Secondary | ICD-10-CM | POA: Diagnosis not present

## 2023-03-04 DIAGNOSIS — D259 Leiomyoma of uterus, unspecified: Secondary | ICD-10-CM

## 2023-03-04 DIAGNOSIS — E1165 Type 2 diabetes mellitus with hyperglycemia: Secondary | ICD-10-CM | POA: Diagnosis not present

## 2023-03-04 MED ORDER — NORETHINDRONE 0.35 MG PO TABS: 1.0000 | ORAL_TABLET | Freq: Every day | ORAL | 11 refills | Status: DC

## 2023-03-04 NOTE — Progress Notes (Signed)
Follow up appointment for results: Glucose control  Chief Complaint  Patient presents with   Follow-up    Wants to talk about her fibroids.     Blood pressure (!) 143/93, pulse 79, height  (1.651 m), weight 179 lb (81.2 kg), last menstrual period 03/01/2023.  No results found.  Pt has not really been checking her sugars but her A1C is better, I have her on glipizide 5 BID without problems  She is interested in having a myomectomy so I have contacted Dr Briscoe Deutscher and would like to do it robotically, I would like to arrange to be there as well   MEDS ordered this encounter: Meds ordered this encounter  Medications   norethindrone (MICRONOR) 0.35 MG tablet    Sig: Take 1 tablet (0.35 mg total) by mouth daily.    Dispense:  28 tablet    Refill:  11    Orders for this encounter: No orders of the defined types were placed in this encounter.   Impression + Management Plan   ICD-10-CM   1. Uterine leiomyoma, sonogram 05/2020-->519 grams  D25.9     2. Poorly controlled type 2 diabetes mellitus  E11.65     3. Menorrhagia with regular cycle  N92.0       Follow Up: Return if symptoms worsen or fail to improve, for arranging robotic myomectomy with Dr Briscoe Deutscher.  Will use micronor in meantime to try and manage cycle    All questions were answered.  Past Medical History:  Diagnosis Date   Diabetes mellitus without complication    Hypertension     Current Outpatient Medications:    glipiZIDE (GLUCOTROL) 5 MG tablet, Take 1 tablet (5 mg total) by mouth 2 (two) times daily before a meal., Disp: 30 tablet, Rfl: 0   metroNIDAZOLE (FLAGYL) 500 MG tablet, Take 1 tablet (500 mg total) by mouth 2 (two) times daily. (Patient not taking: Reported on 03/04/2023), Disp: 2 tablet, Rfl: 0   norethindrone (MICRONOR) 0.35 MG tablet, Take 1 tablet (0.35 mg total) by mouth daily., Disp: 28 tablet, Rfl: 11  History reviewed. No pertinent surgical history.  OB History     Gravida  0    Para  0   Term  0   Preterm  0   AB  0   Living  0      SAB  0   IAB  0   Ectopic  0   Multiple  0   Live Births              Allergies  Allergen Reactions   Amoxicillin Swelling and Anaphylaxis    Social History   Socioeconomic History   Marital status: Single    Spouse name: Not on file   Number of children: Not on file   Years of education: Not on file   Highest education level: Not on file  Occupational History   Not on file  Tobacco Use   Smoking status: Every Day    Packs/day: .5    Types: Cigarettes   Smokeless tobacco: Never  Vaping Use   Vaping Use: Never used  Substance and Sexual Activity   Alcohol use: Yes    Alcohol/week: 1.0 standard drink of alcohol    Types: 1 Cans of beer per week    Comment: sometimes daily, but not always   Drug use: Yes    Types: Marijuana   Sexual activity: Yes    Birth control/protection: None  Other Topics  Concern   Not on file  Social History Narrative   Not on file   Social Determinants of Health   Financial Resource Strain: Low Risk  (12/31/2022)   Overall Financial Resource Strain (CARDIA)    Difficulty of Paying Living Expenses: Not hard at all  Food Insecurity: No Food Insecurity (12/31/2022)   Hunger Vital Sign    Worried About Running Out of Food in the Last Year: Never true    Ran Out of Food in the Last Year: Never true  Transportation Needs: No Transportation Needs (12/31/2022)   PRAPARE - Administrator, Civil Service (Medical): No    Lack of Transportation (Non-Medical): No  Physical Activity: Insufficiently Active (12/31/2022)   Exercise Vital Sign    Days of Exercise per Week: 5 days    Minutes of Exercise per Session: 10 min  Stress: Stress Concern Present (12/31/2022)   [employer] of Occupational Health - Occupational Stress Questionnaire    Feeling of Stress : Rather much  Social Connections: Moderately Integrated (12/31/2022)   Social Connection and Isolation  Panel [NHANES]    Frequency of Communication with Friends and Family: More than three times a week    Frequency of Social Gatherings with Friends and Family: Once a week    Attends Religious Services: 1 to 4 times per year    Active Member of [employer] or Organizations: Yes    Attends Banker Meetings: 1 to 4 times per year    Marital Status: Never married    Family History  Problem Relation Age of Onset   Hypertension Maternal Grandmother    Diabetes Maternal Grandmother    Asthma Mother    Diabetes Other    Heart failure Other    Cancer Other

## 2023-03-12 ENCOUNTER — Telehealth: Payer: Self-pay

## 2023-03-12 NOTE — Progress Notes (Signed)
   Care Guide Note  03/12/2023 Name: Mallory Mcdowell MRN: [id number] DOB: 1986/07/26  Referred by: Anabel Halon, MD Reason for referral : Care Coordination (Outreach to schedule with Pharm d NEW MM DM )   Mallory Mcdowell is a 37 y.o. year old female who is a primary care patient of Anabel Halon, MD. [name] was referred to the pharmacist for assistance related to DM.    Successful contact was made with the patient to discuss pharmacy services. Patient declines engagement at this time. Contact information was provided to the patient should they wish to reach out for assistance at a later time.  [name], RMA Care Guide [facility]  [place], [place] [address] Direct Dial: [phone] [name]

## 2023-03-20 ENCOUNTER — Ambulatory Visit: Payer: Medicaid Other

## 2023-03-21 ENCOUNTER — Ambulatory Visit: Payer: Medicaid Other

## 2023-03-22 ENCOUNTER — Ambulatory Visit
Admission: RE | Admit: 2023-03-22 | Discharge: 2023-03-22 | Disposition: A | Discharge status: Home / Self Care | Payer: Medicaid Other | Source: Ambulatory Visit | Attending: Nurse Practitioner | Admitting: Nurse Practitioner

## 2023-03-22 VITALS — BP 135/93 | HR 92 | Temp 98.3°F | Resp 17

## 2023-03-22 DIAGNOSIS — N760 Acute vaginitis: Secondary | ICD-10-CM | POA: Diagnosis not present

## 2023-03-22 LAB — POCT URINALYSIS DIP (MANUAL ENTRY)
Bilirubin, UA: NEGATIVE
Blood, UA: NEGATIVE
Glucose, UA: NEGATIVE mg/dL
Ketones, POC UA: NEGATIVE mg/dL
Leukocytes, UA: NEGATIVE
Nitrite, UA: NEGATIVE
Protein Ur, POC: NEGATIVE mg/dL
Spec Grav, UA: >=1.03 — AB (ref 1.010–1.025)
Urobilinogen, UA: 0.2 E.U./dL
pH, UA: 5.5 (ref 5.0–8.0)

## 2023-03-22 MED ORDER — METRONIDAZOLE 500 MG PO TABS: 500.0000 mg | ORAL_TABLET | Freq: Two times a day (BID) | ORAL | 0 refills | Status: AC

## 2023-03-22 MED ORDER — FLUCONAZOLE 150 MG PO TABS: 150.0000 mg | ORAL_TABLET | Freq: Every day | ORAL | 0 refills | Status: DC

## 2023-03-22 NOTE — ED Triage Notes (Signed)
Pt states she is having a fishy vaginal odor that started 3 days ago with frequent urinating with lower back and abdominal pain.

## 2023-03-22 NOTE — ED Provider Notes (Signed)
[facility]    CSN: [id number] Arrival date & time: 03/22/23  1259      History   Chief Complaint Chief Complaint  Patient presents with   Urinary Frequency    HPI Mallory Mcdowell is a 37 y.o. female.   Patient presents today with a few day history of vaginal odor and discharge.  She describes the discharge is thin and white, smells a bit fishy.  No thick, clumpy discharge.  No vaginal itching, sores, rashes, or new lesions.  No new pelvic pain, abdominal pain, dysuria, urinary frequency or urgency, foul urinary odor, or nausea/vomiting.  No fevers or new back pain.  Patient reports a few weeks ago, she was treated for BV and thinks this has recurred.  Reports that she is currently sexually active and is willing to have STI testing today.  Patient declines HIV and syphilis testing.  Denies known exposures to STI.    Past Medical History:  Diagnosis Date   Diabetes mellitus without complication (HCC)    Hypertension     Patient Active Problem List   Diagnosis Date Noted   Mixed hyperlipidemia 07/30/2022   Uterine leiomyoma 07/29/2022   Obesity (BMI 30-39.9) 07/29/2022   Essential hypertension 02/12/2022   Diabetes mellitus (HCC) 02/09/2022   Tobacco abuse 02/09/2022    History reviewed. No pertinent surgical history.  OB History     Gravida  0   Para  0   Term  0   Preterm  0   AB  0   Living  0      SAB  0   IAB  0   Ectopic  0   Multiple  0   Live Births               Home Medications    Prior to Admission medications   Medication Sig Start Date End Date Taking? Authorizing Provider  fluconazole (DIFLUCAN) 150 MG tablet Take 1 tablet (150 mg total) by mouth daily. 03/22/23  Yes Valentino Nose, NP  glipiZIDE (GLUCOTROL) 5 MG tablet Take 1 tablet (5 mg total) by mouth 2 (two) times daily before a meal. 02/16/23  Yes Valentino Nose, NP  norethindrone (MICRONOR) 0.35 MG tablet Take 1 tablet (0.35 mg total) by mouth  daily. 03/04/23  Yes Lazaro Arms, MD  metroNIDAZOLE (FLAGYL) 500 MG tablet Take 1 tablet (500 mg total) by mouth 2 (two) times daily for 7 days. 03/22/23 03/29/23  Valentino Nose, NP    Family History Family History  Problem Relation Age of Onset   Hypertension Maternal Grandmother    Diabetes Maternal Grandmother    Asthma Mother    Diabetes Other    Heart failure Other    Cancer Other     Social History Social History   Tobacco Use   Smoking status: Every Day    Packs/day: .5    Types: Cigarettes   Smokeless tobacco: Never  Vaping Use   Vaping Use: Never used  Substance Use Topics   Alcohol use: Yes    Alcohol/week: 1.0 standard drink of alcohol    Types: 1 Cans of beer per week    Comment: sometimes daily, but not always   Drug use: Yes    Types: Marijuana     Allergies   Amoxicillin   Review of Systems Review of Systems Per HPI  Physical Exam Triage Vital Signs ED Triage Vitals [03/22/23 1349]  Enc Vitals Group  BP (!) 135/93     Pulse Rate 92     Resp 17     Temp 98.3 F (36.8 C)     Temp Source Oral     SpO2 99 %     Weight      Height      Head Circumference      Peak Flow      Pain Score 0     Pain Loc      Pain Edu?      Excl. in GC?    No data found.  Updated Vital Signs BP (!) 135/93 (BP Location: Right Arm)   Pulse 92   Temp 98.3 F (36.8 C) (Oral)   Resp 17   LMP 03/01/2023 (Approximate)   SpO2 99%   Visual Acuity Right Eye Distance:   Left Eye Distance:   Bilateral Distance:    Right Eye Near:   Left Eye Near:    Bilateral Near:     Physical Exam Vitals and nursing note reviewed.  Constitutional:      General: She is not in acute distress.    Appearance: Normal appearance. She is not toxic-appearing.  Pulmonary:     Effort: Pulmonary effort is normal. No respiratory distress.  Genitourinary:    Comments: Deferred-self swab performed Skin:    General: Skin is warm and dry.     Coloration: Skin is not  jaundiced or pale.     Findings: No erythema.  Neurological:     Mental Status: She is alert and oriented to person, place, and time.     Motor: No weakness.     Gait: Gait normal.  Psychiatric:        Behavior: Behavior is cooperative.      UC Treatments / Results  Labs (all labs ordered are listed, but only abnormal results are displayed) Labs Reviewed  POCT URINALYSIS DIP (MANUAL ENTRY) - Abnormal; Notable for the following components:      Result Value   Spec Grav, UA >=1.030 (*)    All other components within normal limits  CERVICOVAGINAL ANCILLARY ONLY    EKG   Radiology No results found.  Procedures Procedures (including critical care time)  Medications Ordered in UC Medications - No data to display  Initial Impression / Assessment and Plan / UC Course  I have reviewed the triage vital signs and the nursing notes.  Pertinent labs & imaging results that were available during my care of the patient were reviewed by me and considered in my medical decision making (see chart for details).   Patient is well-appearing, normotensive, afebrile, not tachycardic, not tachypneic, oxygenating well on room air.    1. Acute vaginitis Symptoms are consistent with bacterial vaginosis Urinalysis is not indicative of UTI today Will treat with metronidazole 500 mg twice daily for 7 days Patient requested Diflucan if she develops signs of yeast infection after antibiotic treatment and prescription given Self swab is pending-treat as indicated if anything other than BV/yeast is positive Supportive care discussed ER and return precautions also discussed   The patient was given the opportunity to ask questions.  All questions answered to their satisfaction.  The patient is in agreement to this plan.    Final Clinical Impressions(s) / UC Diagnoses   Final diagnoses:  Acute vaginitis     Discharge Instructions      Please take the Flagyl as prescribed to treat possible  BV.  We will call you if the results  show a vaginal infection other than BV that requires treatment.  Follow up with OB/GYN if symptoms persist or worsen despite treatment.     ED Prescriptions     Medication Sig Dispense Auth. Provider   metroNIDAZOLE (FLAGYL) 500 MG tablet Take 1 tablet (500 mg total) by mouth 2 (two) times daily for 7 days. 14 tablet Cathlean Marseilles A, NP   fluconazole (DIFLUCAN) 150 MG tablet Take 1 tablet (150 mg total) by mouth daily. 1 tablet Valentino Nose, NP      PDMP not reviewed this encounter.   Valentino Nose, NP 03/22/23 1422

## 2023-03-22 NOTE — Discharge Instructions (Addendum)
Please take the Flagyl as prescribed to treat possible BV.  We will call you if the results show a vaginal infection other than BV that requires treatment.  Follow up with OB/GYN if symptoms persist or worsen despite treatment.

## 2023-03-23 LAB — CERVICOVAGINAL ANCILLARY ONLY
Bacterial Vaginitis (gardnerella): POSITIVE — AB
Candida Glabrata: NEGATIVE
Candida Vaginitis: NEGATIVE
Chlamydia: NEGATIVE
Comment: NEGATIVE
Comment: NEGATIVE
Comment: NEGATIVE
Comment: NEGATIVE
Comment: NEGATIVE
Comment: NORMAL
Neisseria Gonorrhea: NEGATIVE
Trichomonas: NEGATIVE

## 2023-04-08 ENCOUNTER — Other Ambulatory Visit: Payer: Self-pay

## 2023-04-08 ENCOUNTER — Encounter: Payer: Self-pay | Admitting: Obstetrics and Gynecology

## 2023-04-08 ENCOUNTER — Ambulatory Visit (INDEPENDENT_AMBULATORY_CARE_PROVIDER_SITE_OTHER): Payer: Medicaid Other | Admitting: Obstetrics and Gynecology

## 2023-04-08 VITALS — BP 154/106 | HR 81 | Resp 16 | Ht 65.0 in | Wt 173.4 lb

## 2023-04-08 DIAGNOSIS — D219 Benign neoplasm of connective and other soft tissue, unspecified: Secondary | ICD-10-CM

## 2023-04-08 NOTE — Progress Notes (Signed)
GYNECOLOGY VISIT  Patient name: MADYLAN BIGNESS MRN [id number]  Date of birth: Aug 05, 1986 Chief Complaint:   surgery consult  History:  TARAANN BOGUSZ is a 37 y.o. G0P0000 being seen today for discussion of possible myomectomy.    Menses are heavy and painful. Most recent had a lot of clots. No prior pregnancies. Wanting to get pregnant prior to age 82.  Prior partner she wa with 13 years and did not get pregnant, current partner recently incarcerated, but has multiple children  No prior surgeries at all Struggling with DM control but it's improving slowly  Past Medical History:  Diagnosis Date   Diabetes mellitus without complication (HCC)    Hypertension     No past surgical history on file.  The following portions of the patient's history were reviewed and updated as appropriate: allergies, current medications, past family history, past medical history, past social history, past surgical history and problem list.   Health Maintenance:   Last pap     Component Value Date/Time   DIAGPAP  12/31/2022 1004    - Negative for intraepithelial lesion or malignancy (NILM)   DIAGPAP  05/08/2020 1141    - Negative for intraepithelial lesion or malignancy (NILM)   DIAGPAP  12/24/2016 0000    NEGATIVE FOR INTRAEPITHELIAL LESIONS OR MALIGNANCY.   HPVHIGH Negative 12/31/2022 1004   HPVHIGH Negative 05/08/2020 1141   ADEQPAP  12/31/2022 1004    Satisfactory for evaluation; transformation zone component PRESENT.   ADEQPAP  05/08/2020 1141    Satisfactory for evaluation; transformation zone component PRESENT.   ADEQPAP  12/24/2016 0000    Satisfactory for evaluation  endocervical/transformation zone component PRESENT.    High Risk HPV: Positive  Adequacy:  Satisfactory for evaluation, transformation zone component PRESENT  Diagnosis:  Atypical squamous cells of undetermined significance (ASC-US)  Last mammogram: n/a   Review of Systems:  Pertinent items are noted in  HPI. Comprehensive review of systems was otherwise negative.   Objective:  Physical Exam BP (!) 150/101   Pulse 81   Resp 16   Ht 5\' 5"  (1.651 m)   Wt 173 lb 6.4 oz (78.7 kg)   LMP 03/28/2023   BMI 28.86 kg/m    Physical Exam Vitals and nursing note reviewed.  Constitutional:      Appearance: Normal appearance.  HENT:     Head: Normocephalic and atraumatic.  Pulmonary:     Effort: Pulmonary effort is normal.  Abdominal:     Comments: Enlarged uterus  Skin:    General: Skin is warm and dry.  Neurological:     General: No focal deficit present.     Mental Status: She is alert.  Psychiatric:        Mood and Affect: Mood normal.        Behavior: Behavior normal.        Thought Content: Thought content normal.        Judgment: Judgment normal.       Assessment & Plan:   1. Fibroid Discussed preoperative optimization - improvement in A1c with goal of less than 8 Recommend pelvic MRI for surgical planning. Prior US demonstrates 9.5cm fibroid and 5cm fibroid. Discussed that if fibroids are larger, abdominal approach may be better. Reviewed need for interval conception and that removal does not guarantee conception. Additionally, noted that with myomectomy there is small risk of hysterectomy as well. - Basic Metabolic Panel (BMET) - MR PELVIS W WO CONTRAST; Future    Kandace Elrod  Briscoe Deutscher, MD [facility], [facility]

## 2023-04-09 ENCOUNTER — Other Ambulatory Visit: Payer: Self-pay | Admitting: Nurse Practitioner

## 2023-04-09 DIAGNOSIS — E1169 Type 2 diabetes mellitus with other specified complication: Secondary | ICD-10-CM

## 2023-04-12 ENCOUNTER — Ambulatory Visit: Payer: Medicaid Other

## 2023-04-13 ENCOUNTER — Ambulatory Visit
Admission: RE | Admit: 2023-04-13 | Discharge: 2023-04-13 | Disposition: A | Discharge status: Home / Self Care | Payer: Medicaid Other | Source: Ambulatory Visit | Attending: Nurse Practitioner | Admitting: Nurse Practitioner

## 2023-04-13 VITALS — BP 127/76 | HR 84 | Temp 98.6°F | Resp 18

## 2023-04-13 DIAGNOSIS — R102 Pelvic and perineal pain: Secondary | ICD-10-CM | POA: Insufficient documentation

## 2023-04-13 DIAGNOSIS — H6121 Impacted cerumen, right ear: Secondary | ICD-10-CM | POA: Insufficient documentation

## 2023-04-13 DIAGNOSIS — Z113 Encounter for screening for infections with a predominantly sexual mode of transmission: Secondary | ICD-10-CM | POA: Insufficient documentation

## 2023-04-13 LAB — POCT URINALYSIS DIP (MANUAL ENTRY)
Bilirubin, UA: NEGATIVE
Glucose, UA: NEGATIVE mg/dL
Ketones, POC UA: NEGATIVE mg/dL
Leukocytes, UA: NEGATIVE
Nitrite, UA: NEGATIVE
Protein Ur, POC: NEGATIVE mg/dL
Spec Grav, UA: >=1.03 — AB (ref 1.010–1.025)
Urobilinogen, UA: 0.2 E.U./dL
pH, UA: 5 (ref 5.0–8.0)

## 2023-04-13 NOTE — ED Triage Notes (Signed)
Pt reports she took the BV pills that were prescribed around 5/13 and now she feels she has a yeast infection. Pt states she thinks its a yeast infection or BV because she is hurting in her insides.

## 2023-04-13 NOTE — ED Provider Notes (Signed)
[facility]    CSN: [id number] Arrival date & time: 04/13/23  1150      History   Chief Complaint Chief Complaint  Patient presents with   Abdominal Pain    And ear infections - Entered by patient    HPI Mallory Mcdowell is a 37 y.o. female.   The history is provided by the patient.   Patient presents for complaints of "pain on her insides" has been present for the past several days.  Patient states that she has pain around her vaginal area.  She denies pain with urination, vaginal lesions or sores, vaginal swelling, vaginal discharge, vaginal odor, vaginal itching, new pelvic pain, abdominal pain, dysuria, urinary frequency or urgency, foul urinary odor, or nausea/vomiting. No fevers or new back pain. Patient reports a few weeks ago, she was treated for BV 2 to 3 weeks ago, but states that her symptoms returned approximately 2 days later.  She reports that she has not had sexual intercourse since 03/29/2023, since her partner has been locked up.  Last menstrual cycle was on 03/28/2023.  She is currently under the care of of gynecology for fibroids.  She states that she is waiting to have an ultrasound to determine what kind of surgery she may need.  She also complains of right ear pain has been present for the past month.  She denies fever, chills, headache, ear drainage, sore throat, or upper respiratory symptoms.  States that she has been using a Q-tip to clean her ears.  Past Medical History:  Diagnosis Date   Diabetes mellitus without complication (HCC)    Hypertension     Patient Active Problem List   Diagnosis Date Noted   Mixed hyperlipidemia 07/30/2022   Uterine leiomyoma 07/29/2022   Obesity (BMI 30-39.9) 07/29/2022   Essential hypertension 02/12/2022   Diabetes mellitus (HCC) 02/09/2022   Tobacco abuse 02/09/2022    History reviewed. No pertinent surgical history.  OB History     Gravida  0   Para  0   Term  0   Preterm  0   AB  0   Living   0      SAB  0   IAB  0   Ectopic  0   Multiple  0   Live Births               Home Medications    Prior to Admission medications   Medication Sig Start Date End Date Taking? Authorizing Provider  lisinopril (ZESTRIL) 10 MG tablet Take 10 mg by mouth daily. 04/09/23  Yes [provider]  fluconazole (DIFLUCAN) 150 MG tablet Take 1 tablet (150 mg total) by mouth daily. Patient not taking: Reported on 04/08/2023 03/22/23   Valentino Nose, NP  glipiZIDE (GLUCOTROL) 5 MG tablet Take 1 tablet (5 mg total) by mouth 2 (two) times daily before a meal. 04/12/23   Anabel Halon, MD  metFORMIN (GLUCOPHAGE) 1000 MG tablet Take 1,000 mg by mouth 2 (two) times daily with a meal.    [provider]  norethindrone (MICRONOR) 0.35 MG tablet Take 1 tablet (0.35 mg total) by mouth daily. Patient not taking: Reported on 04/08/2023 03/04/23   Lazaro Arms, MD    Family History Family History  Problem Relation Age of Onset   Hypertension Maternal Grandmother    Diabetes Maternal Grandmother    Asthma Mother    Diabetes Other    Heart failure Other    Cancer  Other     Social History Social History   Tobacco Use   Smoking status: Every Day    Packs/day: .5    Types: Cigarettes   Smokeless tobacco: Never  Vaping Use   Vaping Use: Never used  Substance Use Topics   Alcohol use: Yes    Alcohol/week: 1.0 standard drink of alcohol    Types: 1 Cans of beer per week    Comment: sometimes daily, but not always   Drug use: Yes    Types: Marijuana     Allergies   Amoxicillin   Review of Systems Review of Systems Per HPI  Physical Exam Triage Vital Signs ED Triage Vitals  Enc Vitals Group     BP 04/13/23 1208 127/76     Pulse Rate 04/13/23 1208 84     Resp 04/13/23 1208 18     Temp 04/13/23 1208 98.6 F (37 C)     Temp Source 04/13/23 1208 Oral     SpO2 04/13/23 1208 98 %     Weight --      Height --      Head Circumference --      Peak Flow --       Pain Score 04/13/23 1209 0     Pain Loc --      Pain Edu? --      Excl. in GC? --    No data found.  Updated Vital Signs BP 127/76 (BP Location: Right Arm)   Pulse 84   Temp 98.6 F (37 C) (Oral)   Resp 18   LMP 03/28/2023   SpO2 98%   Visual Acuity Right Eye Distance:   Left Eye Distance:   Bilateral Distance:    Right Eye Near:   Left Eye Near:    Bilateral Near:     Physical Exam Vitals and nursing note reviewed.  Constitutional:      General: She is not in acute distress.    Appearance: She is well-developed.  HENT:     Head: Normocephalic.     Right Ear: Ear canal and external ear normal. There is impacted cerumen.     Left Ear: Tympanic membrane, ear canal and external ear normal.     Nose: Nose normal.     Mouth/Throat:     Mouth: Mucous membranes are moist.  Eyes:     Extraocular Movements: Extraocular movements intact.     Pupils: Pupils are equal, round, and reactive to light.  Pulmonary:     Effort: Pulmonary effort is normal.     Breath sounds: Normal breath sounds.  Abdominal:     General: Bowel sounds are normal.     Tenderness: There is abdominal tenderness in the suprapubic area.  Genitourinary:    Comments: GU exam deferred, self swab performed  Skin:    General: Skin is warm and dry.  Neurological:     General: No focal deficit present.     Mental Status: She is alert and oriented to person, place, and time.  Psychiatric:        Mood and Affect: Mood normal.        Behavior: Behavior normal.      UC Treatments / Results  Labs (all labs ordered are listed, but only abnormal results are displayed) Labs Reviewed  POCT URINALYSIS DIP (MANUAL ENTRY) - Abnormal; Notable for the following components:      Result Value   Spec Grav, UA >=1.030 (*)    Blood, UA  trace-intact (*)    All other components within normal limits  CERVICOVAGINAL ANCILLARY ONLY    EKG   Radiology No results found.  Procedures Procedures (including  critical care time)  Medications Ordered in UC Medications - No data to display  Initial Impression / Assessment and Plan / UC Course  I have reviewed the triage vital signs and the nursing notes.  Pertinent labs & imaging results that were available during my care of the patient were reviewed by me and considered in my medical decision making (see chart for details).  The patient is well-appearing, she is in no acute distress, vital signs are stable.  Urinalysis shows trace blood and elevated specific gravity.  Urine culture is pending along with cytology results.  Patient's symptoms are nonspecific, and do not indicate a clear diagnosis of BV or yeast.  Will wait for cytology results prior to treatment.  Patient was advised that if the cytology swab is positive for BV, recommend that she be prescribed MetroGel at this time.  Patient was advised to continue to follow-up with gynecology for continued treatment of her fibroids.  Patient advised to refrain from sexual intercourse until her STI test results have been received.  Patient is in agreement with this plan of care and verbalizes understanding.  All questions were answered.  Patient stable for discharge.  Final Clinical Impressions(s) / UC Diagnoses   Final diagnoses:  Screening examination for sexually transmitted disease   Discharge Instructions   None    ED Prescriptions   None    PDMP not reviewed this encounter.   Abran Cantor, NP 04/13/23 1416

## 2023-04-13 NOTE — Discharge Instructions (Signed)
The urinalysis is negative for urinary tract infection.  Urine culture is pending along with your cytology results.  Once your cytology results are received, you will be contacted if treatment is recommended. Warm sitz bath 1-2 times daily as needed for vaginal pain or discomfort. If your symptoms continue to persist, and your test results are negative, please follow-up with gynecology for further evaluation.  For your ear: Recommend discontinuing use of Q-tips when cleaning the ears.  You can purchase an over-the-counter earwax softener such as Debrox to apply to the ear 2-3 times weekly to keep earwax soft. May take over-the-counter Tylenol or ibuprofen as needed for pain or discomfort. Warm compresses to the right ear as needed for pain or discomfort.  Follow-up as needed.

## 2023-04-14 LAB — CERVICOVAGINAL ANCILLARY ONLY
Bacterial Vaginitis (gardnerella): POSITIVE — AB
Candida Glabrata: NEGATIVE
Candida Vaginitis: NEGATIVE
Chlamydia: NEGATIVE
Comment: NEGATIVE
Comment: NEGATIVE
Comment: NEGATIVE
Comment: NEGATIVE
Comment: NEGATIVE
Comment: NORMAL
Neisseria Gonorrhea: NEGATIVE
Trichomonas: NEGATIVE

## 2023-04-15 ENCOUNTER — Telehealth (HOSPITAL_COMMUNITY): Payer: Self-pay | Admitting: Emergency Medicine

## 2023-04-15 MED ORDER — METRONIDAZOLE 500 MG PO TABS: 500.0000 mg | ORAL_TABLET | Freq: Two times a day (BID) | ORAL | 0 refills | Status: DC

## 2023-04-20 ENCOUNTER — Other Ambulatory Visit: Payer: Self-pay | Admitting: Obstetrics and Gynecology

## 2023-04-20 DIAGNOSIS — D219 Benign neoplasm of connective and other soft tissue, unspecified: Secondary | ICD-10-CM

## 2023-04-20 DIAGNOSIS — E1169 Type 2 diabetes mellitus with other specified complication: Secondary | ICD-10-CM

## 2023-04-20 NOTE — Addendum Note (Signed)
Addended by: Harlon Ditty on: 04/20/2023 12:53 PM   Modules accepted: Orders

## 2023-04-21 ENCOUNTER — Telehealth: Payer: Self-pay

## 2023-04-21 NOTE — Telephone Encounter (Signed)
-----   Message from Lorriane Shire, MD sent at 04/20/2023 12:49 PM EDT ----- Regarding: lab visit Hi,  Patient needs a lab visit for a serum creatinine prior to MRI. I'm not sure if it's easier for [facility] to come to [facility] but I'm not sure they will do MRI without a recent creatinine on board.  Thanks,  Ajewole

## 2023-04-21 NOTE — Telephone Encounter (Signed)
Left message for patient to return call to office. Garry Bochicchio  RN 

## 2023-05-04 ENCOUNTER — Ambulatory Visit (HOSPITAL_COMMUNITY)
Admission: RE | Admit: 2023-05-04 | Discharge: 2023-05-04 | Disposition: A | Discharge status: Home / Self Care | Payer: Medicaid Other | Source: Ambulatory Visit | Attending: Obstetrics and Gynecology | Admitting: Obstetrics and Gynecology

## 2023-05-04 DIAGNOSIS — D219 Benign neoplasm of connective and other soft tissue, unspecified: Secondary | ICD-10-CM | POA: Insufficient documentation

## 2023-05-04 DIAGNOSIS — D251 Intramural leiomyoma of uterus: Secondary | ICD-10-CM | POA: Diagnosis not present

## 2023-05-04 MED ORDER — GADOBUTROL 1 MMOL/ML IV SOLN
10.0000 mL | Freq: Once | INTRAVENOUS | Status: AC | PRN
  Administered 2023-05-04: 8 mL via INTRAVENOUS

## 2023-05-06 ENCOUNTER — Inpatient Hospital Stay: Admission: RE | Admit: 2023-05-06 | Payer: Medicaid Other | Source: Ambulatory Visit

## 2023-05-06 ENCOUNTER — Other Ambulatory Visit: Payer: Self-pay | Admitting: Internal Medicine

## 2023-05-06 DIAGNOSIS — E1169 Type 2 diabetes mellitus with other specified complication: Secondary | ICD-10-CM

## 2023-05-07 NOTE — Addendum Note (Signed)
Addended by: Harlon Ditty on: 05/07/2023 12:54 PM   Modules accepted: Orders

## 2023-05-10 ENCOUNTER — Encounter: Payer: Self-pay | Admitting: *Deleted

## 2023-05-10 ENCOUNTER — Telehealth: Payer: Self-pay | Admitting: *Deleted

## 2023-05-10 DIAGNOSIS — E1169 Type 2 diabetes mellitus with other specified complication: Secondary | ICD-10-CM

## 2023-05-10 NOTE — Telephone Encounter (Addendum)
-----   Message from Lorriane Shire, MD sent at 05/07/2023 12:54 PM EDT ----- MRI shows stable fibroids. Needs repeat A1c before surgery can be scheduled  7/1  1135  Called pt to discuss results and plan of care. She did not answer and voicemail was full. Mychart message sent to pt and lab appt was scheduled on 7/3 @ 10:00 am.

## 2023-05-12 ENCOUNTER — Other Ambulatory Visit: Payer: Self-pay

## 2023-05-12 ENCOUNTER — Other Ambulatory Visit: Payer: Medicaid Other

## 2023-05-12 DIAGNOSIS — E1169 Type 2 diabetes mellitus with other specified complication: Secondary | ICD-10-CM

## 2023-05-13 LAB — HEMOGLOBIN A1C
Est. average glucose Bld gHb Est-mCnc: 197 mg/dL
Hgb A1c MFr Bld: 8.5 % — ABNORMAL HIGH (ref 4.8–5.6)

## 2023-06-02 ENCOUNTER — Ambulatory Visit: Payer: Medicaid Other

## 2023-06-03 ENCOUNTER — Ambulatory Visit: Payer: Medicaid Other

## 2023-06-06 ENCOUNTER — Ambulatory Visit: Payer: Medicaid Other

## 2023-06-09 ENCOUNTER — Ambulatory Visit: Payer: Medicaid Other

## 2023-06-10 ENCOUNTER — Ambulatory Visit: Payer: Medicaid Other

## 2023-06-12 ENCOUNTER — Ambulatory Visit
Admission: RE | Admit: 2023-06-12 | Discharge: 2023-06-12 | Disposition: A | Discharge status: Home / Self Care | Payer: Medicaid Other | Source: Ambulatory Visit | Attending: Nurse Practitioner | Admitting: Nurse Practitioner

## 2023-06-12 VITALS — BP 132/85 | HR 86 | Temp 98.3°F | Resp 16

## 2023-06-12 DIAGNOSIS — Z113 Encounter for screening for infections with a predominantly sexual mode of transmission: Secondary | ICD-10-CM

## 2023-06-12 DIAGNOSIS — N898 Other specified noninflammatory disorders of vagina: Secondary | ICD-10-CM

## 2023-06-12 DIAGNOSIS — Z760 Encounter for issue of repeat prescription: Secondary | ICD-10-CM

## 2023-06-12 MED ORDER — LISINOPRIL 10 MG PO TABS: 10.0000 mg | ORAL_TABLET | Freq: Every day | ORAL | 0 refills | Status: DC

## 2023-06-12 MED ORDER — GLIPIZIDE 5 MG PO TABS: 5.0000 mg | ORAL_TABLET | Freq: Two times a day (BID) | ORAL | 0 refills | Status: DC

## 2023-06-12 NOTE — ED Provider Notes (Signed)
[facility]    CSN: [id number] Arrival date & time: 06/12/23  1249      History   Chief Complaint Chief Complaint  Patient presents with   Vaginal Discharge    Entered by patient    HPI Mallory Mcdowell is a 37 y.o. female.   The history is provided by the patient.   Patient presents for complaints of vaginal discharge that been present for the past week.  Patient denies vaginal odor, vaginal itching, urinary frequency, urgency, hesitancy, or abdominal pain.  Patient reports 1 female partner in the past 90 days.  She is requesting STI testing.    Patient also requesting refill for glipizide and lisinopril.  Patient is under the care of Dr. Allena Katz.  She reports she has not reached out to him for a refill or an appointment.  Patient denies polyuria, polydipsia, or polyphagia.  She further denies chest pain, shortness of breath, difficulty breathing, or lower extremity edema.  BP is normal at triage today.  Past Medical History:  Diagnosis Date   Diabetes mellitus without complication (HCC)    Hypertension     Patient Active Problem List   Diagnosis Date Noted   Mixed hyperlipidemia 07/30/2022   Uterine leiomyoma 07/29/2022   Obesity (BMI 30-39.9) 07/29/2022   Essential hypertension 02/12/2022   Diabetes mellitus (HCC) 02/09/2022   Tobacco abuse 02/09/2022    History reviewed. No pertinent surgical history.  OB History     Gravida  0   Para  0   Term  0   Preterm  0   AB  0   Living  0      SAB  0   IAB  0   Ectopic  0   Multiple  0   Live Births               Home Medications    Prior to Admission medications   Medication Sig Start Date End Date Taking? Authorizing Provider  glipiZIDE (GLUCOTROL) 5 MG tablet Take 1 tablet (5 mg total) by mouth 2 (two) times daily before a meal. 06/12/23 07/12/23 Yes Atwell Mcdanel-Warren, Sadie Haber, NP  lisinopril (ZESTRIL) 10 MG tablet Take 1 tablet (10 mg total) by mouth daily. 06/12/23  Yes Jenene Kauffmann-Warren,  Sadie Haber, NP  metFORMIN (GLUCOPHAGE) 1000 MG tablet Take 1,000 mg by mouth 2 (two) times daily with a meal.    [provider]  norethindrone (MICRONOR) 0.35 MG tablet Take 1 tablet (0.35 mg total) by mouth daily. Patient not taking: Reported on 04/08/2023 03/04/23   Lazaro Arms, MD    Family History Family History  Problem Relation Age of Onset   Hypertension Maternal Grandmother    Diabetes Maternal Grandmother    Asthma Mother    Diabetes Other    Heart failure Other    Cancer Other     Social History Social History   Tobacco Use   Smoking status: Every Day    Current packs/day: 0.50    Types: Cigarettes   Smokeless tobacco: Never  Vaping Use   Vaping status: Never Used  Substance Use Topics   Alcohol use: Yes    Alcohol/week: 1.0 standard drink of alcohol    Types: 1 Cans of beer per week    Comment: sometimes daily, but not always   Drug use: Yes    Types: Marijuana     Allergies   Amoxicillin   Review of Systems Review of Systems Per HPI  Physical  Exam Triage Vital Signs ED Triage Vitals [06/12/23 1322]  Encounter Vitals Group     BP 132/85     Systolic BP Percentile      Diastolic BP Percentile      Pulse Rate 86     Resp 16     Temp 98.3 F (36.8 C)     Temp Source Oral     SpO2 96 %     Weight      Height      Head Circumference      Peak Flow      Pain Score 0     Pain Loc      Pain Education      Exclude from Growth Chart    No data found.  Updated Vital Signs BP 132/85 (BP Location: Right Arm)   Pulse 86   Temp 98.3 F (36.8 C) (Oral)   Resp 16   LMP 05/28/2023 (Approximate)   SpO2 96%   Visual Acuity Right Eye Distance:   Left Eye Distance:   Bilateral Distance:    Right Eye Near:   Left Eye Near:    Bilateral Near:     Physical Exam Vitals and nursing note reviewed.  Constitutional:      General: She is not in acute distress.    Appearance: Normal appearance.  HENT:     Head: Normocephalic.  Eyes:      Extraocular Movements: Extraocular movements intact.     Pupils: Pupils are equal, round, and reactive to light.  Cardiovascular:     Rate and Rhythm: Normal rate and regular rhythm.     Pulses: Normal pulses.  Pulmonary:     Effort: Pulmonary effort is normal.     Breath sounds: Normal breath sounds.  Abdominal:     General: Bowel sounds are normal.     Palpations: Abdomen is soft.  Genitourinary:    Comments: GU exam deferred, self swab performed  Musculoskeletal:     Cervical back: Normal range of motion.  Skin:    General: Skin is warm and dry.  Neurological:     General: No focal deficit present.     Mental Status: She is alert and oriented to person, place, and time.  Psychiatric:        Mood and Affect: Mood normal.        Behavior: Behavior normal.      UC Treatments / Results  Labs (all labs ordered are listed, but only abnormal results are displayed) Labs Reviewed  RPR  HIV ANTIBODY (ROUTINE TESTING W REFLEX)  CERVICOVAGINAL ANCILLARY ONLY    EKG   Radiology No results found.  Procedures Procedures (including critical care time)  Medications Ordered in UC Medications - No data to display  Initial Impression / Assessment and Plan / UC Course  I have reviewed the triage vital signs and the nursing notes.  Pertinent labs & imaging results that were available during my care of the patient were reviewed by me and considered in my medical decision making (see chart for details).  The patient is well-appearing, she is in no acute distress, vital signs are stable.  Cytology swab, HIV, and RPR test are pending.  Patient was advised results will be available within the next 48 to 72 hours.  Will wait until cytology results are received to provide treatment.  Patient was also provided medication refills for glipizide 5 mg and lisinopril 10 mg for treatment for diabetes and high blood pressure.  Supportive care recommendations were provided and discussed with  the patient to include schedule an appointment with her PCP for continued maintenance and monitoring of her diabetes and hypertension, and encouraged condom use with each sexual encounter.  Patient was advised she will be contacted if the pending test results are abnormal.  Patient is in agreement with this plan of care and verbalizes understanding.  All questions were answered.  Patient stable for discharge.  Final Clinical Impressions(s) / UC Diagnoses   Final diagnoses:  Screening examination for sexually transmitted disease  Vaginal discharge  Medication refill     Discharge Instructions      Your results should be available within the next 48 to 72 hours.  If you have access to MyChart, you will be able to see the results there.  If your results are positive, you will be contacted to discuss treatment. Condom use with each sexual encounter. Refrain from sexual intercourse until your pending test results have been received.   For your medication refill: Please follow-up with your primary care physician to schedule an appointment for continued treatment for your diabetes and high blood pressure.  Follow-up as needed.      ED Prescriptions     Medication Sig Dispense Auth. Provider   glipiZIDE (GLUCOTROL) 5 MG tablet Take 1 tablet (5 mg total) by mouth 2 (two) times daily before a meal. 60 tablet Drayden Lukas-Warren, Sadie Haber, NP   lisinopril (ZESTRIL) 10 MG tablet Take 1 tablet (10 mg total) by mouth daily. 30 tablet Samaia Iwata-Warren, Sadie Haber, NP      PDMP not reviewed this encounter.   Abran Cantor, NP 06/12/23 1351

## 2023-06-12 NOTE — Discharge Instructions (Addendum)
Your results should be available within the next 48 to 72 hours.  If you have access to MyChart, you will be able to see the results there.  If your results are positive, you will be contacted to discuss treatment. Condom use with each sexual encounter. Refrain from sexual intercourse until your pending test results have been received.   For your medication refill: Please follow-up with your primary care physician to schedule an appointment for continued treatment for your diabetes and high blood pressure.  Follow-up as needed.

## 2023-06-12 NOTE — ED Triage Notes (Signed)
White Vaginal discharge x 1 week.  States she needs a refill on her lisinopril and glipizide.

## 2023-06-13 LAB — HIV ANTIBODY (ROUTINE TESTING W REFLEX): HIV Screen 4th Generation wRfx: NONREACTIVE

## 2023-06-13 LAB — RPR: RPR Ser Ql: NONREACTIVE

## 2023-06-14 LAB — CERVICOVAGINAL ANCILLARY ONLY
Bacterial Vaginitis (gardnerella): POSITIVE — AB
Candida Glabrata: NEGATIVE
Candida Vaginitis: NEGATIVE
Chlamydia: NEGATIVE
Comment: NEGATIVE
Comment: NEGATIVE
Comment: NEGATIVE
Comment: NEGATIVE
Comment: NEGATIVE
Comment: NORMAL
Neisseria Gonorrhea: NEGATIVE
Trichomonas: NEGATIVE

## 2023-06-16 ENCOUNTER — Telehealth: Payer: Self-pay | Admitting: Emergency Medicine

## 2023-06-16 MED ORDER — METRONIDAZOLE 500 MG PO TABS
500.0000 mg | ORAL_TABLET | Freq: Two times a day (BID) | ORAL | 0 refills | Status: DC
Start: 2023-06-16 — End: 2023-07-29

## 2023-06-16 NOTE — Telephone Encounter (Signed)
Metronidazole for BV

## 2023-07-28 ENCOUNTER — Encounter (HOSPITAL_COMMUNITY): Payer: Self-pay

## 2023-07-28 ENCOUNTER — Emergency Department (HOSPITAL_COMMUNITY)
Admission: EM | Admit: 2023-07-28 | Discharge: 2023-07-29 | Disposition: A | Discharge status: Home / Self Care | Payer: Medicaid Other | Attending: Emergency Medicine | Admitting: Emergency Medicine

## 2023-07-28 ENCOUNTER — Other Ambulatory Visit: Payer: Self-pay

## 2023-07-28 DIAGNOSIS — Z79899 Other long term (current) drug therapy: Secondary | ICD-10-CM | POA: Insufficient documentation

## 2023-07-28 DIAGNOSIS — E1165 Type 2 diabetes mellitus with hyperglycemia: Secondary | ICD-10-CM | POA: Diagnosis not present

## 2023-07-28 DIAGNOSIS — I10 Essential (primary) hypertension: Secondary | ICD-10-CM | POA: Diagnosis not present

## 2023-07-28 DIAGNOSIS — R42 Dizziness and giddiness: Secondary | ICD-10-CM | POA: Diagnosis present

## 2023-07-28 DIAGNOSIS — Z7984 Long term (current) use of oral hypoglycemic drugs: Secondary | ICD-10-CM | POA: Insufficient documentation

## 2023-07-28 LAB — CBG MONITORING, ED: Glucose-Capillary: 87 mg/dL (ref 70–99)

## 2023-07-28 NOTE — ED Triage Notes (Signed)
Pt arrived via POV from work where she reports she began feeling dizzy at work reports having an episode of emesis following this. Pts CBG in Triage is 87.

## 2023-07-29 LAB — COMPREHENSIVE METABOLIC PANEL
ALT: 36 U/L (ref 0–44)
AST: 16 U/L (ref 15–41)
Albumin: 3.6 g/dL (ref 3.5–5.0)
Alkaline Phosphatase: 97 U/L (ref 38–126)
Anion gap: 8 (ref 5–15)
BUN: 12 mg/dL (ref 6–20)
CO2: 24 mmol/L (ref 22–32)
Calcium: 8.5 mg/dL — ABNORMAL LOW (ref 8.9–10.3)
Chloride: 106 mmol/L (ref 98–111)
Creatinine, Ser: 0.6 mg/dL (ref 0.44–1.00)
GFR, Estimated: >60 mL/min (ref >60)
Glucose, Bld: 177 mg/dL — ABNORMAL HIGH (ref 70–99)
Potassium: 3.5 mmol/L (ref 3.5–5.1)
Sodium: 138 mmol/L (ref 135–145)
Total Bilirubin: 0.4 mg/dL (ref 0.3–1.2)
Total Protein: 6.9 g/dL (ref 6.5–8.1)

## 2023-07-29 LAB — CBC
HCT: 35.4 % — ABNORMAL LOW (ref 36.0–46.0)
Hemoglobin: 11.6 g/dL — ABNORMAL LOW (ref 12.0–15.0)
MCH: 28.9 pg (ref 26.0–34.0)
MCHC: 32.8 g/dL (ref 30.0–36.0)
MCV: 88.1 fL (ref 80.0–100.0)
Platelets: 420 10*3/uL — ABNORMAL HIGH (ref 150–400)
RBC: 4.02 MIL/uL (ref 3.87–5.11)
RDW: 12.7 % (ref 11.5–15.5)
WBC: 9.4 10*3/uL (ref 4.0–10.5)
nRBC: 0 % (ref 0.0–0.2)

## 2023-07-29 LAB — LIPASE, BLOOD: Lipase: 36 U/L (ref 11–51)

## 2023-07-29 MED ORDER — LISINOPRIL 10 MG PO TABS: 10.0000 mg | ORAL_TABLET | Freq: Every day | ORAL | 0 refills | Status: DC

## 2023-07-29 NOTE — ED Notes (Signed)
Dizzy and vomited earlier, checked BP it was high, took BP med, but normally does not take them. Says she hasn't vomited while waiting here the last 3 hours, pt has water bottle in hand. Pt ambulatory at this time, no reports of dizziness.

## 2023-07-29 NOTE — ED Provider Notes (Signed)
[facility] Provider Note   CSN: [id number] Arrival date & time: 07/28/23  2215     History  Chief Complaint  Patient presents with   Hypertension    Mallory Mcdowell is a 37 y.o. female.  The history is provided by the patient.  Patient history of diabetes and hypertension presents for multiple complaints.  Patient mitts she has not been taking her blood pressure every day and noted it has been elevated.  When she went to work she had mild headache and had dizziness and did have some vomiting.  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 No chest pain or shortness of breath.  No focal weakness.  She is now feeling improved & has been drinking water and eating chicken.     Past Medical History:  Diagnosis Date   Diabetes mellitus without complication (HCC)    Hypertension     Home Medications Prior to Admission medications   Medication Sig Start Date End Date Taking? Authorizing Provider  lisinopril (ZESTRIL) 10 MG tablet Take 1 tablet (10 mg total) by mouth daily. 07/29/23  Yes Zadie Rhine, MD  glipiZIDE (GLUCOTROL) 5 MG tablet Take 1 tablet (5 mg total) by mouth 2 (two) times daily before a meal. 06/12/23 07/12/23  Leath-Warren, Sadie Haber, NP  metFORMIN (GLUCOPHAGE) 1000 MG tablet Take 1,000 mg by mouth 2 (two) times daily with a meal.    [provider]  norethindrone (MICRONOR) 0.35 MG tablet Take 1 tablet (0.35 mg total) by mouth daily. Patient not taking: Reported on 04/08/2023 03/04/23   Lazaro Arms, MD      Allergies    Amoxicillin    Review of Systems   Review of Systems  Eyes:  Negative for visual disturbance.  Gastrointestinal:  Positive for vomiting.  Neurological:  Positive for dizziness and headaches.    Physical Exam Updated Vital Signs BP (!) 165/107   Pulse 80   Temp 98.2 F (36.8 C)   Resp 19   Ht 1.651 m (5\' 5" )   Wt 79 kg   LMP 07/25/2023 (Approximate)   SpO2 100%   BMI  28.98 kg/m  Physical Exam CONSTITUTIONAL: Well developed/well nourished HEAD: Normocephalic/atraumatic EYES: EOMI/PERRL, no nystagmus, no ptosis ENMT: Mucous membranes moist NECK: supple no meningeal signs, no bruits CV: S1/S2 noted, no murmurs/rubs/gallops noted LUNGS: Lungs are clear to auscultation bilaterally, no apparent distress NEURO:Awake/alert, face symmetric, no arm or leg drift is noted Equal 5/5 strength with shoulder abduction, elbow flex/extension, wrist flex/extension in upper extremities and equal hand grips bilaterally Equal 5/5 strength with hip flexion,knee flex/extension, foot dorsi/plantar flexion Cranial nerves 3/4/5/6/05/17/09/11/12 tested and intact Gait normal without ataxia No past pointing Sensation to light touch intact in all extremities EXTREMITIES: pulses normal, full ROM SKIN: warm, color normal PSYCH: no abnormalities of mood noted, alert and oriented to situation  ED Results / Procedures / Treatments   Labs (all labs ordered are listed, but only abnormal results are displayed) Labs Reviewed  COMPREHENSIVE METABOLIC PANEL - Abnormal; Notable for the following components:      Result Value   Glucose, Bld 177 (*)    Calcium 8.5 (*)    All other components within normal limits  CBC - Abnormal; Notable for the following components:   Hemoglobin 11.6 (*)    HCT 35.4 (*)    Platelets 420 (*)    All other components within normal limits  LIPASE, BLOOD  URINALYSIS,  ROUTINE W REFLEX MICROSCOPIC  CBG MONITORING, ED  POC URINE PREG, ED    EKG EKG Interpretation Date/Time:  Thursday July 29 2023 02:00:39 EDT Ventricular Rate:  78 PR Interval:  135 QRS Duration:  77 QT Interval:  391 QTC Calculation: 446 R Axis:   68  Text Interpretation: Sinus rhythm Probable left atrial enlargement Confirmed by Zadie Rhine ([id number]) on 07/29/2023 2:11:52 AM  Radiology No results found.  Procedures Procedures    Medications Ordered in  ED Medications - No data to display  ED Course/ Medical Decision Making/ A&P                                 Medical Decision Making Amount and/or Complexity of Data Reviewed Labs: ordered. ECG/medicine tests: ordered.  Risk Prescription drug management.   This patient presents to the ED for concern of headache and elevated blood pressure, this involves an extensive number of treatment options, and is a complaint that carries with it a high risk of complications and morbidity.  The differential diagnosis includes but is not limited to subarachnoid hemorrhage, intracranial hemorrhage, meningitis, encephalitis, CVST, temporal arteritis, idiopathic intracranial hypertension, migraine    Comorbidities that complicate the patient evaluation: Patient's presentation is complicated by their history of hypertension and diabetes  Social Determinants of Health: Patient's  prescription nonadherence   increases the complexity of managing their presentation  Lab Tests: I Ordered, and personally interpreted labs.  The pertinent results include: Mild hyperglycemia  Test Considered: I considered neuroimaging but since patient is improving, will defer at this time  Reevaluation: After the interventions noted above, I reevaluated the patient and found that they have :improved  Complexity of problems addressed: Patient's presentation is most consistent with  acute presentation with potential threat to life or bodily function  Disposition: After consideration of the diagnostic results and the patient's response to treatment,  I feel that the patent would benefit from discharge   .           Final Clinical Impression(s) / ED Diagnoses Final diagnoses:  Primary hypertension    Rx / DC Orders ED Discharge Orders          Ordered    lisinopril (ZESTRIL) 10 MG tablet  Daily        07/29/23 0232              Zadie Rhine, MD 07/29/23 [phone]

## 2023-08-03 ENCOUNTER — Ambulatory Visit: Payer: Medicaid Other

## 2023-08-05 ENCOUNTER — Ambulatory Visit: Payer: Medicaid Other

## 2023-08-06 ENCOUNTER — Ambulatory Visit
Admission: RE | Admit: 2023-08-06 | Discharge: 2023-08-06 | Disposition: A | Discharge status: Home / Self Care | Payer: Medicaid Other | Source: Ambulatory Visit | Attending: Family Medicine | Admitting: Family Medicine

## 2023-08-06 VITALS — BP 150/85 | HR 75 | Temp 98.1°F | Resp 20

## 2023-08-06 DIAGNOSIS — N760 Acute vaginitis: Secondary | ICD-10-CM | POA: Insufficient documentation

## 2023-08-06 LAB — POCT URINALYSIS DIP (MANUAL ENTRY)
Bilirubin, UA: NEGATIVE
Blood, UA: NEGATIVE
Glucose, UA: NEGATIVE mg/dL
Ketones, POC UA: NEGATIVE mg/dL
Nitrite, UA: NEGATIVE
Protein Ur, POC: NEGATIVE mg/dL
Spec Grav, UA: 1.02 (ref 1.010–1.025)
Urobilinogen, UA: 1 U/dL
pH, UA: 6 (ref 5.0–8.0)

## 2023-08-06 NOTE — ED Triage Notes (Signed)
Pt reports she has been having vaginal discharge x 5 days.

## 2023-08-06 NOTE — ED Provider Notes (Signed)
[facility]    CSN: [id number] Arrival date & time: 08/06/23  1222      History   Chief Complaint Chief Complaint  Patient presents with   Vaginal Discharge    Entered by patient    HPI ED RAYSON is a 37 y.o. female.   Patient presenting today with 5-day history of vaginal discharge.  Denies itching, pain, odor, pelvic or abdominal pain, fevers, urinary symptoms.  So far not tried anything over-the-counter for symptoms.  Does endorse using scented body wash.    Past Medical History:  Diagnosis Date   Diabetes mellitus without complication (HCC)    Hypertension     Patient Active Problem List   Diagnosis Date Noted   Mixed hyperlipidemia 07/30/2022   Uterine leiomyoma 07/29/2022   Obesity (BMI 30-39.9) 07/29/2022   Essential hypertension 02/12/2022   Diabetes mellitus (HCC) 02/09/2022   Tobacco abuse 02/09/2022    History reviewed. No pertinent surgical history.  OB History     Gravida  0   Para  0   Term  0   Preterm  0   AB  0   Living  0      SAB  0   IAB  0   Ectopic  0   Multiple  0   Live Births               Home Medications    Prior to Admission medications   Medication Sig Start Date End Date Taking? Authorizing Provider  glipiZIDE (GLUCOTROL) 5 MG tablet Take 1 tablet (5 mg total) by mouth 2 (two) times daily before a meal. 06/12/23 07/12/23  Leath-Warren, Sadie Haber, NP  lisinopril (ZESTRIL) 10 MG tablet Take 1 tablet (10 mg total) by mouth daily. 07/29/23   Zadie Rhine, MD  metFORMIN (GLUCOPHAGE) 1000 MG tablet Take 1,000 mg by mouth 2 (two) times daily with a meal.    [provider]  norethindrone (MICRONOR) 0.35 MG tablet Take 1 tablet (0.35 mg total) by mouth daily. Patient not taking: Reported on 04/08/2023 03/04/23   Lazaro Arms, MD    Family History Family History  Problem Relation Age of Onset   Hypertension Maternal Grandmother    Diabetes Maternal Grandmother    Asthma Mother     Diabetes Other    Heart failure Other    Cancer Other     Social History Social History   Tobacco Use   Smoking status: Every Day    Current packs/day: 0.50    Types: Cigarettes   Smokeless tobacco: Never  Vaping Use   Vaping status: Never Used  Substance Use Topics   Alcohol use: Yes    Alcohol/week: 1.0 standard drink of alcohol    Types: 1 Cans of beer per week    Comment: sometimes daily, but not always   Drug use: Yes    Types: Marijuana     Allergies   Amoxicillin   Review of Systems Review of Systems Per HPI  Physical Exam Triage Vital Signs ED Triage Vitals  Encounter Vitals Group     BP 08/06/23 1231 (!) 150/85     Systolic BP Percentile --      Diastolic BP Percentile --      Pulse Rate 08/06/23 1231 75     Resp 08/06/23 1231 20     Temp 08/06/23 1231 98.1 F (36.7 C)     Temp Source 08/06/23 1231 Oral     SpO2 08/06/23  1231 98 %     Weight --      Height --      Head Circumference --      Peak Flow --      Pain Score 08/06/23 1233 0     Pain Loc --      Pain Education --      Exclude from Growth Chart --    No data found.  Updated Vital Signs BP (!) 150/85 (BP Location: Right Arm)   Pulse 75   Temp 98.1 F (36.7 C) (Oral)   Resp 20   LMP 07/25/2023 (Approximate)   SpO2 98%   Visual Acuity Right Eye Distance:   Left Eye Distance:   Bilateral Distance:    Right Eye Near:   Left Eye Near:    Bilateral Near:     Physical Exam Vitals and nursing note reviewed.  Constitutional:      Appearance: Normal appearance. She is not ill-appearing.  HENT:     Head: Atraumatic.  Eyes:     Extraocular Movements: Extraocular movements intact.     Conjunctiva/sclera: Conjunctivae normal.  Cardiovascular:     Rate and Rhythm: Normal rate and regular rhythm.     Heart sounds: Normal heart sounds.  Pulmonary:     Effort: Pulmonary effort is normal.     Breath sounds: Normal breath sounds.  Abdominal:     General: Bowel sounds are  normal. There is no distension.     Palpations: Abdomen is soft.     Tenderness: There is no abdominal tenderness. There is no guarding.  Genitourinary:    Comments: GU exam deferred, self swab performed Musculoskeletal:        General: Normal range of motion.     Cervical back: Normal range of motion and neck supple.  Skin:    General: Skin is warm and dry.  Neurological:     Mental Status: She is alert and oriented to person, place, and time.  Psychiatric:        Mood and Affect: Mood normal.        Thought Content: Thought content normal.        Judgment: Judgment normal.      UC Treatments / Results  Labs (all labs ordered are listed, but only abnormal results are displayed) Labs Reviewed  POCT URINALYSIS DIP (MANUAL ENTRY) - Abnormal; Notable for the following components:      Result Value   Leukocytes, UA Trace (*)    All other components within normal limits  CERVICOVAGINAL ANCILLARY ONLY    EKG   Radiology No results found.  Procedures Procedures (including critical care time)  Medications Ordered in UC Medications - No data to display  Initial Impression / Assessment and Plan / UC Course  I have reviewed the triage vital signs and the nursing notes.  Pertinent labs & imaging results that were available during my care of the patient were reviewed by me and considered in my medical decision making (see chart for details).     Vital and exam reassuring, vaginal swab pending, urinalysis without significant abnormalities.  Will treat based on vaginal swab results and in the meantime discussed probiotics, boric acid and other supportive measures.  Unscented soaps only.  Final Clinical Impressions(s) / UC Diagnoses   Final diagnoses:  Acute vaginitis     Discharge Instructions      You may use female genital tract health probiotics daily, boric acid vaginal suppositories daily as needed, and  only use unscented soap such as Dove unscented.  We will call  if anything is abnormal with your test results and provide treatment based on these.    ED Prescriptions   None    PDMP not reviewed this encounter.   Particia Nearing, [place] 08/06/23 1257

## 2023-08-06 NOTE — Discharge Instructions (Signed)
You may use female genital tract health probiotics daily, boric acid vaginal suppositories daily as needed, and only use unscented soap such as Dove unscented.  We will call if anything is abnormal with your test results and provide treatment based on these.

## 2023-08-09 ENCOUNTER — Telehealth: Payer: Self-pay | Admitting: Emergency Medicine

## 2023-08-09 LAB — CERVICOVAGINAL ANCILLARY ONLY
Bacterial Vaginitis (gardnerella): POSITIVE — AB
Candida Glabrata: NEGATIVE
Candida Vaginitis: POSITIVE — AB
Chlamydia: NEGATIVE
Comment: NEGATIVE
Comment: NEGATIVE
Comment: NEGATIVE
Comment: NEGATIVE
Comment: NEGATIVE
Comment: NORMAL
Neisseria Gonorrhea: NEGATIVE
Trichomonas: NEGATIVE

## 2023-08-09 MED ORDER — FLUCONAZOLE 150 MG PO TABS: 150.0000 mg | ORAL_TABLET | Freq: Once | ORAL | 0 refills | Status: AC

## 2023-08-09 MED ORDER — METRONIDAZOLE 500 MG PO TABS: 500.0000 mg | ORAL_TABLET | Freq: Two times a day (BID) | ORAL | 0 refills | Status: DC

## 2023-08-09 NOTE — Telephone Encounter (Signed)
Metronidazole for BV Diflucan for yeast

## 2023-08-16 ENCOUNTER — Other Ambulatory Visit: Payer: Medicaid Other

## 2023-08-19 ENCOUNTER — Other Ambulatory Visit: Payer: Medicaid Other

## 2023-08-20 ENCOUNTER — Other Ambulatory Visit: Payer: Medicaid Other

## 2023-08-20 ENCOUNTER — Other Ambulatory Visit: Payer: Self-pay

## 2023-08-20 DIAGNOSIS — E1169 Type 2 diabetes mellitus with other specified complication: Secondary | ICD-10-CM

## 2023-08-21 LAB — HEMOGLOBIN A1C
Est. average glucose Bld gHb Est-mCnc: 192 mg/dL
Hgb A1c MFr Bld: 8.3 % — ABNORMAL HIGH (ref 4.8–5.6)

## 2023-08-23 DIAGNOSIS — Z20822 Contact with and (suspected) exposure to covid-19: Secondary | ICD-10-CM | POA: Diagnosis not present

## 2023-08-23 DIAGNOSIS — J00 Acute nasopharyngitis [common cold]: Secondary | ICD-10-CM | POA: Diagnosis not present

## 2023-08-25 ENCOUNTER — Telehealth: Payer: Self-pay | Admitting: Obstetrics and Gynecology

## 2023-08-25 NOTE — Telephone Encounter (Signed)
Called patient and confirmed ID x2.  Reviewed most recent A1c and that it remains above preferred preoperative level of 8.  Patient states that she has been doing better, and that her sugars have been improving.  She does not typically check her fingersticks at home, but notes that whenever she has been to the emergency department her sugars usually are somewhere between 80 and 100.  She does feel that she occasionally has low sugars, and would either eat a peppermint or have a little bit of Coca-Cola.  She states it feels different than when her blood pressure is high, which is typically accompanied by a headache.  Reviewed that MRI demonstrates a large fibroid as well as other slightly smaller fibroids, and that I believe the most efficient route of removal would be with an open/abdominal approach, and that I reviewed this with primary GYN.  Will reach out to original GYN to coordinate scheduling for open myomectomy.  Will also confer with them regarding glycemic control.  Patient notes that she recently ran out of medication, recommend putting in request for refill.  Also recommend checking sugars if she feels as though it is low, before drinking or eating something sweet.  Lorriane Shire, MD, FACOG Minimally Invasive Gynecologic Surgery  Obstetrics and Gynecology, [facility] for [facility], [facility] 08/25/2023

## 2023-08-31 ENCOUNTER — Telehealth: Payer: Self-pay | Admitting: *Deleted

## 2023-08-31 NOTE — Telephone Encounter (Signed)
Transition Care Management Follow-up Telephone Call Date of discharge and from where: [facility]  08/28/2023 How have you been since you were released from the hospital? Doing ok Any questions or concerns? No  Items Reviewed: Did the pt receive and understand the discharge instructions provided? Yes  Medications obtained and verified? Yes  Other? No   Dietary orders reviewed? No Do you have support at home? Yes    Dione Booze [facility]  Population Health Careguide  Direct Dial: [phone] Website: [email or web address]

## 2023-09-01 ENCOUNTER — Telehealth: Payer: Self-pay

## 2023-09-01 ENCOUNTER — Encounter: Payer: Self-pay | Admitting: Obstetrics & Gynecology

## 2023-09-01 ENCOUNTER — Encounter: Payer: Self-pay | Admitting: *Deleted

## 2023-09-01 ENCOUNTER — Other Ambulatory Visit: Payer: Self-pay | Admitting: *Deleted

## 2023-09-01 MED ORDER — GLIPIZIDE 5 MG PO TABS: 5.0000 mg | ORAL_TABLET | Freq: Two times a day (BID) | ORAL | 0 refills | Status: DC

## 2023-09-01 NOTE — Telephone Encounter (Signed)
Patient called and stated that she needs Dr. Despina Hidden to send in her a rx for glipiZIDE (GLUCOTROL) 5 MG tablet .

## 2023-09-01 NOTE — Telephone Encounter (Signed)
Refill request sent

## 2023-09-02 MED ORDER — MEGESTROL ACETATE 40 MG PO TABS: ORAL_TABLET | ORAL | 0 refills | Status: DC

## 2023-09-22 ENCOUNTER — Encounter: Payer: Self-pay | Admitting: Obstetrics & Gynecology

## 2023-09-24 ENCOUNTER — Other Ambulatory Visit: Payer: Self-pay | Admitting: Obstetrics & Gynecology

## 2023-09-24 DIAGNOSIS — Z01818 Encounter for other preprocedural examination: Secondary | ICD-10-CM

## 2023-09-24 NOTE — Patient Instructions (Signed)
Mallory Mcdowell  09/24/2023     @PREFPERIOPPHARMACY @   Your procedure is scheduled on 09/27/2024.  Report to [facility] at 10:00 A.M.  Call this number if you have problems the morning of surgery:  [phone]  If you experience any cold or flu symptoms such as cough, fever, chills, shortness of breath, etc. between now and your scheduled surgery, please notify us at the above number.   Remember:   Do not eat anything after midnight.   Please drink your Gatorade Zero @8 :00 the morning of the procedure.      You may drink clear liquids until 8:00am .  Clear liquids allowed are:                    Water, Carbonated beverages (diabetics please choose diet or no sugar options), Clear Tea (No creamer, milk, or cream, including half & half and powdered creamer), Black Coffee Only (No creamer, milk or cream, including half & half and powdered creamer), and Clear Sports drink (No red color; diabetics please choose diet or no sugar options)    Take these medicines the morning of surgery with A SIP OF WATER :none   Do not take any diabetic medications the am of the procedure. n    Do not wear jewelry, make-up or nail polish, including gel polish,  artificial nails, or any other type of covering on natural nails (fingers and  toes).  Do not wear lotions, powders, or perfumes, or deodorant.  Do not shave 48 hours prior to surgery.  Men may shave face and neck.  Do not bring valuables to the hospital.  [facility] is not responsible for any belongings or valuables.  Contacts, dentures or bridgework may not be worn into surgery.  Leave your suitcase in the car.  After surgery it may be brought to your room.  For patients admitted to the hospital, discharge time will be determined by your treatment team.  Patients discharged the day of surgery will not be allowed to drive home.   Name and phone number of your driver:   Family Special instructions:  N/A  Please read over the  following fact sheets that you were given. Care and Recovery After Surgery  Myomectomy  Myomectomy is a surgery to remove a noncancerous fibroid (myoma) from the uterus. Myomas are tumors made up of fibrous tissue. They are often called fibroid tumors. They can range from the size of a pea to the size of a grapefruit. In a myomectomy, the fibroid tumor is removed without removing the uterus. This surgery is usually done if the tumor is growing or causing symptoms, such as pain, pressure, bleeding, pain during intercourse, or problems getting pregnant. Tell a health care provider about: Any allergies you have. All medicines you are taking, including vitamins, herbs, eye drops, creams, and over-the-counter medicines. Any problems you or family members have had with anesthetic medicines. Any blood disorders you have. Any surgeries you have had. Any medical conditions you have. Whether you are pregnant or may be pregnant. What are the risks? Generally, this is a safe procedure. However, problems may occur, including: Bleeding. Infection. Allergic reactions to medicines. Damage to nearby structures or organs. Blood clots in the legs, chest, or brain. Scar tissue on nearby organs and in the pelvis. This may require another surgery to remove the scar tissue. What happens before the procedure? Staying hydrated Follow instructions from your health care provider about hydration, which may include: Up  to 2 hours before the procedure - you may continue to drink clear liquids, such as water, clear fruit juice, black coffee, and plain tea.  Eating and drinking restrictions Follow instructions from your health care provider about eating and drinking, which may include: 8 hours before the procedure - stop eating heavy meals or foods, such as meat, fried foods, or fatty foods. 6 hours before the procedure - stop eating light meals or foods, such as toast or cereal. 6 hours before the procedure - stop  drinking milk or drinks that contain milk. 2 hours before the procedure - stop drinking clear liquids. Medicines Ask your health care provider about: Changing or stopping your regular medicines. This is especially important if you are taking diabetes medicines or blood thinners. Taking medicines such as aspirin and ibuprofen. These medicines can thin your blood. Do not take these medicines unless your health care provider tells you to take them. Taking over-the-counter medicines, vitamins, herbs, and supplements. General instructions Do not use any products that contain nicotine or tobacco for at least 4 weeks before the procedure. These products include cigarettes, chewing tobacco, and vaping devices, such as e-cigarettes. If you need help quitting, ask your health care provider. Plan to have a responsible adult take you home from the hospital or clinic. Plan to have a responsible adult care for you for the time you are told after you leave the hospital or clinic. This is important. Ask your health care provider what steps will be taken to help prevent infection. These steps may include: Removing hair at the surgery site. Washing skin with a germ-killing soap. Taking antibiotic medicine. What happens during the procedure? An IV will be inserted into one of your veins. You will be given one or more of the following: A medicine to help you relax (sedative). A medicine to make you fall asleep (general anesthetic). A small, thin tube (catheter) will be inserted into your bladder to drain urine. One of the following methods will be used to remove the fibroid. The method used will depend on the size, shape, location, and number of fibroids. Hysteroscopic myomectomy. This may be done when the fibroid tumor is inside the cavity of the uterus. A long, thin tube with a lens (hysteroscope) will be inserted into the uterus through the vagina. A salt and water solution called saline will be put into the  uterus. This will expand the uterus and allow the surgeon to see the fibroids. Tools will be passed through the hysteroscope to remove the fibroid tumor in pieces. Laparoscopic myomectomy. A few small incisions will be made in the lower abdomen. A thin, lighted tube with a camera (laparoscope) will be inserted through one of the incisions. This will give the surgeon a good view of the area. The fibroid tumor will be removed through the other incisions. The incisions will then be closed with stitches (sutures) or staples. Abdominal myomectomy. This is done when the fibroid tumor cannot be removed with a hysteroscope or laparoscope. The fibroid tumor will be removed through a larger incision that is made in the abdomen. The incision will be closed with sutures or staples. Recovery time will be longer with this method. These procedures may vary among health care providers and hospitals. What can I expect after the procedure? Your blood pressure, heart rate, breathing rate, and blood oxygen level will be monitored until you leave the hospital or clinic. The IV and catheter will remain inserted for a period of time. You may  be given medicine for pain as needed. You may be given an antibiotic medicine if needed. If you were given a sedative during the procedure, it can affect you for several hours. Do not drive or operate machinery until your health care provider says that it is safe. Summary Myomectomy is a surgery to remove a noncancerous fibroid (myoma) from the uterus. This surgery is usually done if the tumor is growing or causing symptoms, such as pain, pressure, bleeding, pain during intercourse, or problems getting pregnant. Follow instructions from your health care provider about eating and drinking before the procedure. Recovery time depends on the method used in this procedure. The abdominal method will require a longer recovery. This information is not intended to replace advice given to you by  your health care provider. Make sure you discuss any questions you have with your health care provider. Document Revised: 05/30/2020 Document Reviewed: 05/30/2020 Elsevier Patient Education  2024 [employer]  General Anesthesia, Adult General anesthesia is the use of medicine to make you fall asleep (unconscious) for a medical procedure. General anesthesia must be used for certain procedures. It is often recommended for surgery or procedures that: Last a long time. Require you to be still or in an unusual position. Are major and can cause blood loss. Affect your breathing. The medicines used for general anesthesia are called general anesthetics. During general anesthesia, these medicines are given along with medicines that: Prevent pain. Control your blood pressure. Relax your muscles. Prevent nausea and vomiting after the procedure. Tell a health care provider about: Any allergies you have. All medicines you are taking, including vitamins, herbs, eye drops, creams, and over-the-counter medicines. Your history of any: Medical conditions you have, including: High blood pressure. Bleeding problems. Diabetes. Heart or lung conditions, such as: Heart failure. Sleep apnea. Asthma. Chronic obstructive pulmonary disease (COPD). Current or recent illnesses, such as: Upper respiratory, chest, or ear infections. Cough or fever. Tobacco or drug use, including marijuana or alcohol use. Depression or anxiety. Surgeries and types of anesthetics you have had. Problems you or family members have had with anesthetic medicines. Whether you are pregnant or may be pregnant. Whether you have any chipped or loose teeth, dentures, caps, bridgework, or issues with your mouth, swallowing, or choking. What are the risks? Your health care provider will talk with you about risks. These may include: Allergic reaction to the medicines. Lung and heart problems. Inhaling food or liquid from the stomach  into the lungs (aspiration). Nerve injury. Injury to the lips, mouth, teeth, or gums. Stroke. Waking up during your procedure and being unable to move. This is rare. These problems are more likely to develop if you are having a major surgery or if you have an advanced or serious medical condition. You can prevent some of these complications by answering all of your health care provider's questions thoroughly and by following all instructions before your procedure. General anesthesia can cause side effects, including: Nausea or vomiting. A sore throat or hoarseness from the breathing tube. Wheezing or coughing. Shaking chills or feeling cold. Body aches. Sleepiness. Confusion, agitation (delirium), or anxiety. What happens before the procedure? When to stop eating and drinking Follow instructions from your health care provider about what you may eat and drink before your procedure. If you do not follow your health care provider's instructions, your procedure may be delayed or canceled. Medicines Ask your health care provider about: Changing or stopping your regular medicines. These include any diabetes medicines or blood thinners  you take. Taking medicines such as aspirin and ibuprofen. These medicines can thin your blood. Do not take them unless your health care provider tells you to. Taking over-the-counter medicines, vitamins, herbs, and supplements. General instructions Do not use any products that contain nicotine or tobacco for at least 4 weeks before the procedure. These products include cigarettes, chewing tobacco, and vaping devices, such as e-cigarettes. If you need help quitting, ask your health care provider. If you brush your teeth on the morning of the procedure, make sure to spit out all of the water and toothpaste. If told by your health care provider, bring your sleep apnea device with you to surgery (if applicable). If you will be going home right after the procedure, plan  to have a responsible adult: Take you home from the hospital or clinic. You will not be allowed to drive. Care for you for the time you are told. What happens during the procedure?  An IV will be inserted into one of your veins. You will be given one or more of the following through a face mask or IV: A sedative. This helps you relax. Anesthesia. This will: Numb certain areas of your body. Make you fall asleep for surgery. After you are unconscious, a breathing tube may be inserted down your throat to help you breathe. This will be removed before you wake up. An anesthesia provider, such as an anesthesiologist, will stay with you throughout your procedure. The anesthesia provider will: Keep you comfortable and safe by continuing to give you medicines and adjusting the amount of medicine that you get. Monitor your blood pressure, heart rate, and oxygen levels to make sure that the anesthetics do not cause any problems. The procedure may vary among health care providers and hospitals. What happens after the procedure? Your blood pressure, temperature, heart rate, breathing rate, and blood oxygen level will be monitored until you leave the hospital or clinic. You will wake up in a recovery area. You may wake up slowly. You may be given medicine to help you with pain, nausea, or any other side effects from the anesthesia. Summary General anesthesia is the use of medicine to make you fall asleep (unconscious) for a medical procedure. Follow your health care provider's instructions about when to stop eating, drinking, or taking certain medicines before your procedure. Plan to have a responsible adult take you home from the hospital or clinic. This information is not intended to replace advice given to you by your health care provider. Make sure you discuss any questions you have with your health care provider. Document Revised: 01/22/2022 Document Reviewed: 01/22/2022 Elsevier Patient Education   2024 [employer]  How to Use Chlorhexidine Before Surgery Chlorhexidine gluconate (CHG) is a germ-killing (antiseptic) solution that is used to clean the skin. It can get rid of the bacteria that normally live on the skin and can keep them away for about 24 hours. To clean your skin with CHG, you may be given: A CHG solution to use in the shower or as part of a sponge bath. A prepackaged cloth that contains CHG. Cleaning your skin with CHG may help lower the risk for infection: While you are staying in the intensive care unit of the hospital. If you have a vascular access, such as a central line, to provide short-term or long-term access to your veins. If you have a catheter to drain urine from your bladder. If you are on a ventilator. A ventilator is a machine that helps you  breathe by moving air in and out of your lungs. After surgery. What are the risks? Risks of using CHG include: A skin reaction. Hearing loss, if CHG gets in your ears and you have a perforated eardrum. Eye injury, if CHG gets in your eyes and is not rinsed out. The CHG product catching fire. Make sure that you avoid smoking and flames after applying CHG to your skin. Do not use CHG: If you have a chlorhexidine allergy or have previously reacted to chlorhexidine. On babies younger than 33 months of age. How to use CHG solution Use CHG only as told by your health care provider, and follow the instructions on the label. Use the full amount of CHG as directed. Usually, this is one bottle. During a shower Follow these steps when using CHG solution during a shower (unless your health care provider gives you different instructions): Start the shower. Use your normal soap and shampoo to wash your face and hair. Turn off the shower or move out of the shower stream. Pour the CHG onto a clean washcloth. Do not use any type of brush or rough-edged sponge. Starting at your neck, lather your body down to your toes. Make sure  you follow these instructions: If you will be having surgery, pay special attention to the part of your body where you will be having surgery. Scrub this area for at least 1 minute. Do not use CHG on your head or face. If the solution gets into your ears or eyes, rinse them well with water. Avoid your genital area. Avoid any areas of skin that have broken skin, cuts, or scrapes. Scrub your back and under your arms. Make sure to wash skin folds. Let the lather sit on your skin for 1-2 minutes or as long as told by your health care provider. Thoroughly rinse your entire body in the shower. Make sure that all body creases and crevices are rinsed well. Dry off with a clean towel. Do not put any substances on your body afterward--such as powder, lotion, or perfume--unless you are told to do so by your health care provider. Only use lotions that are recommended by the manufacturer. Put on clean clothes or pajamas. If it is the night before your surgery, sleep in clean sheets.  During a sponge bath Follow these steps when using CHG solution during a sponge bath (unless your health care provider gives you different instructions): Use your normal soap and shampoo to wash your face and hair. Pour the CHG onto a clean washcloth. Starting at your neck, lather your body down to your toes. Make sure you follow these instructions: If you will be having surgery, pay special attention to the part of your body where you will be having surgery. Scrub this area for at least 1 minute. Do not use CHG on your head or face. If the solution gets into your ears or eyes, rinse them well with water. Avoid your genital area. Avoid any areas of skin that have broken skin, cuts, or scrapes. Scrub your back and under your arms. Make sure to wash skin folds. Let the lather sit on your skin for 1-2 minutes or as long as told by your health care provider. Using a different clean, wet washcloth, thoroughly rinse your entire  body. Make sure that all body creases and crevices are rinsed well. Dry off with a clean towel. Do not put any substances on your body afterward--such as powder, lotion, or perfume--unless you are told to do  so by your health care provider. Only use lotions that are recommended by the manufacturer. Put on clean clothes or pajamas. If it is the night before your surgery, sleep in clean sheets. How to use CHG prepackaged cloths Only use CHG cloths as told by your health care provider, and follow the instructions on the label. Use the CHG cloth on clean, dry skin. Do not use the CHG cloth on your head or face unless your health care provider tells you to. When washing with the CHG cloth: Avoid your genital area. Avoid any areas of skin that have broken skin, cuts, or scrapes. Before surgery Follow these steps when using a CHG cloth to clean before surgery (unless your health care provider gives you different instructions): Using the CHG cloth, vigorously scrub the part of your body where you will be having surgery. Scrub using a back-and-forth motion for 3 minutes. The area on your body should be completely wet with CHG when you are done scrubbing. Do not rinse. Discard the cloth and let the area air-dry. Do not put any substances on the area afterward, such as powder, lotion, or perfume. Put on clean clothes or pajamas. If it is the night before your surgery, sleep in clean sheets.  For general bathing Follow these steps when using CHG cloths for general bathing (unless your health care provider gives you different instructions). Use a separate CHG cloth for each area of your body. Make sure you wash between any folds of skin and between your fingers and toes. Wash your body in the following order, switching to a new cloth after each step: The front of your neck, shoulders, and chest. Both of your arms, under your arms, and your hands. Your stomach and groin area, avoiding the genitals. Your  right leg and foot. Your left leg and foot. The back of your neck, your back, and your buttocks. Do not rinse. Discard the cloth and let the area air-dry. Do not put any substances on your body afterward--such as powder, lotion, or perfume--unless you are told to do so by your health care provider. Only use lotions that are recommended by the manufacturer. Put on clean clothes or pajamas. Contact a health care provider if: Your skin gets irritated after scrubbing. You have questions about using your solution or cloth. You swallow any chlorhexidine. Call your local poison control center ([phone] in the [place] Get help right away if: Your eyes itch badly, or they become very red or swollen. Your skin itches badly and is red or swollen. Your hearing changes. You have trouble seeing. You have swelling or tingling in your mouth or throat. You have trouble breathing. These symptoms may represent a serious problem that is an emergency. Do not wait to see if the symptoms will go away. Get medical help right away. Call your local emergency services (911 in the [place] Do not drive yourself to the hospital. Summary Chlorhexidine gluconate (CHG) is a germ-killing (antiseptic) solution that is used to clean the skin. Cleaning your skin with CHG may help to lower your risk for infection. You may be given CHG to use for bathing. It may be in a bottle or in a prepackaged cloth to use on your skin. Carefully follow your health care provider's instructions and the instructions on the product label. Do not use CHG if you have a chlorhexidine allergy. Contact your health care provider if your skin gets irritated after scrubbing. This information is not intended to replace advice given to  you by your health care provider. Make sure you discuss any questions you have with your health care provider. Document Revised: 02/23/2022 Document Reviewed: 01/06/2021 Elsevier Patient Education  2023 [employer].

## 2023-09-26 ENCOUNTER — Other Ambulatory Visit: Payer: Self-pay | Admitting: Obstetrics & Gynecology

## 2023-09-27 ENCOUNTER — Encounter (HOSPITAL_COMMUNITY): Payer: Self-pay

## 2023-09-27 ENCOUNTER — Encounter (HOSPITAL_COMMUNITY)
Admission: RE | Admit: 2023-09-27 | Discharge: 2023-09-27 | Disposition: A | Discharge status: Home / Self Care | Payer: Medicaid Other | Source: Ambulatory Visit | Attending: Obstetrics & Gynecology

## 2023-09-27 DIAGNOSIS — Z01810 Encounter for preprocedural cardiovascular examination: Secondary | ICD-10-CM | POA: Diagnosis present

## 2023-09-27 DIAGNOSIS — Z01812 Encounter for preprocedural laboratory examination: Secondary | ICD-10-CM | POA: Diagnosis present

## 2023-09-27 DIAGNOSIS — Z01818 Encounter for other preprocedural examination: Secondary | ICD-10-CM | POA: Insufficient documentation

## 2023-09-27 LAB — PREGNANCY, URINE: Preg Test, Ur: NEGATIVE

## 2023-09-27 LAB — URINALYSIS, ROUTINE W REFLEX MICROSCOPIC
Bacteria, UA: NONE SEEN
Bilirubin Urine: NEGATIVE
Glucose, UA: NEGATIVE mg/dL
Ketones, ur: NEGATIVE mg/dL
Leukocytes,Ua: NEGATIVE
Nitrite: NEGATIVE
Protein, ur: NEGATIVE mg/dL
Specific Gravity, Urine: 1.021 (ref 1.005–1.030)
pH: 5 (ref 5.0–8.0)

## 2023-09-27 LAB — TYPE AND SCREEN
ABO/RH(D): O POS
Antibody Screen: NEGATIVE

## 2023-09-27 LAB — COMPREHENSIVE METABOLIC PANEL
ALT: 22 U/L (ref 0–44)
AST: 11 U/L — ABNORMAL LOW (ref 15–41)
Albumin: 3.4 g/dL — ABNORMAL LOW (ref 3.5–5.0)
Alkaline Phosphatase: 82 U/L (ref 38–126)
Anion gap: 9 (ref 5–15)
BUN: 14 mg/dL (ref 6–20)
CO2: 22 mmol/L (ref 22–32)
Calcium: 8.5 mg/dL — ABNORMAL LOW (ref 8.9–10.3)
Chloride: 105 mmol/L (ref 98–111)
Creatinine, Ser: 0.46 mg/dL (ref 0.44–1.00)
GFR, Estimated: >60 mL/min (ref >60)
Glucose, Bld: 105 mg/dL — ABNORMAL HIGH (ref 70–99)
Potassium: 3.4 mmol/L — ABNORMAL LOW (ref 3.5–5.1)
Sodium: 136 mmol/L (ref 135–145)
Total Bilirubin: 0.4 mg/dL (ref <1.2)
Total Protein: 6.8 g/dL (ref 6.5–8.1)

## 2023-09-27 LAB — CBC
HCT: 35.1 % — ABNORMAL LOW (ref 36.0–46.0)
Hemoglobin: 11.4 g/dL — ABNORMAL LOW (ref 12.0–15.0)
MCH: 28.5 pg (ref 26.0–34.0)
MCHC: 32.5 g/dL (ref 30.0–36.0)
MCV: 87.8 fL (ref 80.0–100.0)
Platelets: 418 10*3/uL — ABNORMAL HIGH (ref 150–400)
RBC: 4 MIL/uL (ref 3.87–5.11)
RDW: 13.3 % (ref 11.5–15.5)
WBC: 7.2 10*3/uL (ref 4.0–10.5)
nRBC: 0 % (ref 0.0–0.2)

## 2023-09-27 LAB — RAPID HIV SCREEN (HIV 1/2 AB+AG)
HIV 1/2 Antibodies: NONREACTIVE
HIV-1 P24 Antigen - HIV24: NONREACTIVE

## 2023-09-27 MED ORDER — CLINDAMYCIN PHOSPHATE 900 MG/50ML IV SOLN
900.0000 mg | INTRAVENOUS | Status: AC
  Administered 2023-09-28: 900 mg via INTRAVENOUS

## 2023-09-27 MED ORDER — DEXTROSE 5 % IV SOLN
5.0000 mg/kg | INTRAVENOUS | Status: AC
  Administered 2023-09-28: 395.2 mg via INTRAVENOUS
  Filled 2023-09-27: qty 10

## 2023-09-28 ENCOUNTER — Other Ambulatory Visit: Payer: Self-pay

## 2023-09-28 ENCOUNTER — Encounter (HOSPITAL_COMMUNITY): Payer: Self-pay | Admitting: Obstetrics & Gynecology

## 2023-09-28 ENCOUNTER — Observation Stay (HOSPITAL_COMMUNITY): Payer: Medicaid Other | Admitting: Anesthesiology

## 2023-09-28 ENCOUNTER — Observation Stay (HOSPITAL_COMMUNITY)
Admission: RE | Admit: 2023-09-28 | Discharge: 2023-09-29 | Disposition: A | Discharge status: Home / Self Care | Payer: Medicaid Other | Attending: Obstetrics & Gynecology | Admitting: Obstetrics & Gynecology

## 2023-09-28 ENCOUNTER — Encounter (HOSPITAL_COMMUNITY)
Admission: RE | Disposition: A | Discharge status: Home / Self Care | Payer: Self-pay | Source: Home / Self Care | Attending: Obstetrics & Gynecology

## 2023-09-28 ENCOUNTER — Observation Stay (HOSPITAL_BASED_OUTPATIENT_CLINIC_OR_DEPARTMENT_OTHER): Payer: Medicaid Other | Admitting: Anesthesiology

## 2023-09-28 DIAGNOSIS — N979 Female infertility, unspecified: Secondary | ICD-10-CM | POA: Insufficient documentation

## 2023-09-28 DIAGNOSIS — N946 Dysmenorrhea, unspecified: Secondary | ICD-10-CM | POA: Diagnosis not present

## 2023-09-28 DIAGNOSIS — D259 Leiomyoma of uterus, unspecified: Secondary | ICD-10-CM

## 2023-09-28 DIAGNOSIS — Z79899 Other long term (current) drug therapy: Secondary | ICD-10-CM | POA: Diagnosis not present

## 2023-09-28 DIAGNOSIS — I10 Essential (primary) hypertension: Secondary | ICD-10-CM | POA: Insufficient documentation

## 2023-09-28 DIAGNOSIS — N920 Excessive and frequent menstruation with regular cycle: Secondary | ICD-10-CM | POA: Diagnosis not present

## 2023-09-28 DIAGNOSIS — F17210 Nicotine dependence, cigarettes, uncomplicated: Secondary | ICD-10-CM | POA: Insufficient documentation

## 2023-09-28 DIAGNOSIS — E119 Type 2 diabetes mellitus without complications: Principal | ICD-10-CM | POA: Insufficient documentation

## 2023-09-28 DIAGNOSIS — N972 Female infertility of uterine origin: Secondary | ICD-10-CM | POA: Diagnosis not present

## 2023-09-28 DIAGNOSIS — N858 Other specified noninflammatory disorders of uterus: Secondary | ICD-10-CM | POA: Diagnosis not present

## 2023-09-28 DIAGNOSIS — D219 Benign neoplasm of connective and other soft tissue, unspecified: Secondary | ICD-10-CM | POA: Diagnosis present

## 2023-09-28 DIAGNOSIS — Z9889 Other specified postprocedural states: Secondary | ICD-10-CM

## 2023-09-28 DIAGNOSIS — R102 Pelvic and perineal pain: Secondary | ICD-10-CM

## 2023-09-28 HISTORY — PX: MYOMECTOMY: SHX85

## 2023-09-28 LAB — GLUCOSE, CAPILLARY
Glucose-Capillary: 167 mg/dL — ABNORMAL HIGH (ref 70–99)
Glucose-Capillary: 228 mg/dL — ABNORMAL HIGH (ref 70–99)
Glucose-Capillary: 241 mg/dL — ABNORMAL HIGH (ref 70–99)

## 2023-09-28 LAB — ABO/RH: ABO/RH(D): O POS

## 2023-09-28 SURGERY — MYOMECTOMY, ABDOMINAL APPROACH
Anesthesia: General | Site: Abdomen

## 2023-09-28 MED ORDER — ORAL CARE MOUTH RINSE: 15.0000 mL | Freq: Once | OROMUCOSAL | Status: AC

## 2023-09-28 MED ORDER — LACTATED RINGERS IV SOLN: INTRAVENOUS | Status: DC | PRN

## 2023-09-28 MED ORDER — SODIUM CHLORIDE 0.9 % IV SOLN: 25.0000 mg | Freq: Three times a day (TID) | INTRAVENOUS | Status: DC | PRN

## 2023-09-28 MED ORDER — PROPOFOL 10 MG/ML IV BOLUS
INTRAVENOUS | Status: AC
  Filled 2023-09-28: qty 20

## 2023-09-28 MED ORDER — DIPHENHYDRAMINE HCL 50 MG/ML IJ SOLN: 25.0000 mg | Freq: Four times a day (QID) | INTRAMUSCULAR | Status: DC | PRN

## 2023-09-28 MED ORDER — 0.9 % SODIUM CHLORIDE (POUR BTL) OPTIME
TOPICAL | Status: DC | PRN
  Administered 2023-09-28: 1000 mL

## 2023-09-28 MED ORDER — OXYCODONE HCL 5 MG PO TABS: 5.0000 mg | ORAL_TABLET | Freq: Once | ORAL | Status: DC | PRN

## 2023-09-28 MED ORDER — MIDAZOLAM HCL 2 MG/2ML IJ SOLN
INTRAMUSCULAR | Status: AC
  Filled 2023-09-28: qty 2

## 2023-09-28 MED ORDER — SCOPOLAMINE 1 MG/3DAYS TD PT72
MEDICATED_PATCH | TRANSDERMAL | Status: AC
  Filled 2023-09-28: qty 1

## 2023-09-28 MED ORDER — KETOROLAC TROMETHAMINE 30 MG/ML IJ SOLN: 30.0000 mg | Freq: Once | INTRAMUSCULAR | Status: AC

## 2023-09-28 MED ORDER — LISINOPRIL 10 MG PO TABS
10.0000 mg | ORAL_TABLET | Freq: Every day | ORAL | Status: DC
  Administered 2023-09-28 – 2023-09-29 (×2): 10 mg via ORAL
  Filled 2023-09-28 (×2): qty 1

## 2023-09-28 MED ORDER — ZOLPIDEM TARTRATE 5 MG PO TABS: 5.0000 mg | ORAL_TABLET | Freq: Every evening | ORAL | Status: DC | PRN

## 2023-09-28 MED ORDER — PROPOFOL 10 MG/ML IV BOLUS
INTRAVENOUS | Status: DC | PRN
  Administered 2023-09-28: 200 mg via INTRAVENOUS

## 2023-09-28 MED ORDER — MIDAZOLAM HCL 2 MG/2ML IJ SOLN
INTRAMUSCULAR | Status: DC | PRN
  Administered 2023-09-28: 2 mg via INTRAVENOUS

## 2023-09-28 MED ORDER — KETOROLAC TROMETHAMINE 30 MG/ML IJ SOLN
INTRAMUSCULAR | Status: AC
  Administered 2023-09-28: 30 mg via INTRAVENOUS
  Filled 2023-09-28: qty 1

## 2023-09-28 MED ORDER — ACETAMINOPHEN 10 MG/ML IV SOLN
INTRAVENOUS | Status: DC | PRN
  Administered 2023-09-28: 1000 mg via INTRAVENOUS

## 2023-09-28 MED ORDER — ONDANSETRON HCL 4 MG/2ML IJ SOLN
4.0000 mg | Freq: Once | INTRAMUSCULAR | Status: AC | PRN
  Administered 2023-09-28: 4 mg via INTRAVENOUS
  Filled 2023-09-28: qty 2

## 2023-09-28 MED ORDER — PROPOFOL 500 MG/50ML IV EMUL
INTRAVENOUS | Status: DC | PRN
  Administered 2023-09-28: 25 ug/kg/min via INTRAVENOUS

## 2023-09-28 MED ORDER — FENTANYL CITRATE (PF) 250 MCG/5ML IJ SOLN
INTRAMUSCULAR | Status: DC | PRN
  Administered 2023-09-28: 50 ug via INTRAVENOUS
  Administered 2023-09-28: 100 ug via INTRAVENOUS
  Administered 2023-09-28 (×2): 50 ug via INTRAVENOUS

## 2023-09-28 MED ORDER — SUGAMMADEX SODIUM 200 MG/2ML IV SOLN
INTRAVENOUS | Status: DC | PRN
  Administered 2023-09-28: 200 mg via INTRAVENOUS

## 2023-09-28 MED ORDER — SODIUM CHLORIDE 0.9 % IV SOLN: 8.0000 mg | Freq: Three times a day (TID) | INTRAVENOUS | Status: DC | PRN

## 2023-09-28 MED ORDER — METFORMIN HCL 500 MG PO TABS
1000.0000 mg | ORAL_TABLET | Freq: Two times a day (BID) | ORAL | Status: DC
  Administered 2023-09-29: 1000 mg via ORAL
  Filled 2023-09-28: qty 2

## 2023-09-28 MED ORDER — FENTANYL CITRATE PF 50 MCG/ML IJ SOSY
25.0000 ug | PREFILLED_SYRINGE | INTRAMUSCULAR | Status: DC | PRN
Start: 2023-09-28 — End: 2023-09-28
  Administered 2023-09-28: 50 ug via INTRAVENOUS
  Filled 2023-09-28: qty 1

## 2023-09-28 MED ORDER — DEXMEDETOMIDINE HCL IN NACL 80 MCG/20ML IV SOLN
INTRAVENOUS | Status: AC
  Filled 2023-09-28: qty 20

## 2023-09-28 MED ORDER — VASOPRESSIN 20 UNIT/ML IV SOLN
INTRAVENOUS | Status: AC
Start: 2023-09-28 — End: ?
  Filled 2023-09-28: qty 1

## 2023-09-28 MED ORDER — ROCURONIUM BROMIDE 10 MG/ML (PF) SYRINGE
PREFILLED_SYRINGE | INTRAVENOUS | Status: DC | PRN
  Administered 2023-09-28: 50 mg via INTRAVENOUS
  Administered 2023-09-28 (×2): 10 mg via INTRAVENOUS

## 2023-09-28 MED ORDER — ACETAMINOPHEN 10 MG/ML IV SOLN
INTRAVENOUS | Status: AC
  Filled 2023-09-28: qty 100

## 2023-09-28 MED ORDER — ONDANSETRON HCL 4 MG PO TABS: 8.0000 mg | ORAL_TABLET | Freq: Four times a day (QID) | ORAL | Status: DC | PRN

## 2023-09-28 MED ORDER — BISACODYL 10 MG RE SUPP: 10.0000 mg | Freq: Every day | RECTAL | Status: DC | PRN

## 2023-09-28 MED ORDER — TRANEXAMIC ACID-NACL 1000-0.7 MG/100ML-% IV SOLN
INTRAVENOUS | Status: DC | PRN
  Administered 2023-09-28: 1000 mg via INTRAVENOUS

## 2023-09-28 MED ORDER — VASOPRESSIN 20 UNIT/ML IV SOLN
INTRAVENOUS | Status: DC | PRN
  Administered 2023-09-28: 26 mL via INTRAMUSCULAR

## 2023-09-28 MED ORDER — CLINDAMYCIN PHOSPHATE 900 MG/50ML IV SOLN
INTRAVENOUS | Status: AC
  Filled 2023-09-28: qty 50

## 2023-09-28 MED ORDER — SCOPOLAMINE 1 MG/3DAYS TD PT72
MEDICATED_PATCH | TRANSDERMAL | Status: DC | PRN
  Administered 2023-09-28: 1 via TRANSDERMAL

## 2023-09-28 MED ORDER — POVIDONE-IODINE 10 % EX SWAB: 2.0000 | Freq: Once | CUTANEOUS | Status: DC

## 2023-09-28 MED ORDER — FENTANYL CITRATE (PF) 250 MCG/5ML IJ SOLN
INTRAMUSCULAR | Status: AC
  Filled 2023-09-28: qty 5

## 2023-09-28 MED ORDER — SENNOSIDES-DOCUSATE SODIUM 8.6-50 MG PO TABS
1.0000 | ORAL_TABLET | Freq: Every evening | ORAL | Status: DC | PRN
Start: 2023-09-28 — End: 2023-09-29

## 2023-09-28 MED ORDER — ALUM & MAG HYDROXIDE-SIMETH 200-200-20 MG/5ML PO SUSP: 30.0000 mL | ORAL | Status: DC | PRN

## 2023-09-28 MED ORDER — DEXMEDETOMIDINE HCL IN NACL 80 MCG/20ML IV SOLN
INTRAVENOUS | Status: DC | PRN
  Administered 2023-09-28: 8 ug via INTRAVENOUS
  Administered 2023-09-28: 20 ug via INTRAVENOUS

## 2023-09-28 MED ORDER — LIDOCAINE HCL (PF) 2 % IJ SOLN
INTRAMUSCULAR | Status: DC | PRN
  Administered 2023-09-28: 100 mg via INTRADERMAL

## 2023-09-28 MED ORDER — BUPIVACAINE-MELOXICAM ER 200-6 MG/7ML IJ SOLN
200.0000 mg | Freq: Once | INTRAMUSCULAR | Status: DC
  Filled 2023-09-28: qty 1

## 2023-09-28 MED ORDER — CHLORHEXIDINE GLUCONATE 0.12 % MT SOLN
15.0000 mL | Freq: Once | OROMUCOSAL | Status: AC
  Administered 2023-09-28: 15 mL via OROMUCOSAL

## 2023-09-28 MED ORDER — DEXAMETHASONE SODIUM PHOSPHATE 10 MG/ML IJ SOLN
INTRAMUSCULAR | Status: AC
  Filled 2023-09-28: qty 1

## 2023-09-28 MED ORDER — PHENYLEPHRINE 80 MCG/ML (10ML) SYRINGE FOR IV PUSH (FOR BLOOD PRESSURE SUPPORT)
PREFILLED_SYRINGE | INTRAVENOUS | Status: DC | PRN
  Administered 2023-09-28: 160 ug via INTRAVENOUS

## 2023-09-28 MED ORDER — OXYCODONE HCL 5 MG PO TABS
5.0000 mg | ORAL_TABLET | ORAL | Status: DC | PRN
  Administered 2023-09-28 – 2023-09-29 (×2): 5 mg via ORAL
  Filled 2023-09-28 (×2): qty 1

## 2023-09-28 MED ORDER — NORETHINDRONE 0.35 MG PO TABS: 1.0000 | ORAL_TABLET | Freq: Every day | ORAL | Status: DC

## 2023-09-28 MED ORDER — FENTANYL CITRATE PF 50 MCG/ML IJ SOSY: 50.0000 ug | PREFILLED_SYRINGE | INTRAMUSCULAR | Status: DC | PRN

## 2023-09-28 MED ORDER — PHENYLEPHRINE 80 MCG/ML (10ML) SYRINGE FOR IV PUSH (FOR BLOOD PRESSURE SUPPORT)
PREFILLED_SYRINGE | INTRAVENOUS | Status: AC
Start: 2023-09-28 — End: ?
  Filled 2023-09-28: qty 10

## 2023-09-28 MED ORDER — BUPIVACAINE-MELOXICAM ER 200-6 MG/7ML IJ SOLN
INTRAMUSCULAR | Status: DC | PRN
  Administered 2023-09-28: 7 mL

## 2023-09-28 MED ORDER — GABAPENTIN 300 MG PO CAPS
300.0000 mg | ORAL_CAPSULE | Freq: Three times a day (TID) | ORAL | Status: DC
  Administered 2023-09-28 – 2023-09-29 (×2): 300 mg via ORAL
  Filled 2023-09-28 (×2): qty 1

## 2023-09-28 MED ORDER — KCL IN DEXTROSE-NACL 20-5-0.45 MEQ/L-%-% IV SOLN: INTRAVENOUS | Status: DC

## 2023-09-28 MED ORDER — ONDANSETRON HCL 4 MG/2ML IJ SOLN
INTRAMUSCULAR | Status: DC | PRN
  Administered 2023-09-28: 4 mg via INTRAVENOUS

## 2023-09-28 MED ORDER — KETOROLAC TROMETHAMINE 30 MG/ML IJ SOLN
30.0000 mg | Freq: Four times a day (QID) | INTRAMUSCULAR | Status: AC
Start: 2023-09-28 — End: 2023-09-29
  Administered 2023-09-28 – 2023-09-29 (×4): 30 mg via INTRAVENOUS
  Filled 2023-09-28 (×4): qty 1

## 2023-09-28 MED ORDER — ROCURONIUM BROMIDE 10 MG/ML (PF) SYRINGE
PREFILLED_SYRINGE | INTRAVENOUS | Status: AC
  Filled 2023-09-28: qty 10

## 2023-09-28 MED ORDER — LIDOCAINE HCL (PF) 2 % IJ SOLN
INTRAMUSCULAR | Status: AC
  Filled 2023-09-28: qty 5

## 2023-09-28 MED ORDER — TRANEXAMIC ACID-NACL 1000-0.7 MG/100ML-% IV SOLN
INTRAVENOUS | Status: AC
  Filled 2023-09-28: qty 100

## 2023-09-28 MED ORDER — LACTATED RINGERS IV SOLN: INTRAVENOUS | Status: DC

## 2023-09-28 MED ORDER — GLIPIZIDE 5 MG PO TABS
5.0000 mg | ORAL_TABLET | Freq: Two times a day (BID) | ORAL | Status: DC
  Administered 2023-09-29: 5 mg via ORAL
  Filled 2023-09-28: qty 1

## 2023-09-28 MED ORDER — OXYCODONE HCL 5 MG/5ML PO SOLN: 5.0000 mg | Freq: Once | ORAL | Status: DC | PRN

## 2023-09-28 MED ORDER — ONDANSETRON HCL 4 MG/2ML IJ SOLN
INTRAMUSCULAR | Status: AC
  Filled 2023-09-28: qty 2

## 2023-09-28 MED ORDER — IBUPROFEN 600 MG PO TABS: 600.0000 mg | ORAL_TABLET | Freq: Four times a day (QID) | ORAL | Status: DC

## 2023-09-28 MED ORDER — DEXAMETHASONE SODIUM PHOSPHATE 10 MG/ML IJ SOLN
INTRAMUSCULAR | Status: DC | PRN
  Administered 2023-09-28: 5 mg via INTRAVENOUS

## 2023-09-28 SURGICAL SUPPLY — 43 items
APPLIER CLIP 13 LRG OPEN (CLIP)
CLIP APPLIE 13 LRG OPEN (CLIP) IMPLANT
CLOTH BEACON ORANGE TIMEOUT ST (SAFETY) ×1 IMPLANT
COVER LIGHT HANDLE STERIS (MISCELLANEOUS) ×2 IMPLANT
DERMABOND ADVANCED .7 DNX12 (GAUZE/BANDAGES/DRESSINGS) ×1 IMPLANT
DRAPE WARM FLUID 44X44 (DRAPES) ×1 IMPLANT
DRSG OPSITE POSTOP 4X10 (GAUZE/BANDAGES/DRESSINGS) ×1 IMPLANT
DRSG OPSITE POSTOP 4X8 (GAUZE/BANDAGES/DRESSINGS) IMPLANT
ELECT NDL TIP 2.8 STRL (NEEDLE) ×1 IMPLANT
ELECT NEEDLE TIP 2.8 STRL (NEEDLE) ×1
ELECT REM PT RETURN 9FT ADLT (ELECTROSURGICAL) ×1
ELECTRODE REM PT RTRN 9FT ADLT (ELECTROSURGICAL) ×1 IMPLANT
GAUZE 4X4 16PLY ~~LOC~~+RFID DBL (SPONGE) ×1 IMPLANT
GLOVE BIOGEL PI IND STRL 7.0 (GLOVE) ×2 IMPLANT
GLOVE BIOGEL PI IND STRL 8 (GLOVE) ×1 IMPLANT
GLOVE ECLIPSE 8.0 STRL XLNG CF (GLOVE) ×2 IMPLANT
GOWN STRL REUS W/TWL LRG LVL3 (GOWN DISPOSABLE) ×2 IMPLANT
GOWN STRL REUS W/TWL XL LVL3 (GOWN DISPOSABLE) ×1 IMPLANT
INST SET MAJOR GENERAL (KITS) ×1 IMPLANT
KIT BLADEGUARD II DBL (SET/KITS/TRAYS/PACK) ×1 IMPLANT
KIT TURNOVER KIT A (KITS) ×1 IMPLANT
MANIFOLD NEPTUNE II (INSTRUMENTS) ×2 IMPLANT
NDL HYPO 18GX1.5 BLUNT FILL (NEEDLE) ×1 IMPLANT
NDL HYPO 25X1 1.5 SAFETY (NEEDLE) ×1 IMPLANT
NEEDLE HYPO 18GX1.5 BLUNT FILL (NEEDLE) ×1
NEEDLE HYPO 25X1 1.5 SAFETY (NEEDLE) ×1
NS IRRIG 1000ML POUR BTL (IV SOLUTION) ×2 IMPLANT
PACK MAJOR ABDOMINAL (CUSTOM PROCEDURE TRAY) ×1 IMPLANT
PAD ARMBOARD 7.5X6 YLW CONV (MISCELLANEOUS) ×1 IMPLANT
POSITIONER HEAD 8X9X4 ADT (SOFTGOODS) ×1 IMPLANT
POWDER SURGICEL 3.0 GRAM (HEMOSTASIS) IMPLANT
SET BASIN LINEN APH (SET/KITS/TRAYS/PACK) ×1 IMPLANT
SPONGE T-LAP 18X18 ~~LOC~~+RFID (SPONGE) ×1 IMPLANT
SUT CHROMIC 0 CT 1 (SUTURE) ×1 IMPLANT
SUT MON AB 0 CT1 (SUTURE) ×2 IMPLANT
SUT MON AB 3-0 SH 27 (SUTURE) ×2 IMPLANT
SUT VIC AB 0 CT1 27XCR 8 STRN (SUTURE) ×1 IMPLANT
SUT VIC AB 0 CTX36XBRD ANTBCTR (SUTURE) ×1 IMPLANT
SUT VICRYL 3 0 (SUTURE) ×1 IMPLANT
SYR 30ML LL (SYRINGE) ×1 IMPLANT
SYR 50ML LL SCALE MARK (SYRINGE) ×1 IMPLANT
TOWEL OR 17X26 4PK STRL BLUE (TOWEL DISPOSABLE) ×1 IMPLANT
TRAY FOL W/BAG SLVR 16FR STRL (SET/KITS/TRAYS/PACK) ×1 IMPLANT

## 2023-09-28 NOTE — Transfer of Care (Signed)
Immediate Anesthesia Transfer of Care Note  Patient: Mallory Mcdowell  Procedure(s) Performed: ABDOMINAL MYOMECTOMY (Abdomen)  Patient Location: PACU  Anesthesia Type:General  Level of Consciousness: drowsy and patient cooperative  Airway & Oxygen Therapy: Patient Spontanous Breathing and Patient connected to face mask oxygen  Post-op Assessment: Report given to RN and Post -op Vital signs reviewed and stable  Post vital signs: Reviewed and stable  Last Vitals:  Vitals Value Taken Time  BP 129/93 09/28/23 1536  Temp 36.3 C 09/28/23 1536  Pulse 83 09/28/23 1541  Resp 20 09/28/23 1541  SpO2 100 % 09/28/23 1541  Vitals shown include unfiled device data.  Last Pain:  Vitals:   09/28/23 1536  TempSrc:   PainSc: Asleep         Complications: No notable events documented.

## 2023-09-28 NOTE — H&P (Signed)
Preoperative History and Physical  Mallory Mcdowell is a 37 y.o. G0P0000 with Patient's last menstrual period was 09/20/2023 (exact date). admitted for a abdominal approach myomectomy, [facility] surgeon declined to do RA myomectomy. Multpile fibroids largest 9.7 cm, volume 620 cc   PMH:    Past Medical History:  Diagnosis Date   Diabetes mellitus without complication (HCC)    Hypertension     PSH:     Past Surgical History:  Procedure Laterality Date   NO PAST SURGERIES      POb/GynH:      OB History     Gravida  0   Para  0   Term  0   Preterm  0   AB  0   Living  0      SAB  0   IAB  0   Ectopic  0   Multiple  0   Live Births              SH:   Social History   Tobacco Use   Smoking status: Every Day    Current packs/day: 0.50    Types: Cigarettes   Smokeless tobacco: Never  Vaping Use   Vaping status: Never Used  Substance Use Topics   Alcohol use: Yes    Alcohol/week: 1.0 standard drink of alcohol    Types: 1 Cans of beer per week    Comment: sometimes daily, but not always   Drug use: Yes    Types: Marijuana    Comment: last used 09/26/2023 edible on sunday, blunt.    FH:    Family History  Problem Relation Age of Onset   Hypertension Maternal Grandmother    Diabetes Maternal Grandmother    Asthma Mother    Diabetes Other    Heart failure Other    Cancer Other      Allergies:  Allergies  Allergen Reactions   Amoxicillin Swelling and Anaphylaxis    Medications:       Current Facility-Administered Medications:    bupivacaine-meloxicam ER (ZYNRELEF) injection 200 mg, 200 mg, Infiltration, Once, Daesia Zylka, Amaryllis Dyke, MD   chlorhexidine (PERIDEX) 0.12 % solution 15 mL, 15 mL, Mouth/Throat, Once **OR** Oral care mouth rinse, 15 mL, Mouth Rinse, Once, Kiel, Mosetta Putt, MD   clindamycin (CLEOCIN) 900 MG/50ML IVPB, , , ,    clindamycin (CLEOCIN) IVPB 900 mg, 900 mg, Intravenous, 60 min Pre-Op **AND** gentamicin (GARAMYCIN) 400 mg in  dextrose 5 % 100 mL IVPB, 5 mg/kg, Intravenous, 60 min Pre-Op, Stelios Kirby, Amaryllis Dyke, MD   ketorolac (TORADOL) 30 MG/ML injection 30 mg, 30 mg, Intravenous, Once, Lazaro Arms, MD   ketorolac (TORADOL) 30 MG/ML injection, , , ,    lactated ringers infusion, , Intravenous, Continuous, Kiel, Mosetta Putt, MD   povidone-iodine 10 % swab 2 Application, 2 Application, Topical, Once, Lazaro Arms, MD  Review of Systems:   Review of Systems  Constitutional: Negative for fever, chills, weight loss, malaise/fatigue and diaphoresis.  HENT: Negative for hearing loss, ear pain, nosebleeds, congestion, sore throat, neck pain, tinnitus and ear discharge.   Eyes: Negative for blurred vision, double vision, photophobia, pain, discharge and redness.  Respiratory: Negative for cough, hemoptysis, sputum production, shortness of breath, wheezing and stridor.   Cardiovascular: Negative for chest pain, palpitations, orthopnea, claudication, leg swelling and PND.  Gastrointestinal: Positive for abdominal pain. Negative for heartburn, nausea, vomiting, diarrhea, constipation, blood in stool and melena.  Genitourinary: Negative for dysuria, urgency, frequency, hematuria  and flank pain.  Musculoskeletal: Negative for myalgias, back pain, joint pain and falls.  Skin: Negative for itching and rash.  Neurological: Negative for dizziness, tingling, tremors, sensory change, speech change, focal weakness, seizures, loss of consciousness, weakness and headaches.  Endo/Heme/Allergies: Negative for environmental allergies and polydipsia. Does not bruise/bleed easily.  Psychiatric/Behavioral: Negative for depression, suicidal ideas, hallucinations, memory loss and substance abuse. The patient is not nervous/anxious and does not have insomnia.      PHYSICAL EXAM:  Blood pressure 113/69, pulse 88, temperature 98.2 F (36.8 C), temperature source Oral, resp. rate (!) 24, height 5\' 5"  (1.651 m), weight 79 kg, last menstrual period  09/20/2023.    Vitals reviewed. Constitutional: She is oriented to person, place, and time. She appears well-developed and well-nourished.  HENT:  Head: Normocephalic and atraumatic.  Right Ear: External ear normal.  Left Ear: External ear normal.  Nose: Nose normal.  Mouth/Throat: Oropharynx is clear and moist.  Eyes: Conjunctivae and EOM are normal. Pupils are equal, round, and reactive to light. Right eye exhibits no discharge. Left eye exhibits no discharge. No scleral icterus.  Neck: Normal range of motion. Neck supple. No tracheal deviation present. No thyromegaly present.  Cardiovascular: Normal rate, regular rhythm, normal heart sounds and intact distal pulses.  Exam reveals no gallop and no friction rub.   No murmur heard. Respiratory: Effort normal and breath sounds normal. No respiratory distress. She has no wheezes. She has no rales. She exhibits no tenderness.  GI: Soft. Bowel sounds are normal. She exhibits no distension and no mass. There is tenderness. There is no rebound and no guarding.  Genitourinary:       Vulva is normal without lesions Vagina is pink moist without discharge Cervix normal in appearance and pap is normal Uterus is 12-14 weeks size on exam Adnexa is negative with normal sized ovaries by sonogram  Musculoskeletal: Normal range of motion. She exhibits no edema and no tenderness.  Neurological: She is alert and oriented to person, place, and time. She has normal reflexes. She displays normal reflexes. No cranial nerve deficit. She exhibits normal muscle tone. Coordination normal.  Skin: Skin is warm and dry. No rash noted. No erythema. No pallor.  Psychiatric: She has a normal mood and affect. Her behavior is normal. Judgment and thought content normal.    Labs: Results for orders placed or performed during the hospital encounter of 09/28/23 (from the past 336 hour(s))  Glucose, capillary   Collection Time: 09/28/23 12:58 PM  Result Value Ref Range    Glucose-Capillary 167 (H) 70 - 99 mg/dL  Results for orders placed or performed during the hospital encounter of 09/27/23 (from the past 336 hour(s))  CBC   Collection Time: 09/27/23  1:08 PM  Result Value Ref Range   WBC 7.2 4.0 - 10.5 K/uL   RBC 4.00 3.87 - 5.11 MIL/uL   Hemoglobin 11.4 (L) 12.0 - 15.0 g/dL   HCT 32.4 (L) 40.1 - 02.7 %   MCV 87.8 80.0 - 100.0 fL   MCH 28.5 26.0 - 34.0 pg   MCHC 32.5 30.0 - 36.0 g/dL   RDW 25.3 66.4 - 40.3 %   Platelets 418 (H) 150 - 400 K/uL   nRBC 0.0 0.0 - 0.2 %  Comprehensive metabolic panel   Collection Time: 09/27/23  1:08 PM  Result Value Ref Range   Sodium 136 135 - 145 mmol/L   Potassium 3.4 (L) 3.5 - 5.1 mmol/L   Chloride 105 98 -  111 mmol/L   CO2 22 22 - 32 mmol/L   Glucose, Bld 105 (H) 70 - 99 mg/dL   BUN 14 6 - 20 mg/dL   Creatinine, Ser 8.29 0.44 - 1.00 mg/dL   Calcium 8.5 (L) 8.9 - 10.3 mg/dL   Total Protein 6.8 6.5 - 8.1 g/dL   Albumin 3.4 (L) 3.5 - 5.0 g/dL   AST 11 (L) 15 - 41 U/L   ALT 22 0 - 44 U/L   Alkaline Phosphatase 82 38 - 126 U/L   Total Bilirubin 0.4 <1.2 mg/dL   GFR, Estimated >56 >21 mL/min   Anion gap 9 5 - 15  Rapid HIV screen (HIV 1/2 Ab+Ag)   Collection Time: 09/27/23  1:08 PM  Result Value Ref Range   HIV-1 P24 Antigen - HIV24 NON REACTIVE NON REACTIVE   HIV 1/2 Antibodies NON REACTIVE NON REACTIVE   Interpretation (HIV Ag Ab)      A non reactive test result means that HIV 1 or HIV 2 antibodies and HIV 1 p24 antigen were not detected in the specimen.  Urinalysis, Routine w reflex microscopic -Urine, Clean Catch   Collection Time: 09/27/23  1:08 PM  Result Value Ref Range   Color, Urine YELLOW YELLOW   APPearance HAZY (A) CLEAR   Specific Gravity, Urine 1.021 1.005 - 1.030   pH 5.0 5.0 - 8.0   Glucose, UA NEGATIVE NEGATIVE mg/dL   Hgb urine dipstick SMALL (A) NEGATIVE   Bilirubin Urine NEGATIVE NEGATIVE   Ketones, ur NEGATIVE NEGATIVE mg/dL   Protein, ur NEGATIVE NEGATIVE mg/dL   Nitrite NEGATIVE  NEGATIVE   Leukocytes,Ua NEGATIVE NEGATIVE   RBC / HPF 0-5 0 - 5 RBC/hpf   WBC, UA 0-5 0 - 5 WBC/hpf   Bacteria, UA NONE SEEN NONE SEEN   Squamous Epithelial / HPF 0-5 0 - 5 /HPF   Mucus PRESENT   Pregnancy, urine   Collection Time: 09/27/23  1:08 PM  Result Value Ref Range   Preg Test, Ur NEGATIVE NEGATIVE  Type and screen   Collection Time: 09/27/23  1:08 PM  Result Value Ref Range   ABO/RH(D) O POS    Antibody Screen NEG    Sample Expiration 10/11/2023,2359    Extend sample reason      NO TRANSFUSIONS OR PREGNANCY IN THE PAST 3 MONTHS Performed at [facility], [address] [place], [place] [address]     EKG: Orders placed or performed during the hospital encounter of 09/27/23   EKG 12-Lead   EKG 12-Lead    Imaging Studies: No results found.    Assessment: Enlarged fibroid uterus 620 cc Primary infertility Menorrhagia dysmenorrhea  Plan: Open abdominal myomectomy  Pt understands the risks of surgery including but not limited t  excessive bleeding requiring transfusion or reoperation, post-operative infection requiring prolonged hospitalization or re-hospitalization and antibiotic therapy, and damage to other organs including bladder, bowel, ureters and major vessels.  The patient also understands the alternative treatment options which were discussed in full.  All questions were answered.  Amaryllis Dyke Adelin Ventrella 09/28/2023 1:23 PM   Amaryllis Dyke Aubert Choyce 09/28/2023 1:20 PM

## 2023-09-28 NOTE — Op Note (Signed)
Preoperative diagnosis: Enlarged fibroid uterus, multiple fibroids, largest 9.7 cm Total uterine volume 620 cc Menorrhagia Dysmenorrhea Primary infertility  Postop diagnosis: Same as above  Procedure: Uterine myomectomy, multiple, abdominal approach  Surgeon: Lazaro Arms, MD   Anesthesia: General Tracheal  Findings: Patient had preoperative sonogram and pelvic MRI which revealed multiple fibroids largest which was 9.7 cm Total uterine volume was 620 cc and this was reflective of the findings of surgery today Tubes and ovaries were otherwise normal There were no intraperitoneal abnormalities I did not enter the endometrium but I would not recommend labor in this patient because of the large posterior uterine defect      I removed 6 fibroids and a small amount of myometrium which was redundant after removal of the fibroids The largest fibroid was very multinodular All the fibroids were very hard and calcified No fibroids were left behind  Description of operation: Patient was taken the operating room placed in the supine position where she underwent general endotracheal anesthesia She was prepped and draped in the usual sterile fashion a Foley catheter was placed Pfannenstiel skin incision was made and carried down sharply the rectus fascia The fascia was scored in the midline and extended laterally The muscles were divided and the peritoneal cavity was entered The uterus was delivered through the incision The uterus was kept moist throughout with a wet towel underneath it and wet lap pads 20 cc of dilute vasopressin solution was injected throughout the procedure(20 units of vasopressin and 100 cc of normal saline) Needle tip Bovie was used and an incision was made in the posterior uterine wall which was used to remove all of the fibroids Each capsule was incised with a needle tip Bovie and each fibroid was grasped with a thyroid tenaculum and bluntly dissected out of  its capsular bed All of the capsular beds were hemostatic because of the use of vasopressin and blunt dissection  The uterus was closed in 4 layers Initial layers were reapproximating myometrium and the outer layer was a baseball style stitch to close the peritoneum It was good hemostasis Was replaced into the peritoneal cavity observed for several minutes  The pelvis was irrigated vigorously and again everything was hemostatic  Tubes and ovaries were normal Fully catheter was removed  Also some peritoneum reapproximated loosely The fascia was closed using 0 Vicryl running Zynrelef was placed on top of the muscles and on top of the fascia and the subcutaneous layer for postop pain management Subcutaneous tissue was made hemostatic The skin was closed using 3-0 Vicryl on a Keith needle in a subcuticular fashion  Patient tolerated procedure well She experienced 200 cc of blood loss intraoperatively  She received 2 g of Ancef and 30 mg of Toradol preoperatively prophylactically  She will be evaluated in the recovery room for possible discharge as that is her desire but we will see how she is doing with her pain and nausea management  Lazaro Arms, MD 09/28/2023 3:36 PM

## 2023-09-28 NOTE — Anesthesia Procedure Notes (Signed)
Procedure Name: Intubation Date/Time: 09/28/2023 1:51 PM  Performed by: Julian Reil, CRNAPre-anesthesia Checklist: Patient identified, Emergency Drugs available, Suction available and Patient being monitored Patient Re-evaluated:Patient Re-evaluated prior to induction Oxygen Delivery Method: Circle system utilized Preoxygenation: Pre-oxygenation with 100% oxygen Induction Type: IV induction Ventilation: Mask ventilation without difficulty and Oral airway inserted - appropriate to patient size Laryngoscope Size: Mac and 4 Grade View: Grade II Tube type: Oral Tube size: 7.0 mm Number of attempts: 1 Airway Equipment and Method: Stylet and Oral airway Placement Confirmation: ETT inserted through vocal cords under direct vision, positive ETCO2 and breath sounds checked- equal and bilateral Secured at: 22 cm Tube secured with: Tape Dental Injury: Teeth and Oropharynx as per pre-operative assessment

## 2023-09-28 NOTE — Anesthesia Preprocedure Evaluation (Signed)
Anesthesia Evaluation  Patient identified by MRN, date of birth, ID band Patient awake    Reviewed: Allergy & Precautions, H&P , NPO status , Patient's Chart, lab work & pertinent test results, reviewed documented beta blocker date and time   Airway Mallampati: II  TM Distance: >3 FB Neck ROM: full    Dental no notable dental hx.    Pulmonary neg pulmonary ROS, Current Smoker   Pulmonary exam normal breath sounds clear to auscultation       Cardiovascular Exercise Tolerance: Good hypertension, negative cardio ROS  Rhythm:regular Rate:Normal     Neuro/Psych negative neurological ROS  negative psych ROS   GI/Hepatic negative GI ROS, Neg liver ROS,,,  Endo/Other  negative endocrine ROSdiabetes    Renal/GU negative Renal ROS  negative genitourinary   Musculoskeletal   Abdominal   Peds  Hematology negative hematology ROS (+)   Anesthesia Other Findings   Reproductive/Obstetrics negative OB ROS                             Anesthesia Physical Anesthesia Plan  ASA: 2  Anesthesia Plan: General and General ETT   Post-op Pain Management:    Induction:   PONV Risk Score and Plan: Ondansetron  Airway Management Planned:   Additional Equipment:   Intra-op Plan:   Post-operative Plan:   Informed Consent: I have reviewed the patients History and Physical, chart, labs and discussed the procedure including the risks, benefits and alternatives for the proposed anesthesia with the patient or authorized representative who has indicated his/her understanding and acceptance.     Dental Advisory Given  Plan Discussed with: CRNA  Anesthesia Plan Comments:        Anesthesia Quick Evaluation

## 2023-09-29 ENCOUNTER — Encounter: Payer: Self-pay | Admitting: *Deleted

## 2023-09-29 DIAGNOSIS — D259 Leiomyoma of uterus, unspecified: Secondary | ICD-10-CM | POA: Diagnosis not present

## 2023-09-29 LAB — BASIC METABOLIC PANEL
Anion gap: 10 (ref 5–15)
BUN: 7 mg/dL (ref 6–20)
CO2: 23 mmol/L (ref 22–32)
Calcium: 8.1 mg/dL — ABNORMAL LOW (ref 8.9–10.3)
Chloride: 100 mmol/L (ref 98–111)
Creatinine, Ser: 0.49 mg/dL (ref 0.44–1.00)
GFR, Estimated: >60 mL/min (ref >60)
Glucose, Bld: 184 mg/dL — ABNORMAL HIGH (ref 70–99)
Potassium: 3.6 mmol/L (ref 3.5–5.1)
Sodium: 133 mmol/L — ABNORMAL LOW (ref 135–145)

## 2023-09-29 LAB — CBC
HCT: 30.4 % — ABNORMAL LOW (ref 36.0–46.0)
Hemoglobin: 9.7 g/dL — ABNORMAL LOW (ref 12.0–15.0)
MCH: 27.7 pg (ref 26.0–34.0)
MCHC: 31.9 g/dL (ref 30.0–36.0)
MCV: 86.9 fL (ref 80.0–100.0)
Platelets: 323 10*3/uL (ref 150–400)
RBC: 3.5 MIL/uL — ABNORMAL LOW (ref 3.87–5.11)
RDW: 13 % (ref 11.5–15.5)
WBC: 11.6 10*3/uL — ABNORMAL HIGH (ref 4.0–10.5)
nRBC: 0 % (ref 0.0–0.2)

## 2023-09-29 MED ORDER — OXYCODONE HCL 5 MG PO TABS: 5.0000 mg | ORAL_TABLET | ORAL | 0 refills | Status: DC | PRN

## 2023-09-29 MED ORDER — KETOROLAC TROMETHAMINE 10 MG PO TABS: 10.0000 mg | ORAL_TABLET | Freq: Three times a day (TID) | ORAL | 0 refills | Status: DC | PRN

## 2023-09-29 MED ORDER — ONDANSETRON 8 MG PO TBDP: 8.0000 mg | ORAL_TABLET | Freq: Three times a day (TID) | ORAL | 0 refills | Status: DC | PRN

## 2023-09-29 NOTE — Inpatient Diabetes Management (Signed)
Inpatient Diabetes Program Recommendations  AACE/ADA: New Consensus Statement on Inpatient Glycemic Control (2015)  Target Ranges:  Prepandial:   less than 140 mg/dL      Peak postprandial:   less than 180 mg/dL (1-2 hours)      Critically ill patients:  140 - 180 mg/dL   Lab Results  Component Value Date   GLUCAP 228 (H) 09/28/2023   HGBA1C 8.3 (H) 08/20/2023    Review of Glycemic Control  Latest Reference Range & Units 09/28/23 15:52 09/28/23 23:49  Glucose-Capillary 70 - 99 mg/dL 182 (H) 993 (H)  (H): Data is abnormally high Diabetes history: Type 2 DM Outpatient Diabetes medications: Glipizide 5 mg BID, Metformin 1000 mg BID Current orders for Inpatient glycemic control: Metformin 1000 mg BID, Glipizide 5 mg BID  Inpatient Diabetes Program Recommendations:    Consider: -Adding Novolog 0-15 units TID & HS -Changing diet to carb modified if appropriate.  Thanks, Lujean Rave, MSN, RNC-OB Diabetes Coordinator [phone] (8a-5p)

## 2023-09-29 NOTE — Progress Notes (Signed)
Pt reports she does not have PCP. LCSW discussed with pt checking with Medicaid to see if assigned PCP. Will add PCP list to AVS as well.    09/29/23 0828  TOC Brief Assessment  Insurance and Status Reviewed  Patient has primary care physician No (Will add to AVS.)  Home environment has been reviewed Lives with brother.  Prior level of function: Independent.  Prior/Current Home Services No current home services  Social Determinants of Health Reivew SDOH reviewed no interventions necessary  Readmission risk has been reviewed Yes  Transition of care needs no transition of care needs at this time

## 2023-09-29 NOTE — Progress Notes (Signed)
MD made aware that pt current IVF is not on hand at this time. No new orders given at this time.

## 2023-09-29 NOTE — Discharge Summary (Signed)
Physician Discharge Summary  Patient ID: AMIKA REPPUCCI MRN: [id number] DOB/AGE: Apr 09, 1986 37 y.o.  Admit date: 09/28/2023 Discharge date: 09/29/2023  Admission Diagnoses: Fibroid uterus  Discharge Diagnoses:  Principal Problem:   S/P myomectomy Active Problems:   Fibroids   Uterine fibroids affecting pregnancy   Menorrhagia with regular cycle   Dysmenorrhea   Primary uterine infertility   Pelvic pain   Discharged Condition: stable  Hospital Course: unremarkable  Consults: None  Significant Diagnostic Studies:   Treatments: abdominal myomectomy  Discharge Exam: Blood pressure 132/86, pulse (!) 105, temperature 98.6 F (37 C), temperature source Oral, resp. rate 16, height 5\' 5"  (1.651 m), weight 79 kg, last menstrual period 09/20/2023, SpO2 100%. General appearance: alert, cooperative, and no distress GI: soft, non-tender; bowel sounds normal; no masses,  no organomegaly Incision/Wound:dressing is dry  Disposition: Discharge disposition: 01-Home or Self Care       Discharge Instructions     Diet - low sodium heart healthy   Complete by: As directed    Driving Restrictions   Complete by: As directed    Do not drive for 3 days   Increase activity slowly   Complete by: As directed    Lifting restrictions   Complete by: As directed    Do not lift more than 20 pounds for 6 weeks   Sexual Activity Restrictions   Complete by: As directed    No sex 6 weeks      Allergies as of 09/29/2023       Reactions   Amoxicillin Swelling, Anaphylaxis        Medication List     STOP taking these medications    megestrol 40 MG tablet Commonly known as: MEGACE   metroNIDAZOLE 500 MG tablet Commonly known as: FLAGYL       TAKE these medications    glipiZIDE 5 MG tablet Commonly known as: Glucotrol Take 1 tablet (5 mg total) by mouth 2 (two) times daily before a meal.   ketorolac 10 MG tablet Commonly known as: TORADOL Take 1 tablet (10 mg  total) by mouth every 8 (eight) hours as needed.   lisinopril 10 MG tablet Commonly known as: ZESTRIL Take 1 tablet (10 mg total) by mouth daily.   metFORMIN 1000 MG tablet Commonly known as: GLUCOPHAGE Take 1,000 mg by mouth 2 (two) times daily with a meal.   norethindrone 0.35 MG tablet Commonly known as: MICRONOR Take 1 tablet (0.35 mg total) by mouth daily.   ondansetron 8 MG disintegrating tablet Commonly known as: ZOFRAN-ODT Take 1 tablet (8 mg total) by mouth every 8 (eight) hours as needed for nausea or vomiting.   oxyCODONE 5 MG immediate release tablet Commonly known as: Oxy IR/ROXICODONE Take 1-2 tablets (5-10 mg total) by mouth every 4 (four) hours as needed for moderate pain (pain score 4-6).        Follow-up Information     Lazaro Arms, MD Follow up in 2 week(s).   Specialties: Obstetrics and Gynecology, Radiology Why: post op visit Contact information: [address] [place] [address] [phone]                 Signed: Lazaro Arms 09/29/2023, 1:14 PM

## 2023-09-29 NOTE — Discharge Instructions (Signed)

## 2023-09-29 NOTE — Plan of Care (Signed)
  Problem: Education: Goal: Knowledge of General Education information will improve Description: Including pain rating scale, medication(s)/side effects and non-pharmacologic comfort measures Outcome: Progressing   Problem: Health Behavior/Discharge Planning: Goal: Ability to manage health-related needs will improve Outcome: Progressing   Problem: Clinical Measurements: Goal: Ability to maintain clinical measurements within normal limits will improve Outcome: Progressing Goal: Will remain free from infection Outcome: Progressing Goal: Diagnostic test results will improve Outcome: Progressing Goal: Respiratory complications will improve Outcome: Progressing Goal: Cardiovascular complication will be avoided Outcome: Progressing   Problem: Activity: Goal: Risk for activity intolerance will decrease Outcome: Progressing   Problem: Nutrition: Goal: Adequate nutrition will be maintained Outcome: Progressing   Problem: Coping: Goal: Level of anxiety will decrease Outcome: Progressing   Problem: Elimination: Goal: Will not experience complications related to bowel motility Outcome: Progressing Goal: Will not experience complications related to urinary retention Outcome: Progressing   Problem: Pain Management: Goal: General experience of comfort will improve Outcome: Progressing   Problem: Safety: Goal: Ability to remain free from injury will improve Outcome: Progressing   Problem: Skin Integrity: Goal: Risk for impaired skin integrity will decrease Outcome: Progressing   Problem: Education: Goal: Knowledge of the prescribed therapeutic regimen will improve Outcome: Progressing Goal: Understanding of sexual limitations or changes related to disease process or condition will improve Outcome: Progressing Goal: Individualized Educational Video(s) Outcome: Progressing   Problem: Self-Concept: Goal: Communication of feelings regarding changes in body function or  appearance will improve Outcome: Progressing   Problem: Skin Integrity: Goal: Demonstration of wound healing without infection will improve Outcome: Progressing

## 2023-09-29 NOTE — Progress Notes (Signed)
Ng Discharge Note  Admit Date:  09/28/2023 Discharge date: 09/29/2023   Mallory Mcdowell to be D/C'd Home per MD order.  AVS completed. Patient/caregiver able to verbalize understanding.  Discharge Medication: Allergies as of 09/29/2023       Reactions   Amoxicillin Swelling, Anaphylaxis        Medication List     STOP taking these medications    megestrol 40 MG tablet Commonly known as: MEGACE   metroNIDAZOLE 500 MG tablet Commonly known as: FLAGYL       TAKE these medications    glipiZIDE 5 MG tablet Commonly known as: Glucotrol Take 1 tablet (5 mg total) by mouth 2 (two) times daily before a meal.   ketorolac 10 MG tablet Commonly known as: TORADOL Take 1 tablet (10 mg total) by mouth every 8 (eight) hours as needed.   lisinopril 10 MG tablet Commonly known as: ZESTRIL Take 1 tablet (10 mg total) by mouth daily.   metFORMIN 1000 MG tablet Commonly known as: GLUCOPHAGE Take 1,000 mg by mouth 2 (two) times daily with a meal.   norethindrone 0.35 MG tablet Commonly known as: MICRONOR Take 1 tablet (0.35 mg total) by mouth daily.   ondansetron 8 MG disintegrating tablet Commonly known as: ZOFRAN-ODT Take 1 tablet (8 mg total) by mouth every 8 (eight) hours as needed for nausea or vomiting.   oxyCODONE 5 MG immediate release tablet Commonly known as: Oxy IR/ROXICODONE Take 1-2 tablets (5-10 mg total) by mouth every 4 (four) hours as needed for moderate pain (pain score 4-6).        Discharge Assessment: Vitals:   09/29/23 0207 09/29/23 0550  BP: (!) 142/82 132/86  Pulse: (!) 102 (!) 105  Resp: 18 16  Temp: 98.8 F (37.1 C) 98.6 F (37 C)  SpO2: 100% 100%   Skin clean, dry and intact without evidence of skin break down, no evidence of skin tears noted. IV catheter discontinued intact. Site without signs and symptoms of complications - no redness or edema noted at insertion site, patient denies c/o pain - only slight tenderness at site.   Dressing with slight pressure applied.  D/c Instructions-Education: Discharge instructions given to patient/family with verbalized understanding. D/c education completed with patient/family including follow up instructions, medication list, d/c activities limitations if indicated, with other d/c instructions as indicated by MD - patient able to verbalize understanding, all questions fully answered. Patient instructed to return to ED, call 911, or call MD for any changes in condition.  Patient escorted via WC, and D/C home via private auto.  Cristal Ford, LPN 16/08/9603 5:40 PM

## 2023-09-30 LAB — SURGICAL PATHOLOGY

## 2023-09-30 NOTE — Anesthesia Postprocedure Evaluation (Signed)
Anesthesia Post Note  Patient: Mallory Mcdowell  Procedure(s) Performed: ABDOMINAL MYOMECTOMY (Abdomen)  Patient location during evaluation: Phase II Anesthesia Type: General Level of consciousness: awake Pain management: pain level controlled Vital Signs Assessment: post-procedure vital signs reviewed and stable Respiratory status: spontaneous breathing and respiratory function stable Cardiovascular status: blood pressure returned to baseline and stable Postop Assessment: no headache and no apparent nausea or vomiting Anesthetic complications: no Comments: Late entry   No notable events documented.   Last Vitals:  Vitals:   09/29/23 0207 09/29/23 0550  BP: (!) 142/82 132/86  Pulse: (!) 102 (!) 105  Resp: 18 16  Temp: 37.1 C 37 C  SpO2: 100% 100%    Last Pain:  Vitals:   09/29/23 1431  TempSrc:   PainSc: 4                  Windell Norfolk

## 2023-10-01 ENCOUNTER — Encounter (HOSPITAL_COMMUNITY): Payer: Self-pay | Admitting: Obstetrics & Gynecology

## 2023-10-05 MED ORDER — OXYCODONE-ACETAMINOPHEN 5-325 MG PO TABS: 1.0000 | ORAL_TABLET | ORAL | 0 refills | Status: DC | PRN

## 2023-10-05 MED ORDER — ONDANSETRON 8 MG PO TBDP: 8.0000 mg | ORAL_TABLET | Freq: Three times a day (TID) | ORAL | 0 refills | Status: DC | PRN

## 2023-10-05 MED ORDER — LISINOPRIL 10 MG PO TABS: 10.0000 mg | ORAL_TABLET | Freq: Every day | ORAL | 11 refills | Status: DC

## 2023-10-05 MED ORDER — KETOROLAC TROMETHAMINE 10 MG PO TABS: 10.0000 mg | ORAL_TABLET | Freq: Three times a day (TID) | ORAL | 0 refills | Status: AC | PRN

## 2023-10-05 NOTE — Addendum Note (Signed)
Addended by: Lazaro Arms on: 10/05/2023 04:51 PM   Modules accepted: Orders

## 2023-10-14 ENCOUNTER — Ambulatory Visit: Payer: Medicaid Other | Admitting: Obstetrics & Gynecology

## 2023-10-14 ENCOUNTER — Ambulatory Visit: Payer: Medicaid Other

## 2023-10-14 VITALS — BP 128/87 | HR 110

## 2023-10-14 DIAGNOSIS — Z9889 Other specified postprocedural states: Secondary | ICD-10-CM

## 2023-10-18 ENCOUNTER — Ambulatory Visit: Payer: Medicaid Other

## 2023-10-20 ENCOUNTER — Ambulatory Visit: Payer: Medicaid Other

## 2023-10-20 ENCOUNTER — Ambulatory Visit
Admission: RE | Admit: 2023-10-20 | Discharge: 2023-10-20 | Disposition: A | Discharge status: Home / Self Care | Payer: Medicaid Other | Source: Ambulatory Visit | Attending: Nurse Practitioner | Admitting: Nurse Practitioner

## 2023-10-20 VITALS — BP 113/75 | HR 93 | Temp 98.2°F | Resp 19

## 2023-10-20 DIAGNOSIS — R300 Dysuria: Secondary | ICD-10-CM

## 2023-10-20 LAB — POCT URINALYSIS DIP (MANUAL ENTRY)
Bilirubin, UA: NEGATIVE
Blood, UA: NEGATIVE
Glucose, UA: NEGATIVE mg/dL
Ketones, POC UA: NEGATIVE mg/dL
Leukocytes, UA: NEGATIVE
Nitrite, UA: NEGATIVE
Protein Ur, POC: NEGATIVE mg/dL
Spec Grav, UA: >=1.03 — AB (ref 1.010–1.025)
Urobilinogen, UA: 0.2 U/dL
pH, UA: 5.5 (ref 5.0–8.0)

## 2023-10-20 NOTE — Discharge Instructions (Signed)
The urinalysis was negative for a urinary tract infection. Increase fluids.  Try to drink at least 8-10 8 ounce glasses of water daily. May take over-the-counter Tylenol or ibuprofen as needed for pain, fever, or general discomfort. Make sure you are urinating at least every 2 hours. Avoid caffeine such as tea, soda, and coffee while symptoms persist. If symptoms continue to persist, it is recommended that you follow-up with your primary care physician for further evaluation.  We are providing information for you to establish care with a local physician's office in the area who is excepting patients. Go to the emergency department immediately if you experience fever, chills, abdominal pain, with worsening urinary symptoms. Follow-up as needed.

## 2023-10-20 NOTE — ED Provider Notes (Signed)
[facility]    CSN: [id number] Arrival date & time: 10/20/23  1428      History   Chief Complaint Chief Complaint  Patient presents with   Abdominal Pain    Entered by patient    HPI Mallory Mcdowell is a 37 y.o. female.   The history is provided by the patient.   Patient presents with a 1 week history of burning with urination.  Patient denies fever, chills, chest pain, abdominal pain, nausea, vomiting, diarrhea, vaginal discharge, vaginal odor, or vaginal itching.  Patient reports she is not taking any medication for her symptoms.  Patient with recent myomectomy.  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   Pelvic pain 09/28/2023   Mixed hyperlipidemia 07/30/2022   Fibroids 07/29/2022   Obesity (BMI 30-39.9) 07/29/2022   Essential hypertension 02/12/2022   Diabetes mellitus (HCC) 02/09/2022   Tobacco abuse 02/09/2022    Past Surgical History:  Procedure Laterality Date   MYOMECTOMY N/A 09/28/2023   Procedure: ABDOMINAL MYOMECTOMY;  Surgeon: Lazaro Arms, MD;  Location: [facility];  Service: Gynecology;  Laterality: N/A;   NO PAST SURGERIES      OB History     Gravida  0   Para  0   Term  0   Preterm  0   AB  0   Living  0      SAB  0   IAB  0   Ectopic  0   Multiple  0   Live Births               Home Medications    Prior to Admission medications   Medication Sig Start Date End Date Taking? Authorizing Provider  glipiZIDE (GLUCOTROL) 5 MG tablet TAKE 1 TABLET BY MOUTH TWICE DAILY BEFORE A MEAL 09/29/23   Lazaro Arms, MD  ketorolac (TORADOL) 10 MG tablet Take 1 tablet (10 mg total) by mouth every 8 (eight) hours as  needed. Patient not taking: Reported on 10/14/2023 10/05/23   Lazaro Arms, MD  lisinopril (ZESTRIL) 10 MG tablet Take 1 tablet (10 mg total) by mouth daily. 10/05/23   Lazaro Arms, MD  metFORMIN (GLUCOPHAGE) 1000 MG tablet Take 1,000 mg by mouth 2 (two) times daily with a meal. Patient not taking: Reported on 10/14/2023    [provider]  norethindrone (MICRONOR) 0.35 MG tablet Take 1 tablet (0.35 mg total) by mouth daily. Patient not taking: Reported on 10/14/2023 03/04/23   Lazaro Arms, MD  ondansetron (ZOFRAN-ODT) 8 MG disintegrating tablet Take 1 tablet (8 mg total) by mouth every 8 (eight) hours as needed for nausea or vomiting. Patient not taking: Reported on 10/14/2023 10/05/23   Lazaro Arms, MD  oxyCODONE-acetaminophen (PERCOCET/ROXICET) 5-325 MG tablet Take 1 tablet by mouth every 4 (four) hours as needed for severe pain (pain score 7-10). Patient not taking: Reported on 10/14/2023 10/05/23   Lazaro Arms, MD    Family History Family History  Problem Relation Age of Onset   Hypertension Maternal Grandmother    Diabetes Maternal Grandmother    Asthma Mother    Diabetes Other    Heart failure Other  Cancer Other     Social History Social History   Tobacco Use   Smoking status: Every Day    Current packs/day: 0.50    Types: Cigarettes   Smokeless tobacco: Never  Vaping Use   Vaping status: Never Used  Substance Use Topics   Alcohol use: Yes    Alcohol/week: 1.0 standard drink of alcohol    Types: 1 Cans of beer per week    Comment: sometimes daily, but not always   Drug use: Yes    Types: Marijuana    Comment: last used 09/26/2023 edible on sunday, blunt.     Allergies   Amoxicillin   Review of Systems Review of Systems Per HPI  Physical Exam Triage Vital Signs ED Triage Vitals  Encounter Vitals Group     BP 10/20/23 1512 113/75     Systolic BP Percentile --      Diastolic BP Percentile --      Pulse Rate 10/20/23 1512 93     Resp  10/20/23 1512 19     Temp 10/20/23 1512 98.2 F (36.8 C)     Temp Source 10/20/23 1512 Oral     SpO2 10/20/23 1512 97 %     Weight --      Height --      Head Circumference --      Peak Flow --      Pain Score 10/20/23 1513 5     Pain Loc --      Pain Education --      Exclude from Growth Chart --    No data found.  Updated Vital Signs BP 113/75 (BP Location: Right Arm)   Pulse 93   Temp 98.2 F (36.8 C) (Oral)   Resp 19   LMP 09/20/2023 (Exact Date)   SpO2 97%   Visual Acuity Right Eye Distance:   Left Eye Distance:   Bilateral Distance:    Right Eye Near:   Left Eye Near:    Bilateral Near:     Physical Exam Vitals and nursing note reviewed.  Constitutional:      General: She is not in acute distress.    Appearance: She is well-developed.  HENT:     Head: Normocephalic.  Eyes:     Extraocular Movements: Extraocular movements intact.     Pupils: Pupils are equal, round, and reactive to light.  Cardiovascular:     Rate and Rhythm: Regular rhythm.     Pulses: Normal pulses.     Heart sounds: Normal heart sounds.  Pulmonary:     Effort: Pulmonary effort is normal.     Breath sounds: Normal breath sounds.  Abdominal:     General: Bowel sounds are normal.     Palpations: Abdomen is soft.     Tenderness: There is no abdominal tenderness. There is no right CVA tenderness or left CVA tenderness.  Musculoskeletal:     Cervical back: Normal range of motion.  Lymphadenopathy:     Cervical: No cervical adenopathy.  Skin:    General: Skin is warm and dry.  Neurological:     General: No focal deficit present.     Mental Status: She is alert and oriented to person, place, and time.  Psychiatric:        Mood and Affect: Mood normal.        Behavior: Behavior normal.      UC Treatments / Results  Labs (all labs ordered are listed, but only abnormal results are  displayed) Labs Reviewed  POCT URINALYSIS DIP (MANUAL ENTRY) - Abnormal; Notable for the following  components:      Result Value   Spec Grav, UA >=1.030 (*)    All other components within normal limits    EKG   Radiology No results found.  Procedures Procedures (including critical care time)  Medications Ordered in UC Medications - No data to display  Initial Impression / Assessment and Plan / UC Course  I have reviewed the triage vital signs and the nursing notes.  Pertinent labs & imaging results that were available during my care of the patient were reviewed by me and considered in my medical decision making (see chart for details).  Urinalysis does not indicate a obvious urinary tract infection, urine culture is not indicated.  Patient advised to increase her fluid intake.  Supportive care recommendations were provided and discussed with the patient to include over-the-counter analgesics, voiding every 2 hours, and avoiding caffeine.  Patient was given strict follow-up precautions, along with indications of when to follow-up in the emergency department.  Patient was in agreement with this plan of care and verbalizes understanding.  All questions were answered.  Patient stable for discharge.   Final Clinical Impressions(s) / UC Diagnoses   Final diagnoses:  Dysuria     Discharge Instructions      The urinalysis was negative for a urinary tract infection. Increase fluids.  Try to drink at least 8-10 8 ounce glasses of water daily. May take over-the-counter Tylenol or ibuprofen as needed for pain, fever, or general discomfort. Make sure you are urinating at least every 2 hours. Avoid caffeine such as tea, soda, and coffee while symptoms persist. If symptoms continue to persist, it is recommended that you follow-up with your primary care physician for further evaluation.  We are providing information for you to establish care with a local physician's office in the area who is excepting patients. Go to the emergency department immediately if you experience fever, chills,  abdominal pain, with worsening urinary symptoms. Follow-up as needed.     ED Prescriptions   None    PDMP not reviewed this encounter.   Abran Cantor, NP 10/20/23 1544

## 2023-10-20 NOTE — ED Triage Notes (Signed)
Pt reports abdominal pain x 1 week diarrhea, urinary frequency and  urgency.

## 2023-10-28 MED ORDER — DESOGESTREL-ETHINYL ESTRADIOL 0.15-30 MG-MCG PO TABS: 1.0000 | ORAL_TABLET | Freq: Every day | ORAL | 11 refills | Status: AC

## 2023-10-28 MED ORDER — KETOROLAC TROMETHAMINE 10 MG PO TABS: 10.0000 mg | ORAL_TABLET | Freq: Three times a day (TID) | ORAL | 0 refills | Status: AC | PRN

## 2023-10-28 NOTE — Addendum Note (Signed)
Addended by: Lazaro Arms on: 10/28/2023 12:04 PM   Modules accepted: Orders

## 2023-10-31 ENCOUNTER — Other Ambulatory Visit: Payer: Self-pay | Admitting: Obstetrics & Gynecology

## 2023-11-09 ENCOUNTER — Ambulatory Visit: Payer: Medicaid Other

## 2023-11-11 ENCOUNTER — Ambulatory Visit: Payer: Medicaid Other | Admitting: Obstetrics & Gynecology

## 2023-11-11 ENCOUNTER — Encounter: Payer: Self-pay | Admitting: Obstetrics & Gynecology

## 2023-11-11 VITALS — BP 137/89 | HR 82 | Ht 65.0 in | Wt 171.0 lb

## 2023-11-11 DIAGNOSIS — Z9889 Other specified postprocedural states: Secondary | ICD-10-CM

## 2023-11-11 NOTE — Progress Notes (Signed)
  HPI: Patient returns for routine postoperative follow-up having undergone open myomectomy on 09/28/23.  The patient's immediate postoperative recovery has been unremarkable. Since hospital discharge the patient reports no problems.   Current Outpatient Medications: desogestrel -ethinyl estradiol  (APRI ) 0.15-30 MG-MCG tablet, Take 1 tablet by mouth daily., Disp: 28 tablet, Rfl: 11 glipiZIDE  (GLUCOTROL ) 5 MG tablet, TAKE 1 TABLET BY MOUTH TWICE DAILY BEFORE A MEAL, Disp: 60 tablet, Rfl: 0 lisinopril  (ZESTRIL ) 10 MG tablet, Take 1 tablet (10 mg total) by mouth daily., Disp: 30 tablet, Rfl: 11 metFORMIN  (GLUCOPHAGE ) 1000 MG tablet, Take 1,000 mg by mouth 2 (two) times daily with a meal., Disp: , Rfl:  ketorolac  (TORADOL ) 10 MG tablet, Take 1 tablet (10 mg total) by mouth every 8 (eight) hours as needed. (Patient not taking: Reported on 11/11/2023), Disp: 15 tablet, Rfl: 0 ketorolac  (TORADOL ) 10 MG tablet, Take 1 tablet (10 mg total) by mouth every 8 (eight) hours as needed. (Patient not taking: Reported on 11/11/2023), Disp: 15 tablet, Rfl: 0  No current facility-administered medications for this visit.    Blood pressure 137/89, pulse 82, height 5' 5 (1.651 m), weight 171 lb (77.6 kg).  Physical Exam: Incision clean dry intact Abd soft  Diagnostic Tests:   Pathology: benign  Impression + Management plan:   ICD-10-CM   1. Post-operative state: Myomectomy 09/28/23  Z98.890    normal post op course          Medications Prescribed this encounter: No orders of the defined types were placed in this encounter.     Follow up: No follow-ups on file.    Mallory VEAR Inch, MD Attending Physician for the [facility] 11/11/2023 11:36 AM

## 2023-11-12 ENCOUNTER — Other Ambulatory Visit: Payer: Self-pay

## 2023-11-12 ENCOUNTER — Ambulatory Visit
Admission: RE | Admit: 2023-11-12 | Discharge: 2023-11-12 | Disposition: A | Discharge status: Home / Self Care | Payer: Medicaid Other | Source: Ambulatory Visit | Attending: Nurse Practitioner | Admitting: Nurse Practitioner

## 2023-11-12 VITALS — BP 129/86 | HR 114 | Temp 98.5°F | Resp 20

## 2023-11-12 DIAGNOSIS — Z113 Encounter for screening for infections with a predominantly sexual mode of transmission: Secondary | ICD-10-CM | POA: Diagnosis not present

## 2023-11-12 DIAGNOSIS — N898 Other specified noninflammatory disorders of vagina: Secondary | ICD-10-CM | POA: Insufficient documentation

## 2023-11-12 NOTE — ED Triage Notes (Addendum)
 Pt reports vaginal discharge x1 week. Pt inquiring about STD check, reports last unprotected intercourse on Monday. Reports also recently had fibroids removed.  Vaginal swab obtained post triage.

## 2023-11-12 NOTE — ED Provider Notes (Signed)
 [facility]    CSN: [id number] Arrival date & time: 11/12/23  1856      History   Chief Complaint Chief Complaint  Patient presents with   Vaginal Discharge    Entered by patient    HPI Mallory Mcdowell is a 38 y.o. female.   The history is provided by the patient.   Patient presents for complaints of vaginal discharge.  Patient states symptoms have been present for the past 2 days.  Describes the discharge as white.  Reports that she recently had a myomectomy done, and had anal sex.  She states that she is concerned because her partners penis may have come into contact with her vaginal area.  She states that she has had 1 female partner in the past 90 days.  Denies fever, chills, vaginal itching, vaginal odor, or urinary symptoms.  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   Pelvic pain 09/28/2023   Mixed hyperlipidemia 07/30/2022   Fibroids 07/29/2022   Obesity (BMI 30-39.9) 07/29/2022   Essential hypertension 02/12/2022   Diabetes mellitus (HCC) 02/09/2022   Tobacco abuse 02/09/2022    Past Surgical History:  Procedure Laterality Date   MYOMECTOMY N/A 09/28/2023   Procedure: ABDOMINAL MYOMECTOMY;  Surgeon: Jayne Vonn DEL, MD;  Location: [facility];  Service: Gynecology;  Laterality: N/A;   NO PAST SURGERIES      OB History     Gravida  0   Para  0   Term  0   Preterm  0   AB  0   Living  0      SAB  0   IAB  0   Ectopic  0   Multiple  0   Live Births               Home Medications    Prior to Admission medications   Medication Sig Start Date End Date Taking? Authorizing Provider  desogestrel -ethinyl estradiol  (APRI )  0.15-30 MG-MCG tablet Take 1 tablet by mouth daily. 10/28/23   Jayne Vonn DEL, MD  glipiZIDE  (GLUCOTROL ) 5 MG tablet TAKE 1 TABLET BY MOUTH TWICE DAILY BEFORE A MEAL 11/05/23   Jayne Vonn DEL, MD  ketorolac  (TORADOL ) 10 MG tablet Take 1 tablet (10 mg total) by mouth every 8 (eight) hours as needed. Patient not taking: Reported on 11/11/2023 10/05/23   Jayne Vonn DEL, MD  ketorolac  (TORADOL ) 10 MG tablet Take 1 tablet (10 mg total) by mouth every 8 (eight) hours as needed. Patient not taking: Reported on 11/11/2023 10/28/23   Jayne Vonn DEL, MD  lisinopril  (ZESTRIL ) 10 MG tablet Take 1 tablet (10 mg total) by mouth daily. 10/05/23   Jayne Vonn DEL, MD  metFORMIN  (GLUCOPHAGE ) 1000 MG tablet Take 1,000 mg by mouth 2 (two) times daily with a meal.    [provider]    Family History Family History  Problem Relation Age of Onset   Hypertension Maternal Grandmother    Diabetes Maternal Grandmother    Asthma Mother    Diabetes Other    Heart failure Other    Cancer Other     Social History Social History   Tobacco Use  Smoking status: Every Day    Current packs/day: 0.50    Types: Cigarettes   Smokeless tobacco: Never  Vaping Use   Vaping status: Never Used  Substance Use Topics   Alcohol use: Yes    Alcohol/week: 1.0 standard drink of alcohol    Types: 1 Cans of beer per week    Comment: sometimes daily, but not always   Drug use: Yes    Types: Marijuana    Comment: last used 09/26/2023 edible on sunday, blunt.     Allergies   Amoxicillin   Review of Systems Review of Systems Per HPI  Physical Exam Triage Vital Signs ED Triage Vitals  Encounter Vitals Group     BP 11/12/23 1936 129/86     Systolic BP Percentile --      Diastolic BP Percentile --      Pulse Rate 11/12/23 1936 (!) 114     Resp 11/12/23 1936 20     Temp 11/12/23 1936 98.5 F (36.9 C)     Temp Source 11/12/23 1936 Oral     SpO2 11/12/23 1936 97 %     Weight --      Height --      Head  Circumference --      Peak Flow --      Pain Score 11/12/23 1935 0     Pain Loc --      Pain Education --      Exclude from Growth Chart --    No data found.  Updated Vital Signs BP 129/86 (BP Location: Right Arm)   Pulse (!) 114   Temp 98.5 F (36.9 C) (Oral)   Resp 20   LMP 10/25/2023 (Approximate)   SpO2 97%   Visual Acuity Right Eye Distance:   Left Eye Distance:   Bilateral Distance:    Right Eye Near:   Left Eye Near:    Bilateral Near:     Physical Exam Vitals and nursing note reviewed.  Constitutional:      General: She is not in acute distress.    Appearance: Normal appearance.  HENT:     Head: Normocephalic.  Eyes:     Extraocular Movements: Extraocular movements intact.     Pupils: Pupils are equal, round, and reactive to light.  Pulmonary:     Effort: Pulmonary effort is normal.  Musculoskeletal:     Cervical back: Normal range of motion.  Skin:    General: Skin is warm and dry.  Neurological:     General: No focal deficit present.     Mental Status: She is alert and oriented to person, place, and time.  Psychiatric:        Mood and Affect: Mood normal.        Behavior: Behavior normal.      UC Treatments / Results  Labs (all labs ordered are listed, but only abnormal results are displayed) Labs Reviewed  CERVICOVAGINAL ANCILLARY ONLY    EKG   Radiology No results found.  Procedures Procedures (including critical care time)  Medications Ordered in UC Medications - No data to display  Initial Impression / Assessment and Plan / UC Course  I have reviewed the triage vital signs and the nursing notes.  Pertinent labs & imaging results that were available during my care of the patient were reviewed by me and considered in my medical decision making (see chart for details).  Cytology swab is pending.  Patient was advised she will be contacted if  the pending test results are abnormal.  Supportive care recommendations were provided and  discussed with the patient to include increasing condom use, refraining from sexual intercourse until test results are received, and notifying all partners if her test results are positive.  Patient verbalizes understanding.  All questions were answered.  Patient stable for discharge.   Final Clinical Impressions(s) / UC Diagnoses   Final diagnoses:  Vaginal discharge  Screening examination for sexually transmitted disease     Discharge Instructions      Your results should be available within the next 48 to 72 hours.  If you have access to MyChart, you will be able to see the results there.  If your results are positive, you will be contacted to discuss treatment. Refrain from sexual intercourse until test results have been received.  If results are positive, please notify all partners.  If treatment is required, you should refrain from sexual intercourse for and additional 7 days after completing treatment. Increase condom use Follow-up as needed.     ED Prescriptions   None    PDMP not reviewed this encounter.   Gilmer Etta PARAS, NP 11/12/23 2003

## 2023-11-12 NOTE — Discharge Instructions (Signed)
 Your results should be available within the next 48 to 72 hours.  If you have access to MyChart, you will be able to see the results there.  If your results are positive, you will be contacted to discuss treatment. Refrain from sexual intercourse until test results have been received.  If results are positive, please notify all partners.  If treatment is required, you should refrain from sexual intercourse for and additional 7 days after completing treatment. Increase condom use Follow-up as needed.

## 2023-11-15 LAB — CERVICOVAGINAL ANCILLARY ONLY
Bacterial Vaginitis (gardnerella): POSITIVE — AB
Candida Glabrata: NEGATIVE
Candida Vaginitis: POSITIVE — AB
Chlamydia: NEGATIVE
Comment: NEGATIVE
Comment: NEGATIVE
Comment: NEGATIVE
Comment: NEGATIVE
Comment: NEGATIVE
Comment: NORMAL
Neisseria Gonorrhea: NEGATIVE
Trichomonas: NEGATIVE

## 2023-11-16 ENCOUNTER — Telehealth (HOSPITAL_COMMUNITY): Payer: Self-pay

## 2023-11-16 MED ORDER — FLUCONAZOLE 150 MG PO TABS: 150.0000 mg | ORAL_TABLET | Freq: Every day | ORAL | 0 refills | Status: AC

## 2023-11-16 MED ORDER — METRONIDAZOLE 500 MG PO TABS: 500.0000 mg | ORAL_TABLET | Freq: Two times a day (BID) | ORAL | 0 refills | Status: AC

## 2023-11-16 NOTE — Telephone Encounter (Signed)
 See result note.

## 2023-12-09 ENCOUNTER — Other Ambulatory Visit: Payer: Self-pay | Admitting: Obstetrics & Gynecology

## 2023-12-12 ENCOUNTER — Ambulatory Visit
Admission: EM | Admit: 2023-12-12 | Discharge: 2023-12-12 | Disposition: A | Discharge status: Home / Self Care | Payer: 59 | Attending: Nurse Practitioner | Admitting: Nurse Practitioner

## 2023-12-12 DIAGNOSIS — Z113 Encounter for screening for infections with a predominantly sexual mode of transmission: Secondary | ICD-10-CM | POA: Insufficient documentation

## 2023-12-12 DIAGNOSIS — N898 Other specified noninflammatory disorders of vagina: Secondary | ICD-10-CM | POA: Insufficient documentation

## 2023-12-12 NOTE — ED Triage Notes (Signed)
Pt reports needs STD testing testing for a yeast infection or bacterial infection, started noticing an abnormal amount of discharge yesterday, denies any odor or color to the discharge x 3 days states she has recently had fibroid surgery and wants to know if the two could be related.

## 2023-12-12 NOTE — ED Provider Notes (Signed)
[facility]    CSN: [id number] Arrival date & time: 12/12/23  1231      History   Chief Complaint No chief complaint on file.   HPI Mallory Mcdowell is a 38 y.o. female.   The history is provided by the patient.   Patient presents for complaints of vaginal discharge. Patient states symptoms have been present for the past day. Describes the discharge as "white and thick." She states that she has had 1 female partner in the past 90 days. Denies fever, chills, vaginal itching, vaginal odor, or urinary symptoms.  Patient is also requesting HIV and RPR testing.  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   Pelvic pain 09/28/2023   Mixed hyperlipidemia 07/30/2022   Fibroids 07/29/2022   Obesity (BMI 30-39.9) 07/29/2022   Essential hypertension 02/12/2022   Diabetes mellitus (HCC) 02/09/2022   Tobacco abuse 02/09/2022    Past Surgical History:  Procedure Laterality Date   MYOMECTOMY N/A 09/28/2023   Procedure: ABDOMINAL MYOMECTOMY;  Surgeon: Lazaro Arms, MD;  Location: [facility];  Service: Gynecology;  Laterality: N/A;   NO PAST SURGERIES      OB History     Gravida  0   Para  0   Term  0   Preterm  0   AB  0   Living  0      SAB  0   IAB  0   Ectopic  0   Multiple  0   Live Births               Home Medications    Prior to Admission medications   Medication Sig Start Date End Date Taking? Authorizing Provider  desogestrel-ethinyl estradiol (APRI) 0.15-30 MG-MCG tablet Take 1 tablet by mouth daily. 10/28/23   Lazaro Arms, MD  glipiZIDE (GLUCOTROL) 5 MG tablet TAKE 1 TABLET BY MOUTH TWICE DAILY BEFORE A MEAL 11/05/23   Lazaro Arms, MD  ketorolac (TORADOL) 10 MG tablet Take 1 tablet (10 mg total) by mouth every 8 (eight) hours as needed. Patient not taking: Reported on 11/11/2023 10/05/23   Lazaro Arms, MD  ketorolac (TORADOL) 10 MG tablet Take 1 tablet (10 mg total) by mouth every 8 (eight) hours as needed. Patient not taking: Reported on 11/11/2023 10/28/23   Lazaro Arms, MD  lisinopril (ZESTRIL) 10 MG tablet Take 1 tablet (10 mg total) by mouth daily. 10/05/23   Lazaro Arms, MD  metFORMIN (GLUCOPHAGE) 1000 MG tablet Take 1,000 mg by mouth 2 (two) times daily with a meal.    [provider]  metroNIDAZOLE (FLAGYL) 500 MG tablet Take 1 tablet (500 mg total) by mouth 2 (two) times daily. 11/16/23   LampteyBritta Mccreedy, MD    Family History Family History  Problem Relation Age of Onset   Hypertension Maternal Grandmother    Diabetes Maternal Grandmother    Asthma Mother    Diabetes Other    Heart failure Other    Cancer Other     Social History Social History   Tobacco Use   Smoking status: Every Day    Current packs/day: 0.50    Types:  Cigarettes   Smokeless tobacco: Never  Vaping Use   Vaping status: Never Used  Substance Use Topics   Alcohol use: Yes    Alcohol/week: 1.0 standard drink of alcohol    Types: 1 Cans of beer per week    Comment: sometimes daily, but not always   Drug use: Yes    Types: Marijuana    Comment: last used 09/26/2023 edible on sunday, blunt.     Allergies   Amoxicillin   Review of Systems Review of Systems Per HPI  Physical Exam Triage Vital Signs ED Triage Vitals  Encounter Vitals Group     BP 12/12/23 1406 (!) 153/88     Systolic BP Percentile --      Diastolic BP Percentile --      Pulse Rate 12/12/23 1406 (!) 119     Resp 12/12/23 1406 18     Temp 12/12/23 1406 98.5 F (36.9 C)     Temp Source 12/12/23 1406 Oral     SpO2 12/12/23 1406 97 %     Weight --      Height --      Head Circumference --      Peak Flow --      Pain Score  12/12/23 1411 0     Pain Loc --      Pain Education --      Exclude from Growth Chart --    No data found.  Updated Vital Signs BP (!) 153/88 (BP Location: Right Arm)   Pulse (!) 119   Temp 98.5 F (36.9 C) (Oral)   Resp 18   LMP 10/25/2023 (Approximate)   SpO2 97%   Visual Acuity Right Eye Distance:   Left Eye Distance:   Bilateral Distance:    Right Eye Near:   Left Eye Near:    Bilateral Near:     Physical Exam Vitals and nursing note reviewed.  Constitutional:      General: She is not in acute distress.    Appearance: Normal appearance.  HENT:     Head: Normocephalic.  Eyes:     Extraocular Movements: Extraocular movements intact.     Pupils: Pupils are equal, round, and reactive to light.  Cardiovascular:     Rate and Rhythm: Tachycardia present.     Pulses: Normal pulses.     Heart sounds: Normal heart sounds.  Pulmonary:     Effort: Pulmonary effort is normal.     Breath sounds: Normal breath sounds.  Abdominal:     General: Bowel sounds are normal.     Palpations: Abdomen is soft.  Genitourinary:    Comments: GU exam deferred, self swab performed  Musculoskeletal:     Cervical back: Normal range of motion.  Skin:    General: Skin is warm and dry.  Neurological:     General: No focal deficit present.     Mental Status: She is alert and oriented to person, place, and time.  Psychiatric:        Mood and Affect: Mood normal.        Behavior: Behavior normal.      UC Treatments / Results  Labs (all labs ordered are listed, but only abnormal results are displayed) Labs Reviewed  RPR  HIV ANTIBODY (ROUTINE TESTING W REFLEX)  CERVICOVAGINAL ANCILLARY ONLY    EKG   Radiology No results found.  Procedures Procedures (including critical care time)  Medications Ordered in UC Medications - No data to display  Initial  Impression / Assessment and Plan / UC Course  I have reviewed the triage vital signs and the nursing notes.  Pertinent  labs & imaging results that were available during my care of the patient were reviewed by me and considered in my medical decision making (see chart for details).  Cytology swab is pending. Patient was advised she will be contacted if the pending test results are abnormal. Supportive care recommendations were provided and discussed with the patient to include increasing condom use, refraining from sexual intercourse until test results are received, and notifying all partners if her test results are positive. Patient verbalizes understanding. All questions were answered. Patient stable for discharge.    Final Clinical Impressions(s) / UC Diagnoses   Final diagnoses:  Vaginal discharge  Screening examination for sexually transmitted disease     Discharge Instructions      Your results should be available within the next 48 to 72 hours.  If you have access to MyChart, you will be able to see the results there.  If your results are positive, you will be contacted to discuss treatment. Refrain from sexual intercourse until test results have been received.  If results are positive, please notify all partners.  If treatment is required, you should refrain from sexual intercourse for and additional 7 days after completing treatment. Increase condom use Follow-up as needed.        ED Prescriptions   None    PDMP not reviewed this encounter.   Abran Cantor, NP 12/12/23 1444

## 2023-12-12 NOTE — Discharge Instructions (Addendum)
Your results should be available within the next 48 to 72 hours.  If you have access to MyChart, you will be able to see the results there.  If your results are positive, you will be contacted to discuss treatment. Refrain from sexual intercourse until test results have been received.  If results are positive, please notify all partners.  If treatment is required, you should refrain from sexual intercourse for and additional 7 days after completing treatment. Increase condom use Follow-up as needed.

## 2023-12-13 ENCOUNTER — Telehealth: Payer: Self-pay

## 2023-12-13 ENCOUNTER — Telehealth (HOSPITAL_COMMUNITY): Payer: Self-pay

## 2023-12-13 LAB — CERVICOVAGINAL ANCILLARY ONLY
Bacterial Vaginitis (gardnerella): POSITIVE — AB
Candida Glabrata: NEGATIVE
Candida Vaginitis: NEGATIVE
Chlamydia: NEGATIVE
Comment: NEGATIVE
Comment: NEGATIVE
Comment: NEGATIVE
Comment: NEGATIVE
Comment: NEGATIVE
Comment: NORMAL
Neisseria Gonorrhea: NEGATIVE
Trichomonas: NEGATIVE

## 2023-12-13 MED ORDER — METRONIDAZOLE 500 MG PO TABS
500.0000 mg | ORAL_TABLET | Freq: Two times a day (BID) | ORAL | 0 refills | Status: AC
Start: 2023-12-13 — End: 2023-12-20

## 2023-12-13 NOTE — Telephone Encounter (Signed)
Pt called with questions in regard to results of cytology swab, pt asked if a man could have BV and if not hoe she continues to get BV after having intercourse. States she has also had anal followed by vaginal sex, Per the provider I explained that after intercourse it is recommended to void urine to push out sperm that may cause an increase of bad bacteria in the vagina, provider also recommended not to have vaginal sex after having anal sex. Provider recommended, purchasing boric Acid suppositories to help decrease the likelihood of BV returning. Instructed to place suppositories directly into the vagina. Pt verbalized understanding.

## 2023-12-13 NOTE — Telephone Encounter (Signed)
 Per protocol, pt requires tx with metronidazole. Rx sent to pharmacy on file.

## 2023-12-14 LAB — HIV ANTIBODY (ROUTINE TESTING W REFLEX): HIV Screen 4th Generation wRfx: NONREACTIVE

## 2023-12-14 LAB — RPR: RPR Ser Ql: NONREACTIVE

## 2023-12-16 ENCOUNTER — Ambulatory Visit
Admission: EM | Admit: 2023-12-16 | Discharge: 2023-12-16 | Disposition: A | Discharge status: Home / Self Care | Payer: 59 | Attending: Nurse Practitioner | Admitting: Nurse Practitioner

## 2023-12-16 DIAGNOSIS — J101 Influenza due to other identified influenza virus with other respiratory manifestations: Secondary | ICD-10-CM | POA: Diagnosis not present

## 2023-12-16 LAB — POC COVID19/FLU A&B COMBO
Covid Antigen, POC: NEGATIVE
Influenza A Antigen, POC: POSITIVE — AB
Influenza B Antigen, POC: NEGATIVE

## 2023-12-16 MED ORDER — PROMETHAZINE-DM 6.25-15 MG/5ML PO SYRP: 5.0000 mL | ORAL_SOLUTION | Freq: Four times a day (QID) | ORAL | 0 refills | Status: AC | PRN

## 2023-12-16 MED ORDER — OSELTAMIVIR PHOSPHATE 75 MG PO CAPS: 75.0000 mg | ORAL_CAPSULE | Freq: Two times a day (BID) | ORAL | 0 refills | Status: AC

## 2023-12-16 NOTE — Discharge Instructions (Signed)
 You have tested positive for influenza A. Take medication as prescribed. Increase fluids and allow for plenty of rest. Recommend over-the-counter Tylenol  or ibuprofen  as needed for pain, fever, or general discomfort. Recommend increasing your fluid intake.  Try to drink at least 8-10 8 ounce glasses of water daily while symptoms persist.  Also recommend the use of Pedialyte or Gatorade to prevent dehydration. For the cough, recommend using a humidifier in your bedroom at nighttime during sleep and sleeping elevated on pillows while cough symptoms persist. You should remain home until you have been fever free for 24 hours with no medication. Symptoms should improve over the next 5 to 7 days.  If symptoms fail to improve, or if they worsen, you may follow-up in this clinic or with your primary care physician for further evaluation. Follow-up as needed.

## 2023-12-16 NOTE — ED Provider Notes (Signed)
 [facility]    CSN: [id number] Arrival date & time: 12/16/23  1741      History   Chief Complaint No chief complaint on file.   HPI Mallory Mcdowell is a 38 y.o. female.   The history is provided by the patient.   Presents for complaints of sweats, chest congestion, cough, and nausea and vomiting that started over the past 24 hours.  Patient reports 1 episode of nausea and vomiting since symptoms started.  Denies fever, chills, headache, ear pain, wheezing, difficulty breathing, chest pain, abdominal pain, diarrhea, or rash.  Patient reports that she did take Robitussin and DayQuil for her symptoms.  Patient denies any obvious known sick contacts.  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   Pelvic pain 09/28/2023   Mixed hyperlipidemia 07/30/2022   Fibroids 07/29/2022   Obesity (BMI 30-39.9) 07/29/2022   Essential hypertension 02/12/2022   Diabetes mellitus (HCC) 02/09/2022   Tobacco abuse 02/09/2022    Past Surgical History:  Procedure Laterality Date   MYOMECTOMY N/A 09/28/2023   Procedure: ABDOMINAL MYOMECTOMY;  Surgeon: Jayne Vonn DEL, MD;  Location: [facility];  Service: Gynecology;  Laterality: N/A;   NO PAST SURGERIES      OB History     Gravida  0   Para  0   Term  0   Preterm  0   AB  0   Living  0      SAB  0   IAB  0   Ectopic  0   Multiple  0   Live Births               Home Medications    Prior to Admission medications   Medication Sig Start Date End Date Taking? Authorizing Provider  oseltamivir  (TAMIFLU ) 75 MG capsule Take 1 capsule (75 mg total) by mouth every 12 (twelve) hours. 12/16/23  Yes Leath-Warren,  Etta PARAS, NP  promethazine -dextromethorphan (PROMETHAZINE -DM) 6.25-15 MG/5ML syrup Take 5 mLs by mouth 4 (four) times daily as needed. 12/16/23  Yes Leath-Warren, Etta PARAS, NP  desogestrel -ethinyl estradiol  (APRI ) 0.15-30 MG-MCG tablet Take 1 tablet by mouth daily. 10/28/23   Jayne Vonn DEL, MD  glipiZIDE  (GLUCOTROL ) 5 MG tablet TAKE 1 TABLET BY MOUTH TWICE DAILY BEFORE A MEAL 12/13/23   Jayne Vonn DEL, MD  ketorolac  (TORADOL ) 10 MG tablet Take 1 tablet (10 mg total) by mouth every 8 (eight) hours as needed. Patient not taking: Reported on 11/11/2023 10/05/23   Jayne Vonn DEL, MD  ketorolac  (TORADOL ) 10 MG tablet Take 1 tablet (10 mg total) by mouth every 8 (eight) hours as needed. Patient not taking: Reported on 11/11/2023 10/28/23   Jayne Vonn DEL, MD  lisinopril  (ZESTRIL ) 10 MG tablet Take 1 tablet (10 mg total) by mouth daily. 10/05/23   Jayne Vonn DEL, MD  metFORMIN  (GLUCOPHAGE ) 1000 MG tablet Take 1,000 mg by mouth 2 (two) times daily with a meal.    [provider]  metroNIDAZOLE  (FLAGYL ) 500 MG tablet Take 1 tablet (500 mg total) by mouth 2 (two) times daily. 11/16/23   Blaise Aleene KIDD, MD  metroNIDAZOLE  (FLAGYL ) 500 MG tablet Take 1 tablet (500 mg total)  by mouth 2 (two) times daily for 7 days. 12/13/23 12/20/23  Vonna Sharlet POUR, MD    Family History Family History  Problem Relation Age of Onset   Hypertension Maternal Grandmother    Diabetes Maternal Grandmother    Asthma Mother    Diabetes Other    Heart failure Other    Cancer Other     Social History Social History   Tobacco Use   Smoking status: Every Day    Current packs/day: 0.50    Types: Cigarettes   Smokeless tobacco: Never  Vaping Use   Vaping status: Never Used  Substance Use Topics   Alcohol use: Yes    Alcohol/week: 1.0 standard drink of alcohol    Types: 1 Cans of beer per week    Comment: sometimes daily, but not always   Drug use: Yes    Types: Marijuana    Comment: last used 09/26/2023 edible  on sunday, blunt.     Allergies   Amoxicillin   Review of Systems Review of Systems Per HPI  Physical Exam Triage Vital Signs ED Triage Vitals  Encounter Vitals Group     BP 12/16/23 1844 (!) 162/80     Systolic BP Percentile --      Diastolic BP Percentile --      Pulse Rate 12/16/23 1844 (!) 114     Resp 12/16/23 1844 18     Temp 12/16/23 1844 100.2 F (37.9 C)     Temp Source 12/16/23 1844 Oral     SpO2 12/16/23 1844 94 %     Weight --      Height --      Head Circumference --      Peak Flow --      Pain Score 12/16/23 1845 6     Pain Loc --      Pain Education --      Exclude from Growth Chart --    No data found.  Updated Vital Signs BP (!) 162/80 (BP Location: Right Arm)   Pulse (!) 114   Temp 100.2 F (37.9 C) (Oral)   Resp 18   LMP 11/26/2023 (Approximate)   SpO2 94%   Visual Acuity Right Eye Distance:   Left Eye Distance:   Bilateral Distance:    Right Eye Near:   Left Eye Near:    Bilateral Near:     Physical Exam Vitals and nursing note reviewed.  Constitutional:      General: She is not in acute distress.    Appearance: Normal appearance.  HENT:     Head: Normocephalic.     Right Ear: Tympanic membrane, ear canal and external ear normal.     Left Ear: Tympanic membrane, ear canal and external ear normal.     Nose: Congestion present.     Mouth/Throat:     Mouth: Mucous membranes are moist.  Eyes:     Extraocular Movements: Extraocular movements intact.     Conjunctiva/sclera: Conjunctivae normal.     Pupils: Pupils are equal, round, and reactive to light.  Cardiovascular:     Rate and Rhythm: Regular rhythm. Tachycardia present.     Pulses: Normal pulses.     Heart sounds: Normal heart sounds.  Pulmonary:     Effort: Pulmonary effort is normal. No respiratory distress.     Breath sounds: Normal breath sounds. No stridor. No wheezing, rhonchi or rales.  Abdominal:     General: Bowel sounds are normal.  Palpations: Abdomen is  soft.     Tenderness: There is no abdominal tenderness.  Musculoskeletal:     Cervical back: Normal range of motion.  Lymphadenopathy:     Cervical: No cervical adenopathy.  Skin:    General: Skin is warm and dry.  Neurological:     General: No focal deficit present.     Mental Status: She is alert and oriented to person, place, and time.  Psychiatric:        Mood and Affect: Mood normal.        Behavior: Behavior normal.   He was at   UC Treatments / Results  Labs (all labs ordered are listed, but only abnormal results are displayed) Labs Reviewed  POC COVID19/FLU A&B COMBO - Abnormal; Notable for the following components:      Result Value   Influenza A Antigen, POC Positive (*)    All other components within normal limits    EKG   Radiology No results found.  Procedures Procedures (including critical care time)  Medications Ordered in UC Medications - No data to display  Initial Impression / Assessment and Plan / UC Course  I have reviewed the triage vital signs and the nursing notes.  Pertinent labs & imaging results that were available during my care of the patient were reviewed by me and considered in my medical decision making (see chart for details).  COVID/flu test was positive for influenza A.  Tamiflu  75 mg twice daily for the next 5 days prescribed.  For cough, Promethazine  DM was prescribed.  Supportive care recommendations were provided and discussed with the patient to include fluids, rest, and over-the-counter analgesics.  Discussed indications with patient regarding follow-up.  Patient was in agreement with this plan of care and verbalizes understanding.  All questions were answered.  Patient stable for discharge.  Work note was provided.  Final Clinical Impressions(s) / UC Diagnoses   Final diagnoses:  Influenza A     Discharge Instructions      You have tested positive for influenza A. Take medication as prescribed. Increase fluids and  allow for plenty of rest. Recommend over-the-counter Tylenol  or ibuprofen  as needed for pain, fever, or general discomfort. Recommend increasing your fluid intake.  Try to drink at least 8-10 8 ounce glasses of water daily while symptoms persist.  Also recommend the use of Pedialyte or Gatorade to prevent dehydration. For the cough, recommend using a humidifier in your bedroom at nighttime during sleep and sleeping elevated on pillows while cough symptoms persist. You should remain home until you have been fever free for 24 hours with no medication. Symptoms should improve over the next 5 to 7 days.  If symptoms fail to improve, or if they worsen, you may follow-up in this clinic or with your primary care physician for further evaluation. Follow-up as needed.     ED Prescriptions     Medication Sig Dispense Auth. Provider   oseltamivir  (TAMIFLU ) 75 MG capsule Take 1 capsule (75 mg total) by mouth every 12 (twelve) hours. 10 capsule Leath-Warren, Etta PARAS, NP   promethazine -dextromethorphan (PROMETHAZINE -DM) 6.25-15 MG/5ML syrup Take 5 mLs by mouth 4 (four) times daily as needed. 118 mL Leath-Warren, Etta PARAS, NP      PDMP not reviewed this encounter.   Gilmer Etta PARAS, NP 12/16/23 1904

## 2023-12-16 NOTE — ED Triage Notes (Signed)
 Pt reports she is having sweats, n/v, chest congestion, and cough x 1 day

## 2024-01-17 ENCOUNTER — Telehealth: Payer: Self-pay

## 2024-01-17 NOTE — Telephone Encounter (Signed)
 Called patient back and heard voicemail that mailbox is full.

## 2024-01-17 NOTE — Telephone Encounter (Signed)
 Patient called and stated that she would like to speak with Dr. Despina Hidden; she had surgery but, she is still having some bleeding.

## 2024-01-19 MED ORDER — MEGESTROL ACETATE 40 MG PO TABS: ORAL_TABLET | ORAL | 3 refills | Status: AC

## 2024-01-19 NOTE — Addendum Note (Signed)
 Addended by: Lazaro Arms on: 01/19/2024 07:24 AM   Modules accepted: Orders

## 2024-02-08 ENCOUNTER — Telehealth: Payer: Self-pay

## 2024-02-08 NOTE — Telephone Encounter (Signed)
 Patient was identified as falling into the [place] Measure - Diabetes.   Patient was: Patient is not currently using our practice.  Dismissed June 2024.

## 2024-02-29 ENCOUNTER — Ambulatory Visit

## 2024-02-29 DIAGNOSIS — R300 Dysuria: Secondary | ICD-10-CM | POA: Diagnosis not present

## 2024-02-29 DIAGNOSIS — Z202 Contact with and (suspected) exposure to infections with a predominantly sexual mode of transmission: Secondary | ICD-10-CM | POA: Diagnosis not present

## 2024-02-29 DIAGNOSIS — N760 Acute vaginitis: Secondary | ICD-10-CM | POA: Diagnosis not present

## 2024-03-27 ENCOUNTER — Other Ambulatory Visit: Payer: Self-pay | Admitting: Obstetrics & Gynecology

## 2024-05-15 DIAGNOSIS — N898 Other specified noninflammatory disorders of vagina: Secondary | ICD-10-CM | POA: Diagnosis not present

## 2024-07-19 DIAGNOSIS — Z3202 Encounter for pregnancy test, result negative: Secondary | ICD-10-CM | POA: Diagnosis not present

## 2024-07-20 ENCOUNTER — Encounter: Payer: Self-pay | Admitting: Obstetrics & Gynecology

## 2024-07-24 ENCOUNTER — Ambulatory Visit: Admitting: Adult Health

## 2024-07-25 ENCOUNTER — Encounter: Payer: Self-pay | Admitting: Obstetrics & Gynecology

## 2024-07-25 ENCOUNTER — Ambulatory Visit (INDEPENDENT_AMBULATORY_CARE_PROVIDER_SITE_OTHER): Admitting: Obstetrics & Gynecology

## 2024-07-25 VITALS — BP 147/84 | HR 91 | Ht 65.0 in | Wt 170.0 lb

## 2024-07-25 DIAGNOSIS — N938 Other specified abnormal uterine and vaginal bleeding: Secondary | ICD-10-CM

## 2024-07-25 MED ORDER — POLYETHYLENE GLYCOL 3350 17 GM/SCOOP PO POWD: 17.0000 g | Freq: Every day | ORAL | 0 refills | Status: AC

## 2024-07-25 NOTE — Progress Notes (Signed)
 Follow up appointment  DUB  Chief Complaint  Patient presents with   2 periods this month    [facility] last week-neg pregnancy, neg vaginal screening    Blood pressure (!) 147/84, pulse 91, height 5' 5 (1.651 m), weight 170 lb (77.1 kg), last menstrual period 07/02/2024.  Brown spotting for a few days Was seen at Urgent care, swabs neg UPT neg No pain No heavy bleeding Periods much better  Will see in Decewmber and do sonogram to evaluate the uterus  MEDS ordered this encounter: Meds ordered this encounter  Medications   polyethylene glycol powder (MIRALAX ) 17 GM/SCOOP powder    Sig: Take 17 g by mouth daily. Dissolve 1 capful (17g) in 4-8 ounces of liquid and take by mouth daily.    Dispense:  238 g    Refill:  0    Orders for this encounter: No orders of the defined types were placed in this encounter.   Impression + Management Plan   ICD-10-CM   1. DUB (dysfunctional uterine bleeding)  N93.8       Follow Up: Return for see Dr Jayne December, schedule GYN sonogram in house and see Dr Jayne same day.     All questions were answered.  Past Medical History:  Diagnosis Date   Diabetes mellitus without complication (HCC)    Hypertension     Past Surgical History:  Procedure Laterality Date   MYOMECTOMY N/A 09/28/2023   Procedure: ABDOMINAL MYOMECTOMY;  Surgeon: Jayne Vonn DEL, MD;  Location: [facility];  Service: Gynecology;  Laterality: N/A;   NO PAST SURGERIES      OB History     Gravida  0   Para  0   Term  0   Preterm  0   AB  0   Living  0      SAB  0   IAB  0   Ectopic  0   Multiple  0   Live Births              Allergies  Allergen Reactions   Amoxicillin Swelling and Anaphylaxis    Social History   Socioeconomic History   Marital status: Single    Spouse name: Not on file   Number of children: Not on file   Years of education: Not on file   Highest education level: Not on file  Occupational History   Not on file   Tobacco Use   Smoking status: Every Day    Current packs/day: 0.50    Types: Cigarettes   Smokeless tobacco: Never  Vaping Use   Vaping status: Never Used  Substance and Sexual Activity   Alcohol use: Yes    Alcohol/week: 1.0 standard drink of alcohol    Types: 1 Cans of beer per week    Comment: sometimes daily, but not always   Drug use: Yes    Types: Marijuana    Comment: last used 09/26/2023 edible on sunday, blunt.   Sexual activity: Yes    Birth control/protection: None  Other Topics Concern   Not on file  Social History Narrative   Not on file   Social Drivers of Health   Financial Resource Strain: Low Risk  (12/31/2022)   Overall Financial Resource Strain (CARDIA)    Difficulty of Paying Living Expenses: Not hard at all  Food Insecurity: No Food Insecurity (09/28/2023)   Hunger Vital Sign    Worried About Running Out of Food in the Last Year: Never  true    Ran Out of Food in the Last Year: Never true  Transportation Needs: No Transportation Needs (09/28/2023)   PRAPARE - Administrator, Civil Service (Medical): No    Lack of Transportation (Non-Medical): No  Physical Activity: Insufficiently Active (12/31/2022)   Exercise Vital Sign    Days of Exercise per Week: 5 days    Minutes of Exercise per Session: 10 min  Stress: Stress Concern Present (12/31/2022)   [employer] of Occupational Health - Occupational Stress Questionnaire    Feeling of Stress : Rather much  Social Connections: Moderately Integrated (12/31/2022)   Social Connection and Isolation Panel    Frequency of Communication with Friends and Family: More than three times a week    Frequency of Social Gatherings with Friends and Family: Once a week    Attends Religious Services: 1 to 4 times per year    Active Member of [employer] or Organizations: Yes    Attends Banker Meetings: 1 to 4 times per year    Marital Status: Never married    Family History  Problem Relation Age  of Onset   Hypertension Maternal Grandmother    Diabetes Maternal Grandmother    Asthma Mother    Diabetes Other    Heart failure Other    Cancer Other

## 2024-08-12 DIAGNOSIS — N898 Other specified noninflammatory disorders of vagina: Secondary | ICD-10-CM | POA: Diagnosis not present

## 2024-08-12 DIAGNOSIS — N921 Excessive and frequent menstruation with irregular cycle: Secondary | ICD-10-CM | POA: Diagnosis not present

## 2024-08-14 ENCOUNTER — Encounter: Payer: Self-pay | Admitting: Obstetrics & Gynecology

## 2024-08-15 DIAGNOSIS — D251 Intramural leiomyoma of uterus: Secondary | ICD-10-CM | POA: Diagnosis not present

## 2024-08-15 DIAGNOSIS — D252 Subserosal leiomyoma of uterus: Secondary | ICD-10-CM | POA: Diagnosis not present

## 2024-08-15 MED ORDER — NORETHINDRONE ACETATE 5 MG PO TABS: 10.0000 mg | ORAL_TABLET | Freq: Every day | ORAL | 11 refills | Status: AC

## 2024-08-18 ENCOUNTER — Telehealth: Payer: Self-pay | Admitting: Obstetrics & Gynecology

## 2024-08-18 NOTE — Telephone Encounter (Signed)
 I called patient to get her scheduled for an ultrasound & f/u with you in December. She stated she had ultrasound at [facility]. It is in care everywhere. Please advise.

## 2024-09-15 MED ORDER — METFORMIN HCL 1000 MG PO TABS: 1000.0000 mg | ORAL_TABLET | Freq: Two times a day (BID) | ORAL | 3 refills | Status: AC

## 2024-09-15 NOTE — Addendum Note (Signed)
 Addended by: JAYNE VONN DEL on: 09/15/2024 04:19 PM   Modules accepted: Orders

## 2024-10-03 MED ORDER — CYCLOBENZAPRINE HCL 10 MG PO TABS: 10.0000 mg | ORAL_TABLET | Freq: Three times a day (TID) | ORAL | 1 refills | Status: AC | PRN

## 2024-10-03 NOTE — Addendum Note (Signed)
 Addended by: JAYNE VONN DEL on: 10/03/2024 08:53 PM   Modules accepted: Orders

## 2024-10-14 DIAGNOSIS — Z202 Contact with and (suspected) exposure to infections with a predominantly sexual mode of transmission: Secondary | ICD-10-CM | POA: Diagnosis not present

## 2024-10-17 ENCOUNTER — Ambulatory Visit: Admitting: Obstetrics & Gynecology

## 2024-11-18 ENCOUNTER — Encounter: Payer: Self-pay | Admitting: Obstetrics & Gynecology

## 2024-11-20 MED ORDER — LISINOPRIL 10 MG PO TABS: 10.0000 mg | ORAL_TABLET | Freq: Every day | ORAL | 11 refills | Status: AC
